# Patient Record
Sex: Female | Born: 1995 | Race: Black or African American | Hispanic: No | Marital: Single | Smoking: Never smoker
Health system: Southern US, Community
[De-identification: ages and names within clinical notes are randomized; demographics above are authoritative.]

## PROBLEM LIST (undated history)

## (undated) ENCOUNTER — Emergency Department (HOSPITAL_COMMUNITY): Admission: EM | Payer: Medicaid Other | Source: Home / Self Care

## (undated) ENCOUNTER — Inpatient Hospital Stay (HOSPITAL_COMMUNITY): Payer: Self-pay

## (undated) DIAGNOSIS — O98119 Syphilis complicating pregnancy, unspecified trimester: Secondary | ICD-10-CM

## (undated) DIAGNOSIS — A749 Chlamydial infection, unspecified: Secondary | ICD-10-CM

## (undated) DIAGNOSIS — I10 Essential (primary) hypertension: Secondary | ICD-10-CM

## (undated) DIAGNOSIS — R87612 Low grade squamous intraepithelial lesion on cytologic smear of cervix (LGSIL): Secondary | ICD-10-CM

## (undated) DIAGNOSIS — O10919 Unspecified pre-existing hypertension complicating pregnancy, unspecified trimester: Secondary | ICD-10-CM

## (undated) DIAGNOSIS — R87629 Unspecified abnormal cytological findings in specimens from vagina: Secondary | ICD-10-CM

## (undated) DIAGNOSIS — O139 Gestational [pregnancy-induced] hypertension without significant proteinuria, unspecified trimester: Secondary | ICD-10-CM

## (undated) HISTORY — DX: Syphilis complicating pregnancy, unspecified trimester: O98.119

## (undated) HISTORY — DX: Gestational (pregnancy-induced) hypertension without significant proteinuria, unspecified trimester: O13.9

## (undated) HISTORY — DX: Unspecified abnormal cytological findings in specimens from vagina: R87.629

## (undated) HISTORY — DX: Unspecified pre-existing hypertension complicating pregnancy, unspecified trimester: O10.919

## (undated) HISTORY — DX: Essential (primary) hypertension: I10

## (undated) HISTORY — DX: Low grade squamous intraepithelial lesion on cytologic smear of cervix (LGSIL): R87.612

## (undated) HISTORY — DX: Chlamydial infection, unspecified: A74.9

---
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
## 2016-08-04 ENCOUNTER — Emergency Department (HOSPITAL_COMMUNITY)
Admission: EM | Admit: 2016-08-04 | Discharge: 2016-08-04 | Disposition: A | Discharge status: Home / Self Care | Payer: Medicaid Other | Attending: Emergency Medicine | Admitting: Emergency Medicine

## 2016-08-04 ENCOUNTER — Emergency Department (HOSPITAL_COMMUNITY): Payer: Medicaid Other

## 2016-08-04 ENCOUNTER — Encounter (HOSPITAL_COMMUNITY): Payer: Self-pay | Admitting: Emergency Medicine

## 2016-08-04 DIAGNOSIS — J45909 Unspecified asthma, uncomplicated: Secondary | ICD-10-CM | POA: Diagnosis not present

## 2016-08-04 DIAGNOSIS — Z91010 Allergy to peanuts: Secondary | ICD-10-CM | POA: Insufficient documentation

## 2016-08-04 DIAGNOSIS — Y92410 Unspecified street and highway as the place of occurrence of the external cause: Secondary | ICD-10-CM | POA: Insufficient documentation

## 2016-08-04 DIAGNOSIS — Y999 Unspecified external cause status: Secondary | ICD-10-CM | POA: Diagnosis not present

## 2016-08-04 DIAGNOSIS — Y9389 Activity, other specified: Secondary | ICD-10-CM | POA: Diagnosis not present

## 2016-08-04 DIAGNOSIS — M545 Low back pain: Secondary | ICD-10-CM | POA: Diagnosis present

## 2016-08-04 DIAGNOSIS — R0789 Other chest pain: Secondary | ICD-10-CM | POA: Insufficient documentation

## 2016-08-04 LAB — I-STAT BETA HCG BLOOD, ED (MC, WL, AP ONLY)

## 2016-08-04 MED ORDER — IBUPROFEN 200 MG PO TABS
600.0000 mg | ORAL_TABLET | Freq: Once | ORAL | Status: AC
  Administered 2016-08-04: 600 mg via ORAL
  Filled 2016-08-04: qty 3

## 2016-08-04 MED ORDER — NAPROXEN 500 MG PO TABS: 500.0000 mg | ORAL_TABLET | Freq: Two times a day (BID) | ORAL | 0 refills | Status: DC

## 2016-08-04 MED ORDER — CYCLOBENZAPRINE HCL 5 MG PO TABS: 10.0000 mg | ORAL_TABLET | Freq: Three times a day (TID) | ORAL | 0 refills | Status: DC | PRN

## 2016-08-04 MED ORDER — CYCLOBENZAPRINE HCL 10 MG PO TABS
5.0000 mg | ORAL_TABLET | Freq: Once | ORAL | Status: AC
  Administered 2016-08-04: 5 mg via ORAL
  Filled 2016-08-04: qty 1

## 2016-08-04 MED ORDER — HYDROCODONE-ACETAMINOPHEN 5-325 MG PO TABS
1.0000 | ORAL_TABLET | Freq: Once | ORAL | Status: AC
  Administered 2016-08-04: 1 via ORAL
  Filled 2016-08-04: qty 1

## 2016-08-04 NOTE — Discharge Instructions (Signed)
Read the information below.  Your x-rays were re-assuring. You may have increasing muscle soreness over the next 2-3 days. I have prescribed naprosyn and flexeril. While taking naprosyn do not take motrin, ibuprofen, or aleve. The flexeril can make you drowsy, do not drive after taking.  Use the prescribed medication as directed.  Please discuss all new medications with your pharmacist.   Follow up with your primary doctor if symptoms persist.  You may return to the Emergency Department at any time for worsening condition or any new symptoms that concern you. Return to ED if you develop changes in vision, vomiting, confusion/lethargy, weakness, or loss of sensation or any other new or concerning sxs.
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
## 2016-10-09 ENCOUNTER — Encounter (HOSPITAL_COMMUNITY)
Admission: RE | Admit: 2016-10-09 | Discharge: 2016-10-09 | Disposition: A | Discharge status: Home / Self Care | Payer: Medicaid Other | Source: Ambulatory Visit | Attending: Oral Surgery | Admitting: Oral Surgery

## 2016-10-09 ENCOUNTER — Encounter (HOSPITAL_COMMUNITY): Payer: Self-pay

## 2016-10-09 DIAGNOSIS — K011 Impacted teeth: Secondary | ICD-10-CM | POA: Insufficient documentation

## 2016-10-09 DIAGNOSIS — Z01812 Encounter for preprocedural laboratory examination: Secondary | ICD-10-CM | POA: Insufficient documentation

## 2016-10-09 LAB — CBC
HEMATOCRIT: 38.4 % (ref 36.0–46.0)
HEMOGLOBIN: 12.7 g/dL (ref 12.0–15.0)
MCH: 28.7 pg (ref 26.0–34.0)
MCHC: 33.1 g/dL (ref 30.0–36.0)
MCV: 86.9 fL (ref 78.0–100.0)
Platelets: 269 10*3/uL (ref 150–400)
RBC: 4.42 MIL/uL (ref 3.87–5.11)
RDW: 13.2 % (ref 11.5–15.5)
WBC: 11.9 10*3/uL — AB (ref 4.0–10.5)

## 2016-10-09 LAB — HCG, SERUM, QUALITATIVE: Preg, Serum: NEGATIVE

## 2016-10-09 NOTE — H&P (Signed)
HISTORY AND PHYSICAL  Brandy Brock is a 20 y.o. female patient with CC: painful wisdom teeth  No diagnosis found.  Past Medical History:  Diagnosis Date  . Asthma     No current facility-administered medications for this encounter.    Current Outpatient Prescriptions  Medication Sig Dispense Refill  . cyclobenzaprine (FLEXERIL) 5 MG tablet Take 2 tablets (10 mg total) by mouth 3 (three) times daily as needed for muscle spasms. (Patient not taking: Reported on 10/01/2016) 15 tablet 0  . naproxen (NAPROSYN) 500 MG tablet Take 1 tablet (500 mg total) by mouth 2 (two) times daily. (Patient not taking: Reported on 10/01/2016) 16 tablet 0   Allergies  Allergen Reactions  . Peanut-Containing Drug Products Itching and Swelling    REACTION: Facial itching and swelling.   Active Problems:   * No active hospital problems. *  Vitals: There were no vitals taken for this visit. Lab results:No results found for this or any previous visit (from the past 80 hour(s)). Radiology Results: No results found. General appearance: alert, cooperative and morbidly obese Head: Normocephalic, without obvious abnormality, atraumatic Eyes: conjunctivae/corneas clear. PERRL, EOM's intact. Fundi benign. Nose: Nares normal. Septum midline. Mucosa normal. No drainage or sinus tenderness. Throat: lips, mucosa, and tongue normal; teeth and gums normal and impacted teeth 1, 16, 17, 32 Neck: no adenopathy, supple, symmetrical, trachea midline and thyroid not enlarged, symmetric, no tenderness/mass/nodules Resp: clear to auscultation bilaterally Cardio: regular rate and rhythm, S1, S2 normal, no murmur, click, rub or gallop  Assessment: Impacted teeth 1, 16, 17, 32, morbid obesity  Plan: extract teeth 1, 16, 17, 32. GA. Day surghery.   Gae Bon 10/09/2016

## 2016-10-09 NOTE — Pre-Procedure Instructions (Signed)
Brandy Brock  10/09/2016      [facility] [place], [place] - [address] [place] [address] Phone: [phone] Fax: [phone]  [id number] - [place], [place] [name] [place] [address] Phone: [phone] Fax: [phone]    Your procedure is scheduled on Friday December 1.  Report to [facility] Admitting at 6:45 A.M.  Call this number if you have problems the morning of surgery:  [phone]   Remember:  Do not eat food or drink liquids after midnight.  Take these medicines the morning of surgery with A SIP OF WATER: NONE  7 days prior to surgery STOP taking any Aspirin, Aleve, Naproxen, Ibuprofen, Motrin, Advil, Goody's, BC's, all herbal medications, fish oil, and all vitamins    Do not wear jewelry, make-up or nail polish.  Do not wear lotions, powders, or perfumes, or deoderant.  Do not shave 48 hours prior to surgery.  Men may shave face and neck.  Do not bring valuables to the hospital.  [facility] is not responsible for any belongings or valuables.  Contacts, dentures or bridgework may not be worn into surgery.  Leave your suitcase in the car.  After surgery it may be brought to your room.  For patients admitted to the hospital, discharge time will be determined by your treatment team.  Patients discharged the day of surgery will not be allowed to drive home.    Special instructions:    [facility]- Preparing For Surgery  Before surgery, you can play an important role. Because skin is not sterile, your skin needs to be as free of germs as possible. You can reduce the number of germs on your skin by washing with CHG (chlorahexidine gluconate) Soap before surgery.  CHG is an antiseptic cleaner which kills germs and bonds with the skin to continue killing germs even after washing.  Please do not use if you have an allergy to CHG or antibacterial soaps.  If your skin becomes reddened/irritated stop using the CHG.  Do not shave (including legs and underarms) for at least 48 hours prior to first CHG shower. It is OK to shave your face.  Please follow these instructions carefully.   1. Shower the NIGHT BEFORE SURGERY and the MORNING OF SURGERY with CHG.   2. If you chose to wash your hair, wash your hair first as usual with your normal shampoo.  3. After you shampoo, rinse your hair and body thoroughly to remove the shampoo.  4. Use CHG as you would any other liquid soap. You can apply CHG directly to the skin and wash gently with a scrungie or a clean washcloth.   5. Apply the CHG Soap to your body ONLY FROM THE NECK DOWN.  Do not use on open wounds or open sores. Avoid contact with your eyes, ears, mouth and genitals (private parts). Wash genitals (private parts) with your normal soap.  6. Wash thoroughly, paying special attention to the area where your surgery will be performed.  7. Thoroughly rinse your body with warm water from the neck down.  8. DO NOT shower/wash with your normal soap after using and rinsing off the CHG Soap.  9. Pat yourself dry with a CLEAN TOWEL.   10. Wear CLEAN PAJAMAS   11. Place CLEAN SHEETS on your bed the night of your first shower and DO NOT SLEEP WITH PETS.    Day of Surgery:  Do not apply any deodorants/lotions. Please wear clean clothes to the hospital/surgery center.

## 2016-10-09 NOTE — Progress Notes (Signed)
No PCP  denies cardiac hx, chest pain, SOB or signs of infection at PAT appointment.

## 2016-10-11 ENCOUNTER — Ambulatory Visit (HOSPITAL_COMMUNITY): Payer: Medicaid Other | Admitting: Certified Registered Nurse Anesthetist

## 2016-10-11 ENCOUNTER — Encounter (HOSPITAL_COMMUNITY)
Admission: RE | Disposition: A | Discharge status: Home / Self Care | Payer: Self-pay | Source: Ambulatory Visit | Attending: Oral Surgery

## 2016-10-11 ENCOUNTER — Encounter (HOSPITAL_COMMUNITY): Payer: Self-pay | Admitting: *Deleted

## 2016-10-11 ENCOUNTER — Ambulatory Visit (HOSPITAL_COMMUNITY)
Admission: RE | Admit: 2016-10-11 | Discharge: 2016-10-11 | Disposition: A | Discharge status: Home / Self Care | Payer: Medicaid Other | Source: Ambulatory Visit | Attending: Oral Surgery | Admitting: Oral Surgery

## 2016-10-11 DIAGNOSIS — J45909 Unspecified asthma, uncomplicated: Secondary | ICD-10-CM | POA: Insufficient documentation

## 2016-10-11 DIAGNOSIS — K011 Impacted teeth: Secondary | ICD-10-CM | POA: Insufficient documentation

## 2016-10-11 DIAGNOSIS — Z91010 Allergy to peanuts: Secondary | ICD-10-CM | POA: Diagnosis not present

## 2016-10-11 DIAGNOSIS — Z6841 Body Mass Index (BMI) 40.0 and over, adult: Secondary | ICD-10-CM | POA: Insufficient documentation

## 2016-10-11 HISTORY — PX: MULTIPLE EXTRACTIONS WITH ALVEOLOPLASTY: SHX5342

## 2016-10-11 SURGERY — MULTIPLE EXTRACTION WITH ALVEOLOPLASTY
Anesthesia: General | Site: Mouth

## 2016-10-11 MED ORDER — LIDOCAINE-EPINEPHRINE 2 %-1:100000 IJ SOLN
INTRAMUSCULAR | Status: AC
  Filled 2016-10-11: qty 1

## 2016-10-11 MED ORDER — PROPOFOL 10 MG/ML IV BOLUS
INTRAVENOUS | Status: AC
  Filled 2016-10-11: qty 20

## 2016-10-11 MED ORDER — HYDROCODONE-ACETAMINOPHEN 5-325 MG PO TABS: 1.0000 | ORAL_TABLET | Freq: Four times a day (QID) | ORAL | 0 refills | Status: DC | PRN

## 2016-10-11 MED ORDER — ONDANSETRON HCL 4 MG/2ML IJ SOLN
INTRAMUSCULAR | Status: DC | PRN
  Administered 2016-10-11: 4 mg via INTRAVENOUS

## 2016-10-11 MED ORDER — DEXMEDETOMIDINE HCL 200 MCG/2ML IV SOLN
INTRAVENOUS | Status: DC | PRN
  Administered 2016-10-11: 12 ug via INTRAVENOUS

## 2016-10-11 MED ORDER — LACTATED RINGERS IV SOLN
INTRAVENOUS | Status: DC
  Administered 2016-10-11: 08:00:00 via INTRAVENOUS

## 2016-10-11 MED ORDER — FENTANYL CITRATE (PF) 100 MCG/2ML IJ SOLN
INTRAMUSCULAR | Status: DC | PRN
  Administered 2016-10-11 (×2): 100 ug via INTRAVENOUS

## 2016-10-11 MED ORDER — OXYMETAZOLINE HCL 0.05 % NA SOLN
NASAL | Status: AC
  Filled 2016-10-11: qty 15

## 2016-10-11 MED ORDER — MIDAZOLAM HCL 2 MG/2ML IJ SOLN
INTRAMUSCULAR | Status: AC
  Filled 2016-10-11: qty 2

## 2016-10-11 MED ORDER — LIDOCAINE HCL (CARDIAC) 20 MG/ML IV SOLN
INTRAVENOUS | Status: DC | PRN
  Administered 2016-10-11: 100 mg via INTRAVENOUS

## 2016-10-11 MED ORDER — FENTANYL CITRATE (PF) 100 MCG/2ML IJ SOLN
INTRAMUSCULAR | Status: AC
  Filled 2016-10-11: qty 2

## 2016-10-11 MED ORDER — SUCCINYLCHOLINE CHLORIDE 20 MG/ML IJ SOLN
INTRAMUSCULAR | Status: DC | PRN
  Administered 2016-10-11: 200 mg via INTRAVENOUS

## 2016-10-11 MED ORDER — LIDOCAINE-EPINEPHRINE 2 %-1:100000 IJ SOLN
INTRAMUSCULAR | Status: DC | PRN
  Administered 2016-10-11: 16 mL

## 2016-10-11 MED ORDER — 0.9 % SODIUM CHLORIDE (POUR BTL) OPTIME
TOPICAL | Status: DC | PRN
  Administered 2016-10-11: 1000 mL

## 2016-10-11 MED ORDER — PROPOFOL 10 MG/ML IV BOLUS
INTRAVENOUS | Status: DC | PRN
  Administered 2016-10-11: 150 mg via INTRAVENOUS
  Administered 2016-10-11: 50 mg via INTRAVENOUS

## 2016-10-11 MED ORDER — MIDAZOLAM HCL 5 MG/5ML IJ SOLN
INTRAMUSCULAR | Status: DC | PRN
  Administered 2016-10-11: 2 mg via INTRAVENOUS

## 2016-10-11 SURGICAL SUPPLY — 32 items
BUR CROSS CUT FISSURE 1.6 (BURR) ×2 IMPLANT
BUR CROSS CUT FISSURE 1.6MM (BURR) ×1
BUR EGG ELITE 4.0 (BURR) ×2 IMPLANT
BUR EGG ELITE 4.0MM (BURR) ×1
CANISTER SUCTION 2500CC (MISCELLANEOUS) ×3 IMPLANT
COVER SURGICAL LIGHT HANDLE (MISCELLANEOUS) ×3 IMPLANT
CRADLE DONUT ADULT HEAD (MISCELLANEOUS) ×3 IMPLANT
DECANTER SPIKE VIAL GLASS SM (MISCELLANEOUS) IMPLANT
DRAPE U-SHAPE 76X120 STRL (DRAPES) IMPLANT
FLUID NSS /IRRIG 1000 ML XXX (MISCELLANEOUS) ×3 IMPLANT
GAUZE PACKING FOLDED 2  STR (GAUZE/BANDAGES/DRESSINGS) ×2
GAUZE PACKING FOLDED 2 STR (GAUZE/BANDAGES/DRESSINGS) ×1 IMPLANT
GLOVE BIO SURGEON STRL SZ 6.5 (GLOVE) ×2 IMPLANT
GLOVE BIO SURGEON STRL SZ7.5 (GLOVE) ×3 IMPLANT
GLOVE BIO SURGEONS STRL SZ 6.5 (GLOVE) ×1
GLOVE BIOGEL PI IND STRL 7.0 (GLOVE) ×1 IMPLANT
GLOVE BIOGEL PI INDICATOR 7.0 (GLOVE) ×2
GOWN STRL REUS W/ TWL LRG LVL3 (GOWN DISPOSABLE) ×1 IMPLANT
GOWN STRL REUS W/ TWL XL LVL3 (GOWN DISPOSABLE) ×1 IMPLANT
GOWN STRL REUS W/TWL LRG LVL3 (GOWN DISPOSABLE) ×3
GOWN STRL REUS W/TWL XL LVL3 (GOWN DISPOSABLE) ×3
KIT BASIN OR (CUSTOM PROCEDURE TRAY) ×3 IMPLANT
KIT ROOM TURNOVER OR (KITS) ×3 IMPLANT
NEEDLE 22X1 1/2 (OR ONLY) (NEEDLE) ×6 IMPLANT
NS IRRIG 1000ML POUR BTL (IV SOLUTION) ×3 IMPLANT
PAD ARMBOARD 7.5X6 YLW CONV (MISCELLANEOUS) ×3 IMPLANT
SUT CHROMIC 3 0 PS 2 (SUTURE) ×6 IMPLANT
SYR CONTROL 10ML LL (SYRINGE) ×3 IMPLANT
TOWEL OR 17X26 10 PK STRL BLUE (TOWEL DISPOSABLE) ×3 IMPLANT
TRAY ENT MC OR (CUSTOM PROCEDURE TRAY) ×3 IMPLANT
TUBING IRRIGATION (MISCELLANEOUS) ×3 IMPLANT
YANKAUER SUCT BULB TIP NO VENT (SUCTIONS) ×3 IMPLANT

## 2016-10-11 NOTE — Transfer of Care (Signed)
Immediate Anesthesia Transfer of Care Note  Patient: Brandy Brock  Procedure(s) Performed: Procedure(s): MULTIPLE EXTRACTION WITH ALVEOLOPLASTY (N/A)  Patient Location: PACU  Anesthesia Type:General  Level of Consciousness: awake, alert , oriented and patient cooperative  Airway & Oxygen Therapy: Patient Spontanous Breathing and Patient connected to face mask oxygen  Post-op Assessment: Report given to RN and Post -op Vital signs reviewed and stable  Post vital signs: Reviewed and stable  Last Vitals:  Vitals:   10/11/16 0711 10/11/16 0941  BP: (!) 157/89   Pulse: 77 (!) 108  Resp: 20 (!) 24  Temp: 36.7 C 36.2 C    Last Pain:  Vitals:   10/11/16 0941  TempSrc:   PainSc: 0-No pain         Complications: No apparent anesthesia complications

## 2016-10-11 NOTE — Anesthesia Procedure Notes (Addendum)
Procedure Name: MAC Date/Time: 10/11/2016 8:54 AM Performed by: Shirlyn Goltz Pre-anesthesia Checklist: Patient identified, Emergency Drugs available, Suction available and Patient being monitored Patient Re-evaluated:Patient Re-evaluated prior to inductionOxygen Delivery Method: Circle system utilized Preoxygenation: Pre-oxygenation with 100% oxygen Intubation Type: IV induction Ventilation: Mask ventilation without difficulty Laryngoscope Size: Mac and 3 Grade View: Grade I Tube type: Oral Tube size: 7.0 mm Number of attempts: 1 Placement Confirmation: ETT inserted through vocal cords under direct vision and breath sounds checked- equal and bilateral Secured at: 22 cm Tube secured with: Tape Dental Injury: Teeth and Oropharynx as per pre-operative assessment

## 2016-10-11 NOTE — Anesthesia Postprocedure Evaluation (Signed)
Anesthesia Post Note  Patient: Brandy Brock  Procedure(s) Performed: Procedure(s) (LRB): MULTIPLE EXTRACTION WITH ALVEOLOPLASTY (N/A)  Patient location during evaluation: PACU Anesthesia Type: General Level of consciousness: awake and alert Pain management: pain level controlled Vital Signs Assessment: post-procedure vital signs reviewed and stable Respiratory status: spontaneous breathing, nonlabored ventilation, respiratory function stable and patient connected to nasal cannula oxygen Cardiovascular status: blood pressure returned to baseline and stable Postop Assessment: no signs of nausea or vomiting Anesthetic complications: no    Last Vitals:  Vitals:   10/11/16 1030 10/11/16 1042  BP: (!) 136/91 (!) 135/92  Pulse: 94 89  Resp: 14   Temp: 36.4 C     Last Pain:  Vitals:   10/11/16 1030  TempSrc:   PainSc: 0-No pain                 Felipe Paluch DAVID

## 2016-10-11 NOTE — Anesthesia Preprocedure Evaluation (Signed)
Anesthesia Evaluation  Patient identified by MRN, date of birth, ID band Patient awake    Reviewed: Allergy & Precautions, NPO status , Patient's Chart, lab work & pertinent test results  Airway Mallampati: I  TM Distance: >3 FB Neck ROM: Full    Dental   Pulmonary asthma ,    Pulmonary exam normal        Cardiovascular Normal cardiovascular exam     Neuro/Psych    GI/Hepatic   Endo/Other    Renal/GU      Musculoskeletal   Abdominal   Peds  Hematology   Anesthesia Other Findings   Reproductive/Obstetrics                             Anesthesia Physical Anesthesia Plan  ASA: II  Anesthesia Plan: General   Post-op Pain Management:    Induction: Intravenous  Airway Management Planned: Oral ETT  Additional Equipment:   Intra-op Plan:   Post-operative Plan: Extubation in OR  Informed Consent: I have reviewed the patients History and Physical, chart, labs and discussed the procedure including the risks, benefits and alternatives for the proposed anesthesia with the patient or authorized representative who has indicated his/her understanding and acceptance.     Plan Discussed with: CRNA and Surgeon  Anesthesia Plan Comments:         Anesthesia Quick Evaluation

## 2016-10-11 NOTE — H&P (Signed)
H&P documentation  -History and Physical Reviewed  -Patient has been re-examined  -No change in the plan of care  Brandy Brock M  

## 2016-10-11 NOTE — Op Note (Signed)
10/11/2016  9:20 AM  PATIENT:  Brandy Brock  20 y.o. female  PRE-OPERATIVE DIAGNOSIS:  Impacted teeth # 1, 16, 17, 32  POST-OPERATIVE DIAGNOSIS:  SAME  PROCEDURE:  Procedure(s): Extraction teeth 1, 16, 17, 32  SURGEON:  Surgeon(s): Diona Browner, DDS  ANESTHESIA:   local and general  EBL:  minimal  DRAINS: none   SPECIMEN:  No Specimen  COUNTS:  YES  PLAN OF CARE: Discharge to home after PACU  PATIENT DISPOSITION:  PACU - hemodynamically stable.   PROCEDURE DETAILS: Dictation # [id number]  Gae Bon, DMD 10/11/2016 9:20 AM

## 2016-10-12 ENCOUNTER — Encounter (HOSPITAL_COMMUNITY): Payer: Self-pay | Admitting: Oral Surgery

## 2016-10-14 NOTE — Op Note (Signed)
NAMECALIXTA, Brandy Brock NO.:  [id number]  MEDICAL RECORD NO.:  [id number]  LOCATION:                                 FACILITY:  PHYSICIAN:  Gae Bon, M.D.  DATE OF BIRTH:  [birth year]  DATE OF PROCEDURE:  10/11/2016 DATE OF DISCHARGE:                              OPERATIVE REPORT   PREOPERATIVE DIAGNOSIS:  Impacted wisdom teeth numbers 1, 16, 17, 32.  POSTOPERATIVE DIAGNOSIS:  Impacted wisdom teeth numbers 1, 16, 17, 32.  PROCEDURE:  Extraction of teeth #1, #16, #17, #32.  SURGEON:  Gae Bon, M.D.  ANESTHESIA:  General oral intubation.  Dr. Conrad [place], attending.  DESCRIPTION OF PROCEDURE:  The patient was taken to the operating room, placed on the table in supine position.  General anesthesia was administered intravenously and oral endotracheal tube was placed and secured.  The eyes were protected.  The patient was draped for the procedure.  Time-out was performed.  The posterior pharynx was suctioned.  A throat pack was placed.  A 2% lidocaine with 1:100,000 epinephrine was infiltrated in an inferior alveolar block on the right and left side and in buccal and palatal infiltration in the maxilla posteriorly around teeth numbers 1 and 16.  A total of 16 mL of anesthetic solution was utilized.  A bite block was placed in the right side of the mouth and a sweetheart was used to retract the tongue.  A 15 blade was used to make incisions over teeth numbers 16 and 17.  The periosteum was reflected to expose the bone and the teeth.  Then, bone was removed from around the teeth using a Stryker handpiece under irrigation.  Tooth #17 was luxated, but was not removed.  The tooth required additional sectioning with a Stryker handpiece to remove the tooth in pieces with a 301 elevator.  The maxillary tooth was elevated with a 301 elevator.  Both teeth having been removed, then the sockets were curetted, irrigated, and closed with 3-0 chromic.  The  endotracheal tube was repositioned to the left side of the mouth and retractor and bite block were then also repositioned left side as well.  Access was then available for the teeth numbers 1 and 32.  The 15 blade was used to make an incision overlying the teeth, then the periosteum was reflected to expose the teeth and bone.  The Stryker handpiece was used to remove bone overlying teeth numbers 1 and 32.  Tooth #32 required additional suctioning and was removed with the 301 elevator and then the [facility] elevator was used to remove the mesial roots.  In the maxilla, the 301 elevator was used to elevate and extract the tooth.  The sockets were curetted, irrigated, and closed with 3-0 chromic.  The oral cavity was then irrigated and suctioned.  Throat pack was removed.  The patient was awakened, taken to the recovery room, breathing spontaneously in good condition.  ESTIMATED BLOOD LOSS:  Minimal.  COMPLICATIONS:  None.  SPECIMENS:  None.     Gae Bon, M.D.   ______________________________ Gae Bon, M.D.    SMJ/MEDQ  D:  10/11/2016  T:  10/12/2016  Job:  [id number] 

## 2017-03-04 ENCOUNTER — Encounter (HOSPITAL_COMMUNITY): Payer: Self-pay | Admitting: Family Medicine

## 2017-03-04 ENCOUNTER — Ambulatory Visit (HOSPITAL_COMMUNITY)
Admission: EM | Admit: 2017-03-04 | Discharge: 2017-03-04 | Disposition: A | Discharge status: Home / Self Care | Payer: Medicaid Other | Attending: Internal Medicine | Admitting: Internal Medicine

## 2017-03-04 DIAGNOSIS — J302 Other seasonal allergic rhinitis: Secondary | ICD-10-CM

## 2017-03-04 MED ORDER — MONTELUKAST SODIUM 10 MG PO TABS: 10.0000 mg | ORAL_TABLET | Freq: Every day | ORAL | 2 refills | Status: DC

## 2017-03-04 NOTE — Discharge Instructions (Signed)
You have seasonal allergies, recommend over-the-counter Claritin or Zyrtec every day for the remainder of the season, Flonase 2 sprays each nostril every day as well, started on a medicine called Singulair, take one tablet every night at bedtime.

## 2017-03-04 NOTE — ED Triage Notes (Signed)
Pt here for congestion and sore throat x 1 week.Marland Kitchen

## 2017-03-04 NOTE — ED Provider Notes (Signed)
CSN: [id number]     Arrival date & time 03/04/17  1132 History   None    Chief Complaint  Patient presents with  . Nasal Congestion   (Consider location/radiation/quality/duration/timing/severity/associated sxs/prior Treatment) The history is provided by the patient.  URI  Presenting symptoms: congestion, cough, rhinorrhea and sore throat   Presenting symptoms: no fatigue and no fever   Congestion:    Location:  Nasal   Interferes with sleep: no     Interferes with eating/drinking: no   Cough:    Cough characteristics:  Non-productive, dry and hacking   Sputum characteristics:  Clear   Severity:  Moderate   Onset quality:  Gradual   Duration:  2 weeks   Timing:  Constant Rhinorrhea:    Quality:  Clear   Severity:  Moderate   Duration:  2 weeks   Timing:  Constant   Progression:  Unchanged Severity:  Mild Onset quality:  Gradual Duration:  2 weeks Timing:  Constant Chronicity:  New Relieved by:  None tried Worsened by:  Nothing Ineffective treatments:  None tried Associated symptoms: sneezing   Associated symptoms: no sinus pain and no wheezing   Risk factors: sick contacts     Past Medical History:  Diagnosis Date  . Asthma    childhood   Past Surgical History:  Procedure Laterality Date  . MULTIPLE EXTRACTIONS WITH ALVEOLOPLASTY N/A 10/11/2016   Procedure: MULTIPLE EXTRACTION WITH ALVEOLOPLASTY;  Surgeon: Diona Browner, DDS;  Location: [place];  Service: Oral Surgery;  Laterality: N/A;   History reviewed. No pertinent family history. Social History  Substance Use Topics  . Smoking status: Never Smoker  . Smokeless tobacco: Former Systems developer  . Alcohol use No   OB History    No data available     Review of Systems  Constitutional: Negative for chills, fatigue and fever.  HENT: Positive for congestion, rhinorrhea, sneezing and sore throat. Negative for sinus pain and sinus pressure.   Eyes: Positive for itching. Negative for pain and redness.  Respiratory:  Positive for cough. Negative for shortness of breath and wheezing.   Cardiovascular: Negative.   Gastrointestinal: Negative for constipation, diarrhea, nausea and vomiting.  Genitourinary: Negative.   Musculoskeletal: Negative.   Skin: Negative.   Neurological: Negative.     Allergies  Peanut-containing drug products  Home Medications   Prior to Admission medications   Medication Sig Start Date End Date Taking? Authorizing Provider  montelukast (SINGULAIR) 10 MG tablet Take 1 tablet (10 mg total) by mouth at bedtime. 03/04/17   Barnet Glasgow, NP   Meds Ordered and Administered this Visit  Medications - No data to display  BP (!) 143/82   Pulse 94   Temp 98.4 F (36.9 C)   Resp 18   LMP 03/04/2017   SpO2 96%  No data found.   Physical Exam  Constitutional: She is oriented to person, place, and time. She appears well-developed and well-nourished. No distress.  HENT:  Head: Normocephalic and atraumatic.  Right Ear: Tympanic membrane and external ear normal.  Left Ear: Tympanic membrane and external ear normal.  Nose: Mucosal edema and rhinorrhea present. Right sinus exhibits no maxillary sinus tenderness and no frontal sinus tenderness. Left sinus exhibits no maxillary sinus tenderness and no frontal sinus tenderness.  Eyes: Conjunctivae and lids are normal. Right eye exhibits no discharge. Left eye exhibits no discharge.  Neck: Normal range of motion.  Cardiovascular: Normal rate and regular rhythm.   Pulmonary/Chest: Effort normal and breath sounds  normal.  Abdominal: Soft. Bowel sounds are normal.  Neurological: She is alert and oriented to person, place, and time.  Skin: Skin is warm and dry. Capillary refill takes less than 2 seconds. She is not diaphoretic.  Psychiatric: She has a normal mood and affect. Her behavior is normal.  Nursing note and vitals reviewed.   Urgent Care Course     Procedures (including critical care time)  Labs Review Labs Reviewed -  No data to display  Imaging Review No results found.   MDM   1. Seasonal allergic rhinitis, unspecified trigger    Treating for seasonal allergies, recommend over-the-counter Flonase, Zyrtec, daily given prescription for Singulair, follow-up with primary care return to clinic as needed.      Barnet Glasgow, NP 03/04/17 1250

## 2017-07-01 ENCOUNTER — Ambulatory Visit (HOSPITAL_COMMUNITY)
Admission: EM | Admit: 2017-07-01 | Discharge: 2017-07-01 | Disposition: A | Discharge status: Home / Self Care | Payer: Medicaid Other | Attending: Family Medicine | Admitting: Family Medicine

## 2017-07-01 ENCOUNTER — Encounter (HOSPITAL_COMMUNITY): Payer: Self-pay | Admitting: Emergency Medicine

## 2017-07-01 DIAGNOSIS — J069 Acute upper respiratory infection, unspecified: Secondary | ICD-10-CM

## 2017-07-01 DIAGNOSIS — B9789 Other viral agents as the cause of diseases classified elsewhere: Secondary | ICD-10-CM | POA: Diagnosis not present

## 2017-07-01 MED ORDER — BENZONATATE 100 MG PO CAPS: 100.0000 mg | ORAL_CAPSULE | Freq: Three times a day (TID) | ORAL | 0 refills | Status: DC

## 2017-07-01 MED ORDER — FLUTICASONE PROPIONATE 50 MCG/ACT NA SUSP: 2.0000 | Freq: Every day | NASAL | 0 refills | Status: DC

## 2017-07-01 MED ORDER — CETIRIZINE-PSEUDOEPHEDRINE ER 5-120 MG PO TB12: 1.0000 | ORAL_TABLET | Freq: Every day | ORAL | 0 refills | Status: DC

## 2017-07-01 NOTE — ED Provider Notes (Signed)
[facility]    CSN: [id number] Arrival date & time: 07/01/17  5366     History   Chief Complaint Chief Complaint  Patient presents with  . URI    HPI Brandy Brock is a 21 y.o. female.   21 year old female with history of childhood asthma comes in for one-week history of sore throat, cough, nasal congestion, nosebleeds. Patient states she now has had chest pain while coughing, with shortness of breath. She has taken some ibuprofen for the pain without relief. She has had 2-3 episodes of nosebleeds, which were controlled with pressure. Denies headache, ear pain, eye pain. Denies abdominal pain, nausea, vomiting, diarrhea, constipation. Had a fever with Tmax of 101. Denies chills or night sweats. Denies wheezing, and has not needed use of inhaler. Positive sick contact.      Past Medical History:  Diagnosis Date  . Asthma    childhood    Patient Active Problem List   Diagnosis Date Noted  . Obesity, unspecified 10/13/2008  . ASTHMA, PERSISTENT 10/13/2008    Past Surgical History:  Procedure Laterality Date  . MULTIPLE EXTRACTIONS WITH ALVEOLOPLASTY N/A 10/11/2016   Procedure: MULTIPLE EXTRACTION WITH ALVEOLOPLASTY;  Surgeon: Diona Browner, DDS;  Location: [facility];  Service: Oral Surgery;  Laterality: N/A;    OB History    No data available       Home Medications    Prior to Admission medications   Medication Sig Start Date End Date Taking? Authorizing Provider  benzonatate (TESSALON) 100 MG capsule Take 1 capsule (100 mg total) by mouth every 8 (eight) hours. 07/01/17   Tasia Catchings, Lattie Cervi V, PA-C  cetirizine-pseudoephedrine (ZYRTEC-D) 5-120 MG tablet Take 1 tablet by mouth daily. 07/01/17   Tasia Catchings, Anabell Swint V, PA-C  fluticasone (FLONASE) 50 MCG/ACT nasal spray Place 2 sprays into both nostrils daily. 07/01/17   Ok Edwards, PA-C    Family History No family history on file.  Social History Social History  Substance Use Topics  . Smoking status: Never Smoker  . Smokeless  tobacco: Former Systems developer  . Alcohol use No     Allergies   Peanut-containing drug products   Review of Systems Review of Systems  Reason unable to perform ROS: see HPI as above.     Physical Exam Triage Vital Signs ED Triage Vitals  Enc Vitals Group     BP 07/01/17 1011 (!) 153/95     Pulse Rate 07/01/17 1011 98     Resp 07/01/17 1011 16     Temp 07/01/17 1011 97.9 F (36.6 C)     Temp Source 07/01/17 1011 Oral     SpO2 07/01/17 1011 98 %     Weight 07/01/17 1011 (!) 375 lb (170.1 kg)     Height 07/01/17 1011 5\' 6"  (1.676 m)     Head Circumference --      Peak Flow --      Pain Score 07/01/17 1012 7     Pain Loc --      Pain Edu? --      Excl. in [place]? --    No data found.   Updated Vital Signs BP (!) 153/95 (BP Location: Left Wrist)   Pulse 98   Temp 97.9 F (36.6 C) (Oral)   Resp 16   Ht 5\' 6"  (1.676 m)   Wt (!) 350 lb (158.8 kg)   SpO2 98%   BMI 56.49 kg/m    Physical Exam  Constitutional: She is oriented to person,  place, and time. She appears well-developed and well-nourished. No distress.  HENT:  Head: Normocephalic and atraumatic.  Right Ear: External ear and ear canal normal. Tympanic membrane is not erythematous and not bulging. A middle ear effusion is present.  Left Ear: External ear and ear canal normal. Tympanic membrane is not erythematous and not bulging. A middle ear effusion is present.  Nose: Mucosal edema and rhinorrhea present. Right sinus exhibits no maxillary sinus tenderness and no frontal sinus tenderness. Left sinus exhibits no maxillary sinus tenderness and no frontal sinus tenderness.  Mouth/Throat: Uvula is midline and mucous membranes are normal. Posterior oropharyngeal erythema present.  Eyes: Pupils are equal, round, and reactive to light. Conjunctivae are normal.  Neck: Normal range of motion. Neck supple.  Cardiovascular: Normal rate, regular rhythm and normal heart sounds.  Exam reveals no gallop and no friction rub.   No  murmur heard. Pulmonary/Chest: Effort normal and breath sounds normal. She has no decreased breath sounds. She has no wheezes. She has no rhonchi. She has no rales.  Lymphadenopathy:    She has no cervical adenopathy.  Neurological: She is alert and oriented to person, place, and time.  Skin: Skin is warm and dry.  Psychiatric: She has a normal mood and affect. Her behavior is normal. Judgment normal.     UC Treatments / Results  Labs (all labs ordered are listed, but only abnormal results are displayed) Labs Reviewed - No data to display  EKG  EKG Interpretation None       Radiology No results found.  Procedures Procedures (including critical care time)  Medications Ordered in UC Medications - No data to display   Initial Impression / Assessment and Plan / UC Course  I have reviewed the triage vital signs and the nursing notes.  Pertinent labs & imaging results that were available during my care of the patient were reviewed by me and considered in my medical decision making (see chart for details).    Discussed with patient history and exam most consistent with viral URI. Lung exam normal without adventitious lung sounds today. Will treat symptomatically. Return precautions given.  Final Clinical Impressions(s) / UC Diagnoses   Final diagnoses:  Viral URI with cough    New Prescriptions New Prescriptions   BENZONATATE (TESSALON) 100 MG CAPSULE    Take 1 capsule (100 mg total) by mouth every 8 (eight) hours.   CETIRIZINE-PSEUDOEPHEDRINE (ZYRTEC-D) 5-120 MG TABLET    Take 1 tablet by mouth daily.   FLUTICASONE (FLONASE) 50 MCG/ACT NASAL SPRAY    Place 2 sprays into both nostrils daily.      Ok Edwards, PA-C 07/01/17 1032

## 2017-07-01 NOTE — ED Triage Notes (Signed)
PT reports nasal congestion, cough, sore throat for 1 week. PT reports she had multiple nose bleeds yesterday. PT has not used any nasal sprays.

## 2017-07-01 NOTE — Discharge Instructions (Signed)
Start Tessalon for cough. Flonase and Zyrtec-D for nasal congestion. Can use over-the-counter nasal saline rinse such as neti pot for nasal congestion. Humidifier or steam showers for nosebleeds. Avoid heavy blowing of the nose. If he experience another nosebleed, lean forward and apply pressure to her nose. Monitor for any worsening of symptoms, shortness of breath, wheezing, worsening trouble breathing, chest pain, follow-up for reevaluation.

## 2017-08-07 ENCOUNTER — Encounter (HOSPITAL_COMMUNITY): Payer: Self-pay | Admitting: Emergency Medicine

## 2017-08-07 ENCOUNTER — Ambulatory Visit (INDEPENDENT_AMBULATORY_CARE_PROVIDER_SITE_OTHER): Payer: Medicaid Other

## 2017-08-07 ENCOUNTER — Other Ambulatory Visit: Payer: Self-pay

## 2017-08-07 ENCOUNTER — Ambulatory Visit (HOSPITAL_COMMUNITY)
Admission: EM | Admit: 2017-08-07 | Discharge: 2017-08-07 | Disposition: A | Discharge status: Home / Self Care | Payer: Medicaid Other | Attending: Family Medicine | Admitting: Family Medicine

## 2017-08-07 DIAGNOSIS — R0789 Other chest pain: Secondary | ICD-10-CM | POA: Diagnosis not present

## 2017-08-07 DIAGNOSIS — Z3202 Encounter for pregnancy test, result negative: Secondary | ICD-10-CM | POA: Diagnosis not present

## 2017-08-07 DIAGNOSIS — R0602 Shortness of breath: Secondary | ICD-10-CM

## 2017-08-07 DIAGNOSIS — R05 Cough: Secondary | ICD-10-CM | POA: Diagnosis not present

## 2017-08-07 LAB — POCT PREGNANCY, URINE: PREG TEST UR: NEGATIVE

## 2017-08-07 MED ORDER — AZITHROMYCIN 250 MG PO TABS: 250.0000 mg | ORAL_TABLET | Freq: Every day | ORAL | 0 refills | Status: DC

## 2017-08-07 NOTE — ED Triage Notes (Signed)
Pt reports right sided CP that started 5 days ago.  She describes it as sharp pain when she breathes in.  Yesterday she began having some SOB as well.

## 2017-08-07 NOTE — Discharge Instructions (Signed)
Please return in 48 hours for recheck, sooner if needed.

## 2017-08-12 NOTE — ED Provider Notes (Signed)
  [facility]   [id number] 08/07/17 Arrival Time: 71  ASSESSMENT & PLAN:  1. Shortness of breath    Given CXR will treat with: Meds ordered this encounter  Medications  . azithromycin (ZITHROMAX) 250 MG tablet    Sig: Take 1 tablet (250 mg total) by mouth daily. Take first 2 tablets together, then 1 every day until finished.    Dispense:  6 tablet    Refill:  0   Recommend f/u in 4\8 hours if not improving, sooner if needed. Reviewed expectations re: course of current medical issues. Questions answered. Outlined signs and symptoms indicating need for more acute intervention. Patient verbalized understanding. After Visit Summary given.   SUBJECTIVE:  Brandy Brock is a 21 y.o. female who presents with complaint of cough and SOB for the past several days. Cold symptoms greater than one week. Occasional sharp pain in anterior chest with inspiration. No wheezing.  Former smoker. Cough bothering her the most. Just lingering. No current SOB today. OTC medications without much relief. Unsure of fever. No n/v.  ROS: As per HPI.   OBJECTIVE:  Vitals:   08/07/17 1400  BP: (!) 151/81  Pulse: 90  Resp: 18  Temp: 98.3 F (36.8 C)  TempSrc: Oral  SpO2: 100%    General appearance: alert; no distress Eyes: PERRLA; EOMI; conjunctiva normal HENT: normocephalic; atraumatic; mild nasal congestion Neck: supple Lungs: clear to auscultation bilaterally; persistent dry cough Heart: regular rate and rhythm Extremities: no cyanosis or edema; symmetrical with no gross deformities Skin: warm and dry Psychological: alert and cooperative; normal mood and affect  Labs: Results for orders placed or performed during the hospital encounter of 08/07/17  Pregnancy, urine POC  Result Value Ref Range   Preg Test, Ur NEGATIVE NEGATIVE   Labs Reviewed  POCT PREGNANCY, URINE    Imaging: Dg Chest 2 View  Result Date: 08/07/2017 CLINICAL DATA:  Chest pain for 5 days radiating to  RIGHT shoulder, shortness of breath, nausea, history chronic asthma EXAM: CHEST  2 VIEW COMPARISON:  08/04/2016 FINDINGS: Upper normal heart size. Mediastinal contours and pulmonary vascularity normal. Atelectasis versus infiltrate in RIGHT middle lobe. Remaining lungs clear. No pleural effusion or pneumothorax. Bones unremarkable. IMPRESSION: Atelectasis versus infiltrate RIGHT middle lobe. Electronically Signed   By: Lavonia Dana M.D.   On: 08/07/2017 14:56    Allergies  Allergen Reactions  . Peanut-Containing Drug Products Itching and Swelling    REACTION: Facial itching and swelling.    Past Medical History:  Diagnosis Date  . Asthma    childhood   Social History   Social History  . Marital status: Single    Spouse name: N/A  . Number of children: N/A  . Years of education: N/A   Occupational History  . Not on file.   Social History Main Topics  . Smoking status: Never Smoker  . Smokeless tobacco: Former Systems developer  . Alcohol use No  . Drug use: No  . Sexual activity: Not on file   Other Topics Concern  . Not on file   Social History Narrative  . No narrative on file   History reviewed. No pertinent family history. Past Surgical History:  Procedure Laterality Date  . MULTIPLE EXTRACTIONS WITH ALVEOLOPLASTY N/A 10/11/2016   Procedure: MULTIPLE EXTRACTION WITH ALVEOLOPLASTY;  Surgeon: Diona Browner, DDS;  Location: [facility];  Service: Oral Surgery;  Laterality: N/AVanessa Kick, MD 08/12/17 [phone]

## 2018-02-12 ENCOUNTER — Ambulatory Visit (INDEPENDENT_AMBULATORY_CARE_PROVIDER_SITE_OTHER): Payer: Self-pay

## 2018-02-12 ENCOUNTER — Encounter (HOSPITAL_COMMUNITY): Payer: Self-pay | Admitting: Family Medicine

## 2018-02-12 ENCOUNTER — Emergency Department (HOSPITAL_COMMUNITY)
Admission: EM | Admit: 2018-02-12 | Discharge: 2018-02-12 | Disposition: A | Discharge status: Home / Self Care | Payer: Medicaid Other

## 2018-02-12 ENCOUNTER — Other Ambulatory Visit: Payer: Self-pay

## 2018-02-12 ENCOUNTER — Ambulatory Visit (HOSPITAL_COMMUNITY)
Admission: EM | Admit: 2018-02-12 | Discharge: 2018-02-12 | Disposition: A | Discharge status: Home / Self Care | Payer: Self-pay | Attending: Family Medicine | Admitting: Family Medicine

## 2018-02-12 DIAGNOSIS — J4521 Mild intermittent asthma with (acute) exacerbation: Secondary | ICD-10-CM

## 2018-02-12 DIAGNOSIS — R0602 Shortness of breath: Secondary | ICD-10-CM

## 2018-02-12 DIAGNOSIS — Z3202 Encounter for pregnancy test, result negative: Secondary | ICD-10-CM

## 2018-02-12 DIAGNOSIS — R51 Headache: Secondary | ICD-10-CM

## 2018-02-12 MED ORDER — IPRATROPIUM-ALBUTEROL 0.5-2.5 (3) MG/3ML IN SOLN
3.0000 mL | Freq: Once | RESPIRATORY_TRACT | Status: AC
  Administered 2018-02-12: 3 mL via RESPIRATORY_TRACT

## 2018-02-12 MED ORDER — PREDNISONE 20 MG PO TABS: 40.0000 mg | ORAL_TABLET | Freq: Every day | ORAL | 0 refills | Status: AC

## 2018-02-12 MED ORDER — IPRATROPIUM-ALBUTEROL 0.5-2.5 (3) MG/3ML IN SOLN
RESPIRATORY_TRACT | Status: AC
  Filled 2018-02-12: qty 3

## 2018-02-12 NOTE — Discharge Instructions (Signed)
5 days of prednisone. Use of inhaler as needed for wheezing, shortness of breath. I would recommend taking a daily antihistamine for the next week or so at least, such as daily zyrtec. Tylenol and/or ibuprofen as needed for pain. If symptoms worsen or do not improve in the next week to return to be seen or to follow up with your PCP.

## 2018-02-12 NOTE — ED Provider Notes (Signed)
[facility]    CSN: [id number] Arrival date & time: 02/12/18  1507     History   Chief Complaint Chief Complaint  Patient presents with  . Shortness of Breath  . Headache    HPI Brandy Brock is a 22 y.o. female.   Brandy Brock presents with complaints of left sided chest pain and shortness of breath which started at approximately 1315 while at work today. Pain is worse with deep breaths. She states at onset she felt light headed which led to a dull headache. No longer with lightheaded and now only with mild headache. Breathing has improved. Pain remains constant. Rates the pain 5/10 and is stabbing in sensation. Denies abdominal pain, nausea vomiting or diarrhea. Denies any previous similar. Denies cough or congestion. States she had asthma as a youth which has improved. Did use her inhaler today which helped. Without leg pain or swelling. Is not on oral birth control, no recent travel, does not smoke. Obese. Otherwise without other contributing medical history.   ROS per HPI.      Past Medical History:  Diagnosis Date  . Asthma    childhood    Patient Active Problem List   Diagnosis Date Noted  . Obesity, unspecified 10/13/2008  . ASTHMA, PERSISTENT 10/13/2008    Past Surgical History:  Procedure Laterality Date  . MULTIPLE EXTRACTIONS WITH ALVEOLOPLASTY N/A 10/11/2016   Procedure: MULTIPLE EXTRACTION WITH ALVEOLOPLASTY;  Surgeon: Diona Browner, DDS;  Location: [facility];  Service: Oral Surgery;  Laterality: N/A;    OB History   None      Home Medications    Prior to Admission medications   Medication Sig Start Date End Date Taking? Authorizing Provider  azithromycin (ZITHROMAX) 250 MG tablet Take 1 tablet (250 mg total) by mouth daily. Take first 2 tablets together, then 1 every day until finished. 08/07/17   Vanessa Kick, MD  benzonatate (TESSALON) 100 MG capsule Take 1 capsule (100 mg total) by mouth every 8 (eight) hours. 07/01/17   Tasia Catchings, Amy V, PA-C    cetirizine-pseudoephedrine (ZYRTEC-D) 5-120 MG tablet Take 1 tablet by mouth daily. 07/01/17   Tasia Catchings, Amy V, PA-C  fluticasone (FLONASE) 50 MCG/ACT nasal spray Place 2 sprays into both nostrils daily. 07/01/17   Tasia Catchings, Amy V, PA-C  predniSONE (DELTASONE) 20 MG tablet Take 2 tablets (40 mg total) by mouth daily with breakfast for 5 days. 02/12/18 02/17/18  Zigmund Gottron, NP    Family History History reviewed. No pertinent family history.  Social History Social History   Tobacco Use  . Smoking status: Never Smoker  . Smokeless tobacco: Former Network engineer Use Topics  . Alcohol use: No  . Drug use: No     Allergies   Peanut-containing drug products   Review of Systems Review of Systems   Physical Exam Triage Vital Signs ED Triage Vitals  Enc Vitals Group     BP 02/12/18 1535 (!) 148/99     Pulse Rate 02/12/18 1535 (!) 103     Resp 02/12/18 1535 20     Temp 02/12/18 1535 98.2 F (36.8 C)     Temp src --      SpO2 02/12/18 1535 100 %     Weight --      Height --      Head Circumference --      Peak Flow --      Pain Score 02/12/18 1534 7     Pain Loc --  Pain Edu? --      Excl. in [place]? --    No data found.  Updated Vital Signs BP (!) 148/99   Pulse 88   Temp 98.2 F (36.8 C)   Resp 20   LMP 02/12/2018   SpO2 100%   Visual Acuity Right Eye Distance:   Left Eye Distance:   Bilateral Distance:    Right Eye Near:   Left Eye Near:    Bilateral Near:     Physical Exam  Constitutional: She is oriented to person, place, and time. She appears well-developed and well-nourished. No distress.  HENT:  Head: Normocephalic and atraumatic.  Right Ear: Tympanic membrane, external ear and ear canal normal.  Left Ear: Tympanic membrane, external ear and ear canal normal.  Nose: Nose normal.  Mouth/Throat: Uvula is midline, oropharynx is clear and moist and mucous membranes are normal. No tonsillar exudate.  Eyes: Pupils are equal, round, and reactive to light.  Conjunctivae and EOM are normal.  Cardiovascular: Normal rate, regular rhythm and normal heart sounds.  Pulmonary/Chest: Effort normal and breath sounds normal. She exhibits tenderness.  Left lateral/distal chest wall with tenderness on palpation     Abdominal: Soft.  Neurological: She is alert and oriented to person, place, and time.  Skin: Skin is warm and dry.    ECG NSR rate of 90, without acute changes noted.   UC Treatments / Results  Labs (all labs ordered are listed, but only abnormal results are displayed) Labs Reviewed - No data to display  EKG None Radiology Dg Chest 2 View  Result Date: 02/12/2018 CLINICAL DATA:  Shortness of breath and lightheadedness. EXAM: CHEST - 2 VIEW COMPARISON:  08/07/2017 FINDINGS: The lungs are clear without focal pneumonia, edema, pneumothorax or pleural effusion. The cardiopericardial silhouette is within normal limits for size. The visualized bony structures of the thorax are intact. Telemetry leads overlie the chest. IMPRESSION: No active cardiopulmonary disease. Electronically Signed   By: Misty Stanley M.D.   On: 02/12/2018 16:40    Procedures Procedures (including critical care time)  Medications Ordered in UC Medications  ipratropium-albuterol (DUONEB) 0.5-2.5 (3) MG/3ML nebulizer solution 3 mL (3 mLs Nebulization Given 02/12/18 1615)     Initial Impression / Assessment and Plan / UC Course  I have reviewed the triage vital signs and the nursing notes.  Pertinent labs & imaging results that were available during my care of the patient were reviewed by me and considered in my medical decision making (see chart for details).     Without acute distress noted. Without tachycardia, hypoxia, tachypnea noted during exam. Lungs clear. Without PE risk factors. Symptoms are improving. Chest pain is reproducible on palpation. Patient reports no pain and complete resolution of symptoms after breathing treatment. Chest xray, exam and ekg  reassuring at this time. Prednisone and inhaler for symptoms. Return precautions provided. Patient verbalized understanding and agreeable to plan.    Final Clinical Impressions(s) / UC Diagnoses   Final diagnoses:  Mild intermittent asthma with exacerbation    ED Discharge Orders        Ordered    predniSONE (DELTASONE) 20 MG tablet  Daily with breakfast     02/12/18 1645       Controlled Substance Prescriptions [place] Controlled Substance Registry consulted? Not Applicable   Zigmund Gottron, NP 02/12/18 [phone]

## 2018-02-12 NOTE — ED Triage Notes (Addendum)
Pt here for SOB and light headedness that started about 1:15. She used an inhaler with some relief. Denies cough, congestion. No hx of blood clots, no recent traveling. Chest pain that is constant and worse with breathing.

## 2018-02-13 LAB — POCT PREGNANCY, URINE: PREG TEST UR: NEGATIVE

## 2018-11-16 ENCOUNTER — Ambulatory Visit (HOSPITAL_COMMUNITY)
Admission: EM | Admit: 2018-11-16 | Discharge: 2018-11-16 | Disposition: A | Discharge status: Home / Self Care | Payer: Medicaid Other

## 2018-11-16 NOTE — ED Triage Notes (Signed)
Pt did not answer x 3 

## 2019-04-13 ENCOUNTER — Other Ambulatory Visit: Payer: Self-pay

## 2019-04-13 ENCOUNTER — Inpatient Hospital Stay (HOSPITAL_COMMUNITY)
Admission: AD | Admit: 2019-04-13 | Discharge: 2019-04-13 | Disposition: A | Discharge status: Home / Self Care | Payer: Medicaid Other | Attending: Obstetrics and Gynecology | Admitting: Obstetrics and Gynecology

## 2019-04-13 DIAGNOSIS — N939 Abnormal uterine and vaginal bleeding, unspecified: Secondary | ICD-10-CM | POA: Insufficient documentation

## 2019-04-13 LAB — POCT PREGNANCY, URINE: Preg Test, Ur: NEGATIVE

## 2019-04-13 NOTE — MAU Note (Signed)
MAU provider spoke with patient about test results. Provider gave patient options for further evaluation. Pt left in stable condition.

## 2019-04-13 NOTE — MAU Provider Note (Addendum)
Ms. Brandy Brock is a 23 y.o. female who presents to MAU today for vaginal bleeding that is earlier than she had estimated. Her LMP was 03/15/2019. According to her menses app, her menses "should start this week, but unsure of the date." She denies abdominal pain or vaginal bleeding.   BP (!) 157/86 (BP Location: Right Arm)   Pulse 88   Temp 98.7 F (37.1 C) (Oral)   Resp 16   Ht 5\' 6"  (1.676 m)   Wt (!) 178 kg   LMP 04/06/2019   SpO2 100%   BMI 63.35 kg/m  CONSTITUTIONAL: Well-developed, well-nourished female in no acute distress.  CARDIOVASCULAR: Normal heart rate noted RESPIRATORY: Effort and breath sounds normal GASTROINTESTINAL:Soft, no distention noted.  No tenderness, rebound or guarding.  SKIN: Skin is warm and dry. No rash noted. Not diaphoretic. No erythema. No pallor. PSYCHIATRIC: Normal mood and affect. Normal behavior. Normal judgment and thought content.  MDM Medical screening exam complete Patient does not endorse any symptoms concerning for ectopic pregnancy or pregnancy related complication today.   A:  Vaginal Bleeding  P: Discharge home Patient advised that she can present as a walk-in to [facility] for a pregnancy test M-Th between 8am-4pm or Friday between [place] Patient may return to MAU for pregnancy related emergncies  Laury Deep, CNM 04/13/2019 10:23 AM

## 2019-04-13 NOTE — Discharge Instructions (Signed)
On January 03, 2019, the [facility] will be moving to the [employer] At that time, the MAU (Maternity Admissions Unit), where you are being seen today, will no longer take care of non-pregnant patients. We strongly encourage you to find a doctor's office before that time, so that you can be seen with any GYN concerns, like vaginal discharge, urinary tract infection, etc.. in a timely manner.  In order to make an office visit more convenient, the [facility] [place] at [facility] will be offering evening hours with same-day appointments, walk-in appointments and scheduled appointments available during this time.  Center for [facility] @ [facility] Hours: Monday - 8am - 7:30 pm with walk-in between 4pm- 7:30 pm Tuesday - 8 am - 5 pm (starting 02/10/18 we will be open late and accepting walk-ins from 4pm - 7:30pm) Wednesday - 8 am - 5 pm (starting 05/13/18 we will be open late and accepting walk-ins from 4pm - 7:30pm) Thursday 8 am - 5 pm (starting 08/13/18 we will be open late and accepting walk-ins from 4pm - 7:30pm) Friday 8 am - 5 pm  For an appointment please call the Center for [place] @ [facility] at [phone]  For urgent needs, [facility] is also available for management of urgent GYN complaints such as vaginal discharge or urinary tract infections.

## 2019-04-13 NOTE — MAU Note (Signed)
Pt is having vaginal bleeding earlier than expected for her period. Had period on 03/15/19 and then started bleeding again 5/26 until now. Has not taken a pregnancy test. Having cramping 7/10 at it's worst.

## 2019-04-20 ENCOUNTER — Emergency Department (HOSPITAL_COMMUNITY)
Admission: EM | Admit: 2019-04-20 | Discharge: 2019-04-20 | Disposition: A | Discharge status: Home / Self Care | Payer: Self-pay | Attending: Emergency Medicine | Admitting: Emergency Medicine

## 2019-04-20 ENCOUNTER — Other Ambulatory Visit: Payer: Self-pay

## 2019-04-20 ENCOUNTER — Encounter (HOSPITAL_COMMUNITY): Payer: Self-pay

## 2019-04-20 ENCOUNTER — Emergency Department (HOSPITAL_COMMUNITY): Payer: Self-pay

## 2019-04-20 ENCOUNTER — Ambulatory Visit (HOSPITAL_COMMUNITY)
Admission: EM | Admit: 2019-04-20 | Discharge: 2019-04-20 | Disposition: A | Discharge status: Home / Self Care | Payer: Self-pay | Attending: Family Medicine | Admitting: Family Medicine

## 2019-04-20 DIAGNOSIS — N390 Urinary tract infection, site not specified: Secondary | ICD-10-CM | POA: Insufficient documentation

## 2019-04-20 DIAGNOSIS — R1031 Right lower quadrant pain: Secondary | ICD-10-CM

## 2019-04-20 DIAGNOSIS — R300 Dysuria: Secondary | ICD-10-CM | POA: Insufficient documentation

## 2019-04-20 DIAGNOSIS — Z3202 Encounter for pregnancy test, result negative: Secondary | ICD-10-CM

## 2019-04-20 DIAGNOSIS — R110 Nausea: Secondary | ICD-10-CM | POA: Insufficient documentation

## 2019-04-20 LAB — COMPREHENSIVE METABOLIC PANEL
ALT: 16 U/L (ref 0–44)
AST: 27 U/L (ref 15–41)
Albumin: 3.5 g/dL (ref 3.5–5.0)
Alkaline Phosphatase: 59 U/L (ref 38–126)
Anion gap: 9 (ref 5–15)
BUN: 6 mg/dL (ref 6–20)
CO2: 24 mmol/L (ref 22–32)
Calcium: 9.2 mg/dL (ref 8.9–10.3)
Chloride: 104 mmol/L (ref 98–111)
Creatinine, Ser: 0.66 mg/dL (ref 0.44–1.00)
GFR calc Af Amer: >60 mL/min (ref >60)
GFR calc non Af Amer: >60 mL/min (ref >60)
Glucose, Bld: 85 mg/dL (ref 70–99)
Potassium: 4.7 mmol/L (ref 3.5–5.1)
Sodium: 137 mmol/L (ref 135–145)
Total Bilirubin: 1.1 mg/dL (ref 0.3–1.2)
Total Protein: 8.1 g/dL (ref 6.5–8.1)

## 2019-04-20 LAB — CBC WITH DIFFERENTIAL/PLATELET
Abs Immature Granulocytes: 0.03 10*3/uL (ref 0.00–0.07)
Basophils Absolute: 0 10*3/uL (ref 0.0–0.1)
Basophils Relative: 0 %
Eosinophils Absolute: 0.1 10*3/uL (ref 0.0–0.5)
Eosinophils Relative: 1 %
HCT: 37.4 % (ref 36.0–46.0)
Hemoglobin: 12.2 g/dL (ref 12.0–15.0)
Immature Granulocytes: 0 %
Lymphocytes Relative: 24 %
Lymphs Abs: 2.7 10*3/uL (ref 0.7–4.0)
MCH: 28.6 pg (ref 26.0–34.0)
MCHC: 32.6 g/dL (ref 30.0–36.0)
MCV: 87.8 fL (ref 80.0–100.0)
Monocytes Absolute: 0.7 10*3/uL (ref 0.1–1.0)
Monocytes Relative: 7 %
Neutro Abs: 7.7 10*3/uL (ref 1.7–7.7)
Neutrophils Relative %: 68 %
Platelets: 324 10*3/uL (ref 150–400)
RBC: 4.26 MIL/uL (ref 3.87–5.11)
RDW: 12.6 % (ref 11.5–15.5)
WBC: 11.3 10*3/uL — ABNORMAL HIGH (ref 4.0–10.5)
nRBC: 0 % (ref 0.0–0.2)

## 2019-04-20 LAB — POCT URINALYSIS DIP (DEVICE)
Bilirubin Urine: NEGATIVE
Glucose, UA: NEGATIVE mg/dL
Hgb urine dipstick: NEGATIVE
Ketones, ur: NEGATIVE mg/dL
Nitrite: NEGATIVE
Protein, ur: 30 mg/dL — AB
Specific Gravity, Urine: 1.025 (ref 1.005–1.030)
Urobilinogen, UA: 1 mg/dL (ref 0.0–1.0)
pH: 7.5 (ref 5.0–8.0)

## 2019-04-20 LAB — URINALYSIS, ROUTINE W REFLEX MICROSCOPIC
Bilirubin Urine: NEGATIVE
Glucose, UA: NEGATIVE mg/dL
Ketones, ur: NEGATIVE mg/dL
Nitrite: NEGATIVE
Protein, ur: 100 mg/dL — AB
Specific Gravity, Urine: 1.027 (ref 1.005–1.030)
WBC, UA: >50 WBC/hpf — ABNORMAL HIGH (ref 0–5)
pH: 7 (ref 5.0–8.0)

## 2019-04-20 LAB — POCT PREGNANCY, URINE: Preg Test, Ur: NEGATIVE

## 2019-04-20 MED ORDER — CEPHALEXIN 500 MG PO CAPS: 500.0000 mg | ORAL_CAPSULE | Freq: Four times a day (QID) | ORAL | 0 refills | Status: DC

## 2019-04-20 MED ORDER — IOPAMIDOL (ISOVUE-300) INJECTION 61%
100.0000 mL | Freq: Once | INTRAVENOUS | Status: AC | PRN
  Administered 2019-04-20: 100 mL via INTRAVENOUS

## 2019-04-20 NOTE — ED Provider Notes (Signed)
[facility]    CSN: [id number] Arrival date & time: 04/20/19  9326     History   Chief Complaint Chief Complaint  Patient presents with  . Abdominal Pain  . Vomiting    HPI Brandy Brock is a 23 y.o. female.   Patient is a 23 year old female the presents today with approximately 1 week of worsening abdominal discomfort.  The abdominal pain started in the umbilicus area and has moved to the right lower quadrant.  The pain is worse with laying flat and walking.  Yesterday she started vomiting.  Denies any associated dysuria, hematuria or urinary frequency.  Denies any vaginal discharge or bleeding.  Reported that she is currently sexually active with one partner but is not worried about STDs. Patient's last menstrual period was 04/06/2019.  Denies any history of ovarian cysts or gynecological problems.  She has been having regular bowel movements.  Denies any blood in stool.  No fevers.  ROS per HPI       Past Medical History:  Diagnosis Date  . Asthma    childhood    Patient Active Problem List   Diagnosis Date Noted  . Vagina bleeding 04/13/2019  . Obesity, unspecified 10/13/2008  . ASTHMA, PERSISTENT 10/13/2008    Past Surgical History:  Procedure Laterality Date  . MULTIPLE EXTRACTIONS WITH ALVEOLOPLASTY N/A 10/11/2016   Procedure: MULTIPLE EXTRACTION WITH ALVEOLOPLASTY;  Surgeon: Diona Browner, DDS;  Location: [place];  Service: Oral Surgery;  Laterality: N/A;    OB History   No obstetric history on file.      Home Medications    Prior to Admission medications   Not on File    Family History Family History  Family history unknown: Yes    Social History Social History   Tobacco Use  . Smoking status: Never Smoker  . Smokeless tobacco: Former Network engineer Use Topics  . Alcohol use: No  . Drug use: No     Allergies   Peanut-containing drug products   Review of Systems Review of Systems   Physical Exam Triage Vital Signs  ED Triage Vitals  Enc Vitals Group     BP 04/20/19 0847 (!) 141/95     Pulse Rate 04/20/19 0844 76     Resp 04/20/19 0844 17     Temp 04/20/19 0844 97.8 F (36.6 C)     Temp Source 04/20/19 0844 Oral     SpO2 04/20/19 0844 95 %     Weight --      Height --      Head Circumference --      Peak Flow --      Pain Score 04/20/19 0842 7     Pain Loc --      Pain Edu? --      Excl. in [place]? --    No data found.  Updated Vital Signs BP (!) 141/95 (BP Location: Left Arm)   Pulse 76   Temp 97.8 F (36.6 C) (Oral)   Resp 17   LMP 04/06/2019   SpO2 95%   Visual Acuity Right Eye Distance:   Left Eye Distance:   Bilateral Distance:    Right Eye Near:   Left Eye Near:    Bilateral Near:     Physical Exam Vitals signs and nursing note reviewed.  Constitutional:      General: She is not in acute distress.    Appearance: She is obese. She is not ill-appearing, toxic-appearing or diaphoretic.  HENT:     Head: Normocephalic.  Pulmonary:     Effort: Pulmonary effort is normal.  Abdominal:     General: Abdomen is protuberant.     Palpations: Abdomen is soft.     Tenderness: There is abdominal tenderness in the right lower quadrant.     Hernia: No hernia is present.     Comments: Abdominal assessment limited by body habitus.  Skin:    General: Skin is warm and dry.  Psychiatric:        Mood and Affect: Mood normal.      UC Treatments / Results  Labs (all labs ordered are listed, but only abnormal results are displayed) Labs Reviewed  POCT URINALYSIS DIP (DEVICE) - Abnormal; Notable for the following components:      Result Value   Protein, ur 30 (*)    Leukocytes,Ua SMALL (*)    All other components within normal limits  POC URINE PREG, ED  POCT PREGNANCY, URINE    EKG None  Radiology No results found.  Procedures Procedures (including critical care time)  Medications Ordered in UC Medications - No data to display  Initial Impression / Assessment and  Plan / UC Course  I have reviewed the triage vital signs and the nursing notes.  Pertinent labs & imaging results that were available during my care of the patient were reviewed by me and considered in my medical decision making (see chart for details).     Urine negative for urinary tract infection or pregnancy Based on exam, symptoms we will go ahead and send to the ER for further evaluation of the right lower quadrant pain with CT scan to rule out appendicitis. Pt mom taking her  Final Clinical Impressions(s) / UC Diagnoses   Final diagnoses:  Right lower quadrant abdominal pain     Discharge Instructions     Go to the ER for further evaluation of your right lower quadrant pain.    ED Prescriptions    None     Controlled Substance Prescriptions [place] Controlled Substance Registry consulted? Not Applicable   Orvan July, NP 04/20/19 [phone]

## 2019-04-20 NOTE — ED Triage Notes (Signed)
Pt presents with right lower quadrant abdominal pain for over a week and vomiting since yesterday morning from unknown source.

## 2019-04-20 NOTE — Discharge Instructions (Signed)
Return if any problems.  See your Physician for recheck  °

## 2019-04-20 NOTE — Discharge Instructions (Signed)
Go to the ER for further evaluation of your right lower quadrant pain.

## 2019-04-20 NOTE — ED Notes (Signed)
Patient verbalizes understanding of discharge instructions. Opportunity for questioning and answering were provided.  patient discharged from ED.  

## 2019-04-20 NOTE — ED Triage Notes (Signed)
Pt endores right sided abd pain x 1 week with n/v last night. Denies cough or exposure to covid. VSS. Denies urinary sx.

## 2019-04-21 NOTE — ED Provider Notes (Signed)
[facility] EMERGENCY DEPARTMENT Provider Note   CSN: [id number] Arrival date & time: 04/20/19  0908    History   Chief Complaint Chief Complaint  Patient presents with  . Abdominal Pain    HPI Brandy Brock is a 23 y.o. female.     The history is provided by the patient. No language interpreter was used.  Abdominal Pain  Pain location:  Generalized Pain quality: aching   Pain radiates to:  Does not radiate Pain severity:  Moderate Onset quality:  Gradual Duration:  2 days Timing:  Constant Progression:  Worsening Chronicity:  New Relieved by:  Nothing Worsened by:  Nothing Ineffective treatments:  None tried Associated symptoms: dysuria and nausea   Associated symptoms: no fever   Risk factors: no alcohol abuse and not pregnant     Past Medical History:  Diagnosis Date  . Asthma    childhood    Patient Active Problem List   Diagnosis Date Noted  . Vagina bleeding 04/13/2019  . Obesity, unspecified 10/13/2008  . ASTHMA, PERSISTENT 10/13/2008    Past Surgical History:  Procedure Laterality Date  . MULTIPLE EXTRACTIONS WITH ALVEOLOPLASTY N/A 10/11/2016   Procedure: MULTIPLE EXTRACTION WITH ALVEOLOPLASTY;  Surgeon: Diona Browner, DDS;  Location: [facility];  Service: Oral Surgery;  Laterality: N/A;     OB History   No obstetric history on file.      Home Medications    Prior to Admission medications   Medication Sig Start Date End Date Taking? Authorizing Provider  cephALEXin (KEFLEX) 500 MG capsule Take 1 capsule (500 mg total) by mouth 4 (four) times daily. 04/20/19   Fransico Meadow, PA-C    Family History Family History  Family history unknown: Yes    Social History Social History   Tobacco Use  . Smoking status: Never Smoker  . Smokeless tobacco: Former Network engineer Use Topics  . Alcohol use: No  . Drug use: No     Allergies   Peanut-containing drug products   Review of Systems Review of Systems   Constitutional: Negative for fever.  Gastrointestinal: Positive for abdominal pain and nausea.  Genitourinary: Positive for dysuria.  All other systems reviewed and are negative.    Physical Exam Updated Vital Signs BP 107/73 (BP Location: Right Arm)   Pulse 76   Temp 98.5 F (36.9 C) (Oral)   Resp 18   LMP 04/06/2019 (Exact Date)   SpO2 96%   Physical Exam Vitals signs and nursing note reviewed.  Constitutional:      Appearance: She is well-developed.  HENT:     Head: Normocephalic.  Neck:     Musculoskeletal: Normal range of motion.  Cardiovascular:     Rate and Rhythm: Regular rhythm. Tachycardia present.  Pulmonary:     Effort: Pulmonary effort is normal.  Abdominal:     General: Bowel sounds are normal. There is no distension.     Palpations: Abdomen is soft.     Tenderness: There is no abdominal tenderness.  Genitourinary:    Adnexa: Right adnexa normal.  Musculoskeletal: Normal range of motion.  Skin:    General: Skin is warm.  Neurological:     Mental Status: She is alert and oriented to person, place, and time.  Psychiatric:        Mood and Affect: Mood normal.      ED Treatments / Results  Labs (all labs ordered are listed, but only abnormal results are displayed) Labs Reviewed  CBC WITH DIFFERENTIAL/PLATELET - Abnormal; Notable for the following components:      Result Value   WBC 11.3 (*)    All other components within normal limits  URINALYSIS, ROUTINE W REFLEX MICROSCOPIC - Abnormal; Notable for the following components:   Color, Urine AMBER (*)    APPearance CLOUDY (*)    Hgb urine dipstick SMALL (*)    Protein, ur 100 (*)    Leukocytes,Ua LARGE (*)    WBC, UA >50 (*)    Bacteria, UA MANY (*)    All other components within normal limits  COMPREHENSIVE METABOLIC PANEL    EKG None  Radiology Ct Abdomen Pelvis W Contrast  Result Date: 04/20/2019 CLINICAL DATA:  Right lower quadrant pain for the past 3 days. EXAM: CT ABDOMEN AND  PELVIS WITH CONTRAST TECHNIQUE: Multidetector CT imaging of the abdomen and pelvis was performed using the standard protocol following bolus administration of intravenous contrast. CONTRAST:  116mL ISOVUE-300 IOPAMIDOL (ISOVUE-300) INJECTION 61% COMPARISON:  None. FINDINGS: Lower chest: No acute abnormality. Hepatobiliary: No focal liver abnormality is seen. No gallstones, gallbladder wall thickening, or biliary dilatation. Pancreas: Unremarkable. No pancreatic ductal dilatation or surrounding inflammatory changes. Spleen: Normal in size without focal abnormality. Adrenals/Urinary Tract: Adrenal glands are unremarkable. Kidneys are normal, without renal calculi, focal lesion, or hydronephrosis. Bladder is unremarkable for the degree of distention. Stomach/Bowel: Stomach is within normal limits. Appendix appears normal (series 6, images 73 and 74). No evidence of bowel wall thickening, distention, or inflammatory changes. Vascular/Lymphatic: No significant vascular findings are present. No enlarged abdominal or pelvic lymph nodes. Reproductive: Uterus and bilateral adnexa are unremarkable. Other: No abdominal wall hernia or abnormality. No abdominopelvic ascites. No pneumoperitoneum. Musculoskeletal: No acute or significant osseous findings. IMPRESSION: 1.  No acute intra-abdominal process.  Normal appendix. Electronically Signed   By: Titus Dubin M.D.   On: 04/20/2019 12:44    Procedures Procedures (including critical care time)  Medications Ordered in ED Medications  iopamidol (ISOVUE-300) 61 % injection 100 mL (100 mLs Intravenous Contrast Given 04/20/19 1236)  An After Visit Summary was printed and given to the patient.    Initial Impression / Assessment and Plan / ED Course  I have reviewed the triage vital signs and the nursing notes.  Pertinent labs & imaging results that were available during my care of the patient were reviewed by me and considered in my medical decision making (see chart  for details).        MDM   Ct scan is normal  Ua shows greater than 50 wbc's.   Pt counseled on uti's.   Pt given rx for keflex   Final Clinical Impressions(s) / ED Diagnoses   Final diagnoses:  Urinary tract infection without hematuria, site unspecified    ED Discharge Orders         Ordered    cephALEXin (KEFLEX) 500 MG capsule  4 times daily     04/20/19 1325           Sidney Ace 04/21/19 0915    Jola Schmidt, MD 04/23/19 1338

## 2019-08-23 ENCOUNTER — Emergency Department (HOSPITAL_COMMUNITY)
Admission: EM | Admit: 2019-08-23 | Discharge: 2019-08-23 | Disposition: A | Discharge status: Home / Self Care | Payer: 59 | Attending: Emergency Medicine | Admitting: Emergency Medicine

## 2019-08-23 ENCOUNTER — Other Ambulatory Visit: Payer: Self-pay

## 2019-08-23 ENCOUNTER — Encounter (HOSPITAL_COMMUNITY): Payer: Self-pay | Admitting: Emergency Medicine

## 2019-08-23 DIAGNOSIS — J069 Acute upper respiratory infection, unspecified: Secondary | ICD-10-CM | POA: Diagnosis not present

## 2019-08-23 DIAGNOSIS — J029 Acute pharyngitis, unspecified: Secondary | ICD-10-CM | POA: Diagnosis present

## 2019-08-23 DIAGNOSIS — Z20828 Contact with and (suspected) exposure to other viral communicable diseases: Secondary | ICD-10-CM | POA: Diagnosis not present

## 2019-08-23 MED ORDER — BENZONATATE 100 MG PO CAPS
100.0000 mg | ORAL_CAPSULE | Freq: Three times a day (TID) | ORAL | 0 refills | Status: DC
Start: 2019-08-23 — End: 2020-11-13

## 2019-08-23 NOTE — ED Triage Notes (Signed)
Pt reports sore throat and cough since yesterday.  °

## 2019-08-23 NOTE — ED Provider Notes (Signed)
[facility] EMERGENCY DEPARTMENT Provider Note   CSN: [id number] Arrival date & time: 08/23/19  0946     History   Chief Complaint Chief Complaint  Patient presents with  . Sore Throat  . URI    HPI Brandy Brock is a 23 y.o. female.     HPI    23 year old female presents today with complaints of sore throat and cough.  Patient had symptoms started yesterday with sore throat and productive cough.  She notes intermittent shortness of breath, none presently.  She denies any fever or neck pain, no difficulty swallowing.  She reports a remote history of asthma but notes this is not severe.  She denies any close COVID-19 exposure or sick contacts.  She notes she works from home.  No medications prior to arrival.     Past Medical History:  Diagnosis Date  . Asthma    childhood    Patient Active Problem List   Diagnosis Date Noted  . Vagina bleeding 04/13/2019  . Obesity, unspecified 10/13/2008  . ASTHMA, PERSISTENT 10/13/2008    Past Surgical History:  Procedure Laterality Date  . MULTIPLE EXTRACTIONS WITH ALVEOLOPLASTY N/A 10/11/2016   Procedure: MULTIPLE EXTRACTION WITH ALVEOLOPLASTY;  Surgeon: Diona Browner, DDS;  Location: [place];  Service: Oral Surgery;  Laterality: N/A;     OB History   No obstetric history on file.      Home Medications    Prior to Admission medications   Medication Sig Start Date End Date Taking? Authorizing Provider  benzonatate (TESSALON) 100 MG capsule Take 1 capsule (100 mg total) by mouth every 8 (eight) hours. 08/23/19   Nilan Iddings, Dellis Filbert, PA-C  cephALEXin (KEFLEX) 500 MG capsule Take 1 capsule (500 mg total) by mouth 4 (four) times daily. 04/20/19   Fransico Meadow, PA-C    Family History Family History  Family history unknown: Yes    Social History Social History   Tobacco Use  . Smoking status: Never Smoker  . Smokeless tobacco: Former Network engineer Use Topics  . Alcohol use: No  . Drug use: No      Allergies   Peanut-containing drug products   Review of Systems Review of Systems  All other systems reviewed and are negative.    Physical Exam Updated Vital Signs BP (!) 143/92 (BP Location: Right Arm)   Pulse 83   Temp 98.4 F (36.9 C) (Oral)   Resp 15   Ht 5\' 7"  (1.702 m)   Wt (!) 172.4 kg   LMP 08/16/2019 (Exact Date)   SpO2 99%   BMI 59.52 kg/m   Physical Exam Vitals signs and nursing note reviewed.  Constitutional:      Appearance: She is well-developed.  HENT:     Head: Normocephalic and atraumatic.     Comments: Oropharynx is clear no swelling edema erythema or exudate voice is normal no pooling of secretions uvula is midline rises with phonation-no cervical lymphadenopathy Eyes:     General: No scleral icterus.       Right eye: No discharge.        Left eye: No discharge.     Conjunctiva/sclera: Conjunctivae normal.     Pupils: Pupils are equal, round, and reactive to light.  Neck:     Musculoskeletal: Normal range of motion.     Vascular: No JVD.     Trachea: No tracheal deviation.  Pulmonary:     Effort: Pulmonary effort is normal. No respiratory distress.  Breath sounds: Normal breath sounds. No stridor. No wheezing, rhonchi or rales.  Neurological:     Mental Status: She is alert and oriented to person, place, and time.     Coordination: Coordination normal.  Psychiatric:        Behavior: Behavior normal.        Thought Content: Thought content normal.        Judgment: Judgment normal.      ED Treatments / Results  Labs (all labs ordered are listed, but only abnormal results are displayed) Labs Reviewed  NOVEL CORONAVIRUS, NAA (HOSP ORDER, SEND-OUT TO REF LAB; TAT 18-24 HRS)    EKG None  Radiology No results found.  Procedures Procedures (including critical care time)  Medications Ordered in ED Medications - No data to display   Initial Impression / Assessment and Plan / ED Course  I have reviewed the triage vital signs  and the nursing notes.  Pertinent labs & imaging results that were available during my care of the patient were reviewed by me and considered in my medical decision making (see chart for details).        23 year old female presents today with likely viral respiratory infection.  She has no signs of strep pharyngitis on my exam very low suspicion for this.  Patient has no known covert exposure, but given current COVID pandemic will screen for covert at this time.  Patient discharged symptomatic care and strict return precautions.  Verbalized understanding and agreement to today's plan had no further questions or concerns at time of discharge.  Patient works from home.  Brandy Brock was evaluated in Emergency Department on 08/23/2019 for the symptoms described in the history of present illness. She was evaluated in the context of the global COVID-19 pandemic, which necessitated consideration that the patient might be at risk for infection with the SARS-CoV-2 virus that causes COVID-19. Institutional protocols and algorithms that pertain to the evaluation of patients at risk for COVID-19 are in a state of rapid change based on information released by regulatory bodies including the CDC and federal and state organizations. These policies and algorithms were followed during the patient's care in the ED.   Final Clinical Impressions(s) / ED Diagnoses   Final diagnoses:  Viral URI with cough    ED Discharge Orders         Ordered    benzonatate (TESSALON) 100 MG capsule  Every 8 hours     08/23/19 1103           Okey Regal, PA-C 08/23/19 1104    Pattricia Boss, MD 08/23/19 [phone]

## 2019-08-23 NOTE — ED Notes (Signed)
Patient verbalizes understanding of discharge instructions. Opportunity for questioning and answers were provided. Armband removed by staff, pt discharged from ED ambulatory to home.  

## 2019-08-23 NOTE — Discharge Instructions (Signed)
Please read attached information. If you experience any new or worsening signs or symptoms please return to the emergency room for evaluation. Please follow-up with your primary care provider or specialist as discussed. Please use medication prescribed only as directed and discontinue taking if you have any concerning signs or symptoms.   °

## 2019-08-24 LAB — NOVEL CORONAVIRUS, NAA (HOSP ORDER, SEND-OUT TO REF LAB; TAT 18-24 HRS): SARS-CoV-2, NAA: NOT DETECTED

## 2020-01-10 LAB — CYTOLOGY - PAP

## 2020-09-25 LAB — OB RESULTS CONSOLE ABO/RH: RH Type: POSITIVE

## 2020-09-25 LAB — OB RESULTS CONSOLE RUBELLA ANTIBODY, IGM: Rubella: IMMUNE

## 2020-09-25 LAB — HEMOGLOBIN EVAL RFX ELECTROPHORESIS
Glucose 1 Hour: 87
Hemoglobin Evaluation: NORMAL
Urine Culture, OB: NEGATIVE

## 2020-09-25 LAB — OB RESULTS CONSOLE PLATELET COUNT: Platelets: 235

## 2020-09-25 LAB — OB RESULTS CONSOLE RPR
RPR: REACTIVE
RPR: REACTIVE

## 2020-09-25 LAB — OB RESULTS CONSOLE VARICELLA ZOSTER ANTIBODY, IGG: Varicella: NON-IMMUNE/NOT IMMUNE

## 2020-09-25 LAB — OB RESULTS CONSOLE HEPATITIS B SURFACE ANTIGEN: Hepatitis B Surface Ag: NEGATIVE

## 2020-09-25 LAB — OB RESULTS CONSOLE HGB/HCT, BLOOD
HCT: 37 (ref 29–41)
Hemoglobin: 12

## 2020-09-25 LAB — HEPATITIS C ANTIBODY: HCV Ab: NEGATIVE

## 2020-09-25 LAB — OB RESULTS CONSOLE GC/CHLAMYDIA
Chlamydia: NEGATIVE
Gonorrhea: NEGATIVE

## 2020-09-25 LAB — OB RESULTS CONSOLE HIV ANTIBODY (ROUTINE TESTING): HIV: NONREACTIVE

## 2020-09-25 LAB — OB RESULTS CONSOLE ANTIBODY SCREEN: Antibody Screen: NEGATIVE

## 2020-10-26 ENCOUNTER — Telehealth (INDEPENDENT_AMBULATORY_CARE_PROVIDER_SITE_OTHER): Payer: Medicaid Other | Admitting: *Deleted

## 2020-10-26 ENCOUNTER — Encounter: Payer: Self-pay | Admitting: *Deleted

## 2020-10-26 ENCOUNTER — Other Ambulatory Visit: Payer: Self-pay

## 2020-10-26 DIAGNOSIS — O98119 Syphilis complicating pregnancy, unspecified trimester: Secondary | ICD-10-CM

## 2020-10-26 DIAGNOSIS — R87612 Low grade squamous intraepithelial lesion on cytologic smear of cervix (LGSIL): Secondary | ICD-10-CM

## 2020-10-26 DIAGNOSIS — Z3A00 Weeks of gestation of pregnancy not specified: Secondary | ICD-10-CM

## 2020-10-26 DIAGNOSIS — I10 Essential (primary) hypertension: Secondary | ICD-10-CM

## 2020-10-26 DIAGNOSIS — O0990 Supervision of high risk pregnancy, unspecified, unspecified trimester: Secondary | ICD-10-CM

## 2020-10-26 DIAGNOSIS — O10919 Unspecified pre-existing hypertension complicating pregnancy, unspecified trimester: Secondary | ICD-10-CM | POA: Insufficient documentation

## 2020-10-26 DIAGNOSIS — O99210 Obesity complicating pregnancy, unspecified trimester: Secondary | ICD-10-CM

## 2020-10-26 MED ORDER — BLOOD PRESSURE KIT DEVI: 1.0000 | 0 refills | Status: DC | PRN

## 2020-10-26 NOTE — Progress Notes (Addendum)
10:20 Brandy Brock not connected virtually for her virtual appointment. I called her and confirmed her identity with 2 identifiers. I asked her if she is ready for her virtual visit and gave her instructions on how to join; link sent. Fredda Clarida,RN  New OB Intake   [place] unable to connect virtually. Visit completed by phone.   I connected with  Doretha Goding Rossin on 10/26/20 at 10:15 AM EST by telephone and verified that I am speaking with the correct person using two identifiers. Nurse is located at [facility] and pt is located at school in her car alone.  I discussed the limitations, risks, security and privacy concerns of performing an evaluation and management service by telephone and the availability of in person appointments. I also discussed with the patient that there may be a patient responsible charge related to this service. The patient expressed understanding and agreed to proceed.  I explained I am completing New OB Intake today. We discussed her EDD of 03/10/21 that is based on LMP of 06/03/20. She started her care at [facility] and states she elected to transfer to [facility] because she would rather come to [facility]. She states she had misunderstood and told them her first day LMP was 06/07/20 , but states that was the last day and first day was 06/03/20 . She states they did an ultrasound and her EDD was different. I explained we do not yet have that [place] and I will call the health deparment since she verified she signed a release for them to send records. I explained after provider reviews the ultrasound and her chart they will decide on best EDD. She can discuss with provider at her new ob appointment.  Pt is G1/P0. I reviewed her allergies, medications, Medical/Surgical/OB history, and appropriate screenings. I informed her of [facility] services. Based on history, this is a/an complicated by Morbid Obesity, Syphilis in pregnancy pregnancy.  Concerns addressed today  Delivery plans Plans to  deliver at [facility].    MyChart/Babyscripts Sent MyChart text with instructions how to sign up and download app.  I explained pt will have some visits in office and some virtually. Babyscripts instructions given.  Blood Pressure Cuff Blood pressure cuff ordered for patient to pick-up from [employer]. Explained after first prenatal appt pt will check weekly and document in 78.  Anatomy US Explained provider will review ultrasound from Health department and order futher as medically necessary.  Labs Explained we would do additional labs as indicated since the health department has already drawn initial labs.   [place] Patient states she already has [place].   Covid Vaccine Patient has not had covid vaccine.  New Patient meeting Informed patient of new patient Zoom meeting and information placed in AVS.   First visit review I reviewed new OB appt with pt. I explained she will have a routine prenatal  Exam.  Explained pt will be seen by Tammy Sours  at first visit; encounter routed to appropriate provider.  Reilynn Lauro,RN 10/26/2020  10:30 AM    Chart reviewed for nurse visit. Agree with plan of care.   Virginia Rochester, NP 10/27/2020 7:46 AM

## 2020-10-26 NOTE — Patient Instructions (Signed)
- At our [facility], we work as an integrated team, providing care to address both physical and emotional health. Your medical provider may refer you to see our [facility]) on the same day you see your medical provider, as availability permits; often scheduled virtually at your convenience.  Our [facility] is available to all patients, visits generally last between 20-30 minutes, but can be longer or shorter, depending on patient need. The [facility] offers help with stress management, coping with symptoms of depression and anxiety, major life changes , sleep issues, changing risky behavior, grief and loss, life stress, working on personal life goals, and  behavioral health issues, as these all affect your overall health and wellness.  The [facility] is NOT available for the following: FMLA paperwork, court-ordered evaluations, specialty assessments (custody or disability), letters to employers, or obtaining certification for an emotional support animal. The [facility] does not provide long-term therapy. You have the right to refuse integrated behavioral health services, or to reschedule to see the [facility] at a later date.  Confidentiality exception: If it is suspected that a child or disabled adult is being abused or neglected, we are required by law to report that to either Child Protective Services or Adult Scientist, forensic.  If you have a diagnosis of Bipolar affective disorder, Schizophrenia, or recurrent Major depressive disorder, we will recommend that you establish care with a psychiatrist, as these are lifelong, chronic conditions, and we want your overall emotional health and medications to be more closely monitored. If you anticipate needing extended maternity leave due to mental health issues postpartum, it it recommended you inform your medical provider, so we can put in a referral to a  psychiatrist as soon as possible. The [facility] is unable to recommend an extended maternity leave for mental  health issues. Your medical provider or [facility] may refer you to a therapist for ongoing, traditional therapy, or to a psychiatrist, for medication management, if it would benefit your overall health. Depending on your insurance, you may have a copay to see the [facility]. If you are uninsured, it is recommended that you apply for financial assistance. (Forms may be requested at the front desk for in-person visits, via MyChart, or request a form during a virtual visit).  If you see the [facility] more than 6 times, you will have to complete a comprehensive clinical assessment interview with the [facility] to resume integrated services.  For virtual visits with the [facility], you must be physically in the state of [place] at the time of the visit. For example, if you live in [place], you will have to do an in-person visit with the [facility] and your out-of-state insurance may not cover behavioral health services in [place]. f you are going out of the state or country for any reason, the [facility] may see you virtually when you return to [place], but not while you are physically outside of [place].     Meet the Provider [place] for [place] is now offering FREE monthly 1-hour virtual Zoom sessions for new, current, and prospective patients.        During these sessions, you can:   Learn about our practice, model of care, services   Get answers to questions about pregnancy and birth during [place] your provider's brain about anything else!    Sessions will be hosted by [employer] for [employer], Engineer, materials, Physicians and Midwives  No registration required      2021 Dates:      All at 6pm     October 21st     November 18th   December 16th     January 20th  February 17th    To join one of these meetings, a few minutes before it is set to start:     Copy/paste the link into your web  browser:  [email or web address]    OR  Scan the QR code below (open up your camera and point towards QR code; click on tab that pops up on your phone ("zoom")

## 2020-11-01 ENCOUNTER — Telehealth: Payer: Self-pay | Admitting: *Deleted

## 2020-11-01 MED ORDER — PRENATAL VITAMIN 27-0.8 MG PO TABS: 1.0000 | ORAL_TABLET | Freq: Every day | ORAL | 12 refills | Status: DC

## 2020-11-01 NOTE — Telephone Encounter (Signed)
Pt left VM message requesting refill of prenatal vitamins. I called pt and confirmed her preferred pharmacy. Rx e-prescribed. Pt voiced understanding.

## 2020-11-09 ENCOUNTER — Encounter: Payer: Self-pay | Admitting: *Deleted

## 2020-11-13 ENCOUNTER — Ambulatory Visit (INDEPENDENT_AMBULATORY_CARE_PROVIDER_SITE_OTHER): Payer: Medicaid Other | Admitting: Nurse Practitioner

## 2020-11-13 ENCOUNTER — Other Ambulatory Visit: Payer: Self-pay

## 2020-11-13 VITALS — BP 135/78 | HR 85 | Wt 394.0 lb

## 2020-11-13 DIAGNOSIS — Z6841 Body Mass Index (BMI) 40.0 and over, adult: Secondary | ICD-10-CM

## 2020-11-13 DIAGNOSIS — J4520 Mild intermittent asthma, uncomplicated: Secondary | ICD-10-CM

## 2020-11-13 DIAGNOSIS — O0990 Supervision of high risk pregnancy, unspecified, unspecified trimester: Secondary | ICD-10-CM

## 2020-11-13 DIAGNOSIS — O99210 Obesity complicating pregnancy, unspecified trimester: Secondary | ICD-10-CM

## 2020-11-13 DIAGNOSIS — O98112 Syphilis complicating pregnancy, second trimester: Secondary | ICD-10-CM

## 2020-11-13 DIAGNOSIS — R87612 Low grade squamous intraepithelial lesion on cytologic smear of cervix (LGSIL): Secondary | ICD-10-CM

## 2020-11-13 DIAGNOSIS — Z3A19 19 weeks gestation of pregnancy: Secondary | ICD-10-CM

## 2020-11-13 DIAGNOSIS — O10919 Unspecified pre-existing hypertension complicating pregnancy, unspecified trimester: Secondary | ICD-10-CM

## 2020-11-13 MED ORDER — ALBUTEROL SULFATE HFA 108 (90 BASE) MCG/ACT IN AERS: 2.0000 | INHALATION_SPRAY | Freq: Four times a day (QID) | RESPIRATORY_TRACT | 2 refills | Status: DC | PRN

## 2020-11-13 NOTE — Progress Notes (Signed)
   Subjective:   Brandy Brock is a 25 y.o. G1P0000 at [redacted]w[redacted]d by early ultrasound (hx of irregular menses) being seen today for her first obstetrical visit.  Her obstetrical history is significant for obesity and history of hypertension, diagnosed and treated for syphillis in this pregnancy. Patient does intend to breast feed. Pregnancy history fully reviewed.  This is a planned pregnancy.  Patient reports no complaints.  HISTORY: OB History  Gravida Para Term Preterm AB Living  1 0 0 0 0 0  SAB IAB Ectopic Multiple Live Births  0 0 0 0 0    # Outcome Date GA Lbr Len/2nd Weight Sex Delivery Anes PTL Lv  1 Current            Past Medical History:  Diagnosis Date  . Asthma    childhood  . Hypertension   . Syphilis affecting pregnancy   . Vaginal Pap smear, abnormal    Past Surgical History:  Procedure Laterality Date  . MULTIPLE EXTRACTIONS WITH ALVEOLOPLASTY N/A 10/11/2016   Procedure: MULTIPLE EXTRACTION WITH ALVEOLOPLASTY;  Surgeon: Scott Jensen, DDS;  Location: [facility] OR;  Service: Oral Surgery;  Laterality: N/A;   Family History  Problem Relation Age of Onset  . Diabetes Father   . Diabetes Brother    Social History   Tobacco Use  . Smoking status: Never Smoker  . Smokeless tobacco: Former User  Vaping Use  . Vaping Use: Never used  Substance Use Topics  . Alcohol use: No  . Drug use: No   Allergies  Allergen Reactions  . Peanut-Containing Drug Products Itching and Swelling    REACTION: Facial itching and swelling.   Current Outpatient Medications on File Prior to Visit  Medication Sig Dispense Refill  . aspirin EC 81 MG tablet Take 81 mg by mouth daily. Swallow whole.    . Blood Pressure Monitoring (BLOOD PRESSURE KIT) DEVI 1 Device by Does not apply route as needed. 1 each 0  . Prenatal Vit-Fe Fumarate-FA (PRENATAL VITAMIN) 27-0.8 MG TABS Take 1 tablet by mouth daily. 30 tablet 12   No current facility-administered medications on file prior to visit.      Exam   Vitals:   11/13/20 0917  BP: 135/78  Pulse: 85  Weight: (!) 394 lb (178.7 kg)   Fetal Heart Rate (bpm): 145  Uterus:     Pelvic Exam: Perineum: Deferred pelvic   Vulva:    Vagina:     Cervix:    Adnexa:    Bony Pelvis:   System: General: well-developed, well-nourished female in no acute distress   Breast:  normal appearance, no masses or tenderness   Skin: normal coloration and turgor, no rashes   Neurologic: oriented, normal, negative, normal mood   Extremities: normal strength, tone, and muscle mass, ROM of all joints is normal   HEENT extraocular movement intact and sclera clear, anicteric   Mouth/Teeth deferred   Neck supple and no masses, normal thyroid   Cardiovascular: regular rate and rhythm   Respiratory:  no respiratory distress, normal breath sounds   Abdomen: soft, non-tender; no masses,  no organomegaly     Assessment:   Pregnancy: G1P0000 Patient Active Problem List   Diagnosis Date Noted  . Supervision of high risk pregnancy, antepartum 10/26/2020  . LGSIL on Pap smear of cervix 10/26/2020  . Syphilis affecting pregnancy   . Obesity in pregnancy, antepartum   . Chronic hypertension affecting pregnancy   . Vagina bleeding 04/13/2019  .   BMI 60.0-69.9, adult ([place]) 10/13/2008  . Asthma 10/13/2008     Plan:  1. Supervision of high risk pregnancy, antepartum Will need to sign up for MyChart - advised info is on check out info today Encouraged to get Covid vaccine  - [place] MFM OB DETAIL +14 WK; Future  2. Obesity in pregnancy, antepartum Referral to nutritionist for healthy diet info  3. Mild intermittent asthma, unspecified whether complicated Rarely uses inhaler unless she is ill.  Does not know where her last inhaler is.  Prescribed new inhaler.  4. Syphilis affecting pregnancy in second trimester Has had treatment [facility] the [facility]  5. Chronic hypertension affecting pregnancy Taking low dose aspirin Did  not draw baseline labs today - note made on chart to draw at next visit Has BP cuff at home and encouraged to take weekly and put readings into Babyscripts  6. BMI 60.0-69.9, adult ([place])  - Amb Referral to Nutrition and Diabetic E   Initial labs drawn. Continue prenatal vitamins. Genetic Screening discussed, NIPS: not done today. Ultrasound discussed; fetal anatomic survey: ordered. Problem list reviewed and updated. The nature of [facility] with multiple MDs and other Advanced Practice Providers was explained to patient; also emphasized that residents, students are part of our team. Routine obstetric precautions reviewed. Return in about 4 weeks (around 12/11/2020).  Total face-to-face time with patient: 40 minutes.  Over 50% of encounter was spent on counseling and coordination of care.     Earlie Server, FNP Family Nurse Practitioner, [facility] for [employer], [place] 11/13/2020 11:21 AM

## 2020-11-13 NOTE — Patient Instructions (Signed)
[email or web address] for childbirth and breastfeeding    Safe Medications in Pregnancy   Acne: Benzoyl Peroxide Salicylic Acid  Backache/Headache: Tylenol: 2 regular strength every 4 hours OR              2 Extra strength every 6 hours  Colds/Coughs/Allergies: Benadryl (alcohol free) 25 mg every 6 hours as needed Breath right strips Claritin Cepacol throat lozenges Chloraseptic throat spray Cold-Eeze- up to three times per day Cough drops, alcohol free Flonase (by prescription only) Guaifenesin Mucinex Robitussin DM (plain only, alcohol free) Saline nasal spray/drops Sudafed (pseudoephedrine) & Actifed ** use only after [redacted] weeks gestation and if you do not have high blood pressure Tylenol Vicks Vaporub Zinc lozenges Zyrtec   Constipation: Colace Ducolax suppositories Fleet enema Glycerin suppositories Metamucil Milk of magnesia Miralax Senokot Smooth move tea  Diarrhea: Kaopectate Imodium A-D  *NO pepto Bismol  Hemorrhoids: Anusol Anusol HC Preparation H Tucks  Indigestion: Tums Maalox Mylanta Zantac  Pepcid  Insomnia: Benadryl (alcohol free) 25mg  every 6 hours as needed Tylenol PM Unisom, no Gelcaps  Leg Cramps: Tums MagGel  Nausea/Vomiting:  Bonine Dramamine Emetrol Ginger extract Sea bands Meclizine  Nausea medication to take during pregnancy:  Unisom (doxylamine succinate 25 mg tablets) Take one tablet daily at bedtime. If symptoms are not adequately controlled, the dose can be increased to a maximum recommended dose of two tablets daily (1/2 tablet in the morning, 1/2 tablet mid-afternoon and one at bedtime). Vitamin B6 100mg  tablets. Take one tablet twice a day (up to 200 mg per day).  Skin Rashes: Aveeno products Benadryl cream or 25mg  every 6 hours as needed Calamine Lotion 1% cortisone cream  Yeast infection: Gyne-lotrimin 7 Monistat 7   **If taking multiple medications, please check labels to avoid duplicating the  same active ingredients **take medication as directed on the label ** Do not exceed 4000 mg of tylenol in 24 hours **Do not take medications that contain aspirin or ibuprofen

## 2020-11-16 ENCOUNTER — Ambulatory Visit: Payer: Medicaid Other | Admitting: Registered"

## 2020-11-16 ENCOUNTER — Encounter: Payer: Medicaid Other | Attending: Nurse Practitioner | Admitting: Registered"

## 2020-11-16 ENCOUNTER — Other Ambulatory Visit: Payer: Self-pay

## 2020-11-16 DIAGNOSIS — Z713 Dietary counseling and surveillance: Secondary | ICD-10-CM | POA: Insufficient documentation

## 2020-11-16 DIAGNOSIS — Z6841 Body Mass Index (BMI) 40.0 and over, adult: Secondary | ICD-10-CM | POA: Diagnosis not present

## 2020-11-16 NOTE — Progress Notes (Signed)
Medical Nutrition Therapy  Appointment Start time:  1420  Appointment End time:  1510  Primary concerns today: healthy eating  Referral diagnosis: healthy eating during pregnancy Preferred learning style: no preference indicated Learning readiness: ready   NUTRITION ASSESSMENT   Anthropometrics  Wt Readings from Last 3 Encounters:  11/13/20 (!) 394 lb (178.7 kg)  08/23/19 (!) 380 lb (172.4 kg)  04/13/19 (!) 392 lb 8 oz (178 kg)    Clinical Medical Hx: reviewed Medications: prenatal vitamins, aspirin Labs: n/a Notable Signs/Symptoms: less energy, irregular periods since menarche  Lifestyle & Dietary Hx Patient may have PCOS. Pt states she had not used birth control for over 1 year and states history of irregular periods. Pt states after she started a keto diet and lost some weight she conceived. Pt states she has a gym membership and prior to pregnancy she was going to the gym for ~45 min at a time.  Pt states since pregnancy she has had reduced appetite and reduced energy. 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 Pt states she sleeps 9pm-7:30 am but feels it is restless sleep and doesn't feel rested.   Pt states she eats her meals on her bed or on the couch watching TV. Pt states she doesn't like left overs. Pt reports she is interested in meal prepping.  Estimated daily fluid intake: 100 oz Supplements: not assessed Sleep: 10+ hrs but not restful sleep Stress / self-care: not assessed Current average weekly physical activity: ADLs, has stairs to and in her apartment.  24-Hr Dietary Recall First Meal: 2 pieces of bacon, grits or oatmeal, fruit Snack: none Second Meal: sandwich OR salad, crackers Or fastfood when at work and needs Oceanographer: none Third Meal: soul food Snack: none or apple sauce, stopped eating granola bars Beverages: water  Estimated Energy Needs Calories: 2500   NUTRITION DIAGNOSIS  NI-5.8.1 Inadequate carbohydrate intake and NI-5.8.5 Inadeqate fiber  intake As related to limted vegetable and bean intake.  As evidenced by dietary recall, stated not eating vegetables daily.   NUTRITION INTERVENTION  Nutrition education (E-1) on the following topics:  . MyPlate for Moms . Importance of sleep and sleep routine . Exercise . Potential for PCOS   Handouts Provided Include   Anatomy of a Grain Bowl  MyPlate for Moms  Learning Style & Readiness for Change Teaching method utilized: Visual & Auditory  Demonstrated degree of understanding via: Teach Back  Barriers to learning/adherence to lifestyle change: none  Goals Established by Pt . Include vegetables/beans/whole grains daily into diet . Include more yogurt or other dairy products . Continue eating lean meats . Increase physical activity . Work on a bedtime routine to get better sleep   MONITORING & EVALUATION Dietary intake, weekly physical activity, and energy level PRN  Next Steps  Patient is to work on goals and return for follow-up as desired. Patient given card to follow-up if she is diagnosed with PCOS.

## 2020-11-17 ENCOUNTER — Encounter (HOSPITAL_COMMUNITY): Payer: Self-pay | Admitting: Obstetrics and Gynecology

## 2020-11-17 ENCOUNTER — Inpatient Hospital Stay (HOSPITAL_COMMUNITY): Payer: Medicaid Other

## 2020-11-17 ENCOUNTER — Inpatient Hospital Stay (HOSPITAL_COMMUNITY)
Admission: AD | Admit: 2020-11-17 | Discharge: 2020-11-17 | Disposition: A | Discharge status: Home / Self Care | Payer: Medicaid Other | Attending: Obstetrics and Gynecology | Admitting: Obstetrics and Gynecology

## 2020-11-17 ENCOUNTER — Other Ambulatory Visit: Payer: Self-pay

## 2020-11-17 DIAGNOSIS — Z7982 Long term (current) use of aspirin: Secondary | ICD-10-CM | POA: Insufficient documentation

## 2020-11-17 DIAGNOSIS — R1030 Lower abdominal pain, unspecified: Secondary | ICD-10-CM

## 2020-11-17 DIAGNOSIS — O99891 Other specified diseases and conditions complicating pregnancy: Secondary | ICD-10-CM

## 2020-11-17 DIAGNOSIS — Z3A20 20 weeks gestation of pregnancy: Secondary | ICD-10-CM | POA: Insufficient documentation

## 2020-11-17 DIAGNOSIS — R1011 Right upper quadrant pain: Secondary | ICD-10-CM | POA: Insufficient documentation

## 2020-11-17 DIAGNOSIS — Z79899 Other long term (current) drug therapy: Secondary | ICD-10-CM | POA: Diagnosis not present

## 2020-11-17 DIAGNOSIS — O26892 Other specified pregnancy related conditions, second trimester: Secondary | ICD-10-CM | POA: Diagnosis not present

## 2020-11-17 DIAGNOSIS — R1013 Epigastric pain: Secondary | ICD-10-CM | POA: Diagnosis not present

## 2020-11-17 LAB — CBC WITH DIFFERENTIAL/PLATELET
Abs Immature Granulocytes: 0.08 10*3/uL — ABNORMAL HIGH (ref 0.00–0.07)
Basophils Absolute: 0 10*3/uL (ref 0.0–0.1)
Basophils Relative: 0 %
Eosinophils Absolute: 0.1 10*3/uL (ref 0.0–0.5)
Eosinophils Relative: 0 %
HCT: 35.2 % — ABNORMAL LOW (ref 36.0–46.0)
Hemoglobin: 12.2 g/dL (ref 12.0–15.0)
Immature Granulocytes: 1 %
Lymphocytes Relative: 18 %
Lymphs Abs: 2.4 10*3/uL (ref 0.7–4.0)
MCH: 30.8 pg (ref 26.0–34.0)
MCHC: 34.7 g/dL (ref 30.0–36.0)
MCV: 88.9 fL (ref 80.0–100.0)
Monocytes Absolute: 0.8 10*3/uL (ref 0.1–1.0)
Monocytes Relative: 6 %
Neutro Abs: 10.4 10*3/uL — ABNORMAL HIGH (ref 1.7–7.7)
Neutrophils Relative %: 75 %
Platelets: 192 10*3/uL (ref 150–400)
RBC: 3.96 MIL/uL (ref 3.87–5.11)
RDW: 12.9 % (ref 11.5–15.5)
WBC: 13.8 10*3/uL — ABNORMAL HIGH (ref 4.0–10.5)
nRBC: 0 % (ref 0.0–0.2)

## 2020-11-17 LAB — URINALYSIS, ROUTINE W REFLEX MICROSCOPIC
Bilirubin Urine: NEGATIVE
Glucose, UA: NEGATIVE mg/dL
Hgb urine dipstick: NEGATIVE
Ketones, ur: 80 mg/dL — AB
Leukocytes,Ua: NEGATIVE
Nitrite: NEGATIVE
Protein, ur: NEGATIVE mg/dL
Specific Gravity, Urine: 1.019 (ref 1.005–1.030)
pH: 7 (ref 5.0–8.0)

## 2020-11-17 LAB — COMPREHENSIVE METABOLIC PANEL
ALT: 58 U/L — ABNORMAL HIGH (ref 0–44)
AST: 43 U/L — ABNORMAL HIGH (ref 15–41)
Albumin: 3.1 g/dL — ABNORMAL LOW (ref 3.5–5.0)
Alkaline Phosphatase: 39 U/L (ref 38–126)
Anion gap: 9 (ref 5–15)
BUN: <5 mg/dL — ABNORMAL LOW (ref 6–20)
CO2: 23 mmol/L (ref 22–32)
Calcium: 9.4 mg/dL (ref 8.9–10.3)
Chloride: 103 mmol/L (ref 98–111)
Creatinine, Ser: 0.58 mg/dL (ref 0.44–1.00)
GFR, Estimated: >60 mL/min (ref >60)
Glucose, Bld: 80 mg/dL (ref 70–99)
Potassium: 3.7 mmol/L (ref 3.5–5.1)
Sodium: 135 mmol/L (ref 135–145)
Total Bilirubin: 0.7 mg/dL (ref 0.3–1.2)
Total Protein: 6.9 g/dL (ref 6.5–8.1)

## 2020-11-17 LAB — WET PREP, GENITAL
Clue Cells Wet Prep HPF POC: NONE SEEN
Sperm: NONE SEEN
Trich, Wet Prep: NONE SEEN
Yeast Wet Prep HPF POC: NONE SEEN

## 2020-11-17 LAB — LIPASE, BLOOD: Lipase: 22 U/L (ref 11–51)

## 2020-11-17 LAB — AMYLASE: Amylase: 64 U/L (ref 28–100)

## 2020-11-17 MED ORDER — SODIUM CHLORIDE 0.9 % IV BOLUS
250.0000 mL | Freq: Once | INTRAVENOUS | Status: AC
  Administered 2020-11-17: 250 mL via INTRAVENOUS

## 2020-11-17 MED ORDER — SODIUM CHLORIDE 0.9 % IV SOLN
8.0000 mg | Freq: Once | INTRAVENOUS | Status: AC
  Administered 2020-11-17: 8 mg via INTRAVENOUS
  Filled 2020-11-17: qty 4

## 2020-11-17 MED ORDER — PANTOPRAZOLE SODIUM 40 MG IV SOLR
40.0000 mg | Freq: Once | INTRAVENOUS | Status: AC
  Administered 2020-11-17: 40 mg via INTRAVENOUS
  Filled 2020-11-17: qty 40

## 2020-11-17 NOTE — MAU Provider Note (Incomplete Revision)
History     CSN: [id number]  Arrival date and time: 11/17/20 1622   Event Date/Time   First Provider Initiated Contact with Patient 11/17/20 1820      Chief Complaint  Patient presents with  . Abdominal Pain   Ms. Brandy Brock is a 25 y.o. G1P0000 at 48w1dwho presents to MAU for lower and upper abdominal pain.  Patient reports lower abdominal pain started about one week ago and she "believes its normal." Patient states she believes it is normal because it is "growing pains." Patient reports pain is worse with movement and is on the sides of abdomen/pelvis and is dull. Patient reports pain is minimally present at this time.  Patient reports her upper abdominal pain is in the epigastric region and started this morning and was accompanied by nausea and vomiting. Patient reports the pain is made worse by movement and worse by laying on her left hand side. Patient reports pain is also worse with eating, but then clarifies that the pain does not intensify, but it makes it worse because she vomits afterwards. Patient reports she vomited twice today. Patient reports pain is made better by laying on stomach. Patient has not taken anything for pain.  Pt denies VB, vaginal discharge/odor/itching. Pt denies constipation, diarrhea, or urinary problems. Pt denies fever, chills, fatigue, sweating or changes in appetite. Pt denies SOB or chest pain. Pt denies dizziness, HA, light-headedness, weakness.  Problems this pregnancy include: cHTN, syphilis this pregnancy. Allergies? peanuts Current medications/supplements? Albuterol PRN, low dose ASA, PNV [facility] care provider[facility], next appt 11/27/2020   OB History     Gravida  1   Para  0   Term  0   Preterm  0   AB  0   Living  0      SAB  0   IAB  0   Ectopic  0   Multiple  0   Live Births  0           Past Medical History:  Diagnosis Date  . Asthma    childhood  . Hypertension   . Syphilis affecting  pregnancy   . Vaginal Pap smear, abnormal     Past Surgical History:  Procedure Laterality Date  . MULTIPLE EXTRACTIONS WITH ALVEOLOPLASTY N/A 10/11/2016   Procedure: MULTIPLE EXTRACTION WITH ALVEOLOPLASTY;  Surgeon: SDiona Browner DDS;  [facility]  Service: Oral Surgery;  Laterality: N/A;    Family History  Problem Relation Age of Onset  . Diabetes Father   . Diabetes Brother     Social History   Tobacco Use  . Smoking status: Never Smoker  . Smokeless tobacco: Former UNetwork engineer . Vaping Use: Never used  Substance Use Topics  . Alcohol use: No  . Drug use: No    Allergies:  Allergies  Allergen Reactions  . Peanut-Containing Drug Products Itching and Swelling    REACTION: Facial itching and swelling.    Medications Prior to Admission  Medication Sig Dispense Refill Last Dose  . aspirin EC 81 MG tablet Take 81 mg by mouth daily. Swallow whole.   11/16/2020 at Unknown time  . Prenatal Vit-Fe Fumarate-FA (PRENATAL VITAMIN) 27-0.8 MG TABS Take 1 tablet by mouth daily. 30 tablet 12 11/16/2020 at Unknown time  . albuterol (VENTOLIN HFA) 108 (90 Base) MCG/ACT inhaler Inhale 2 puffs into the lungs every 6 (six) hours as needed for wheezing or shortness of breath. 8 g 2   .  Blood Pressure Monitoring (BLOOD PRESSURE KIT) DEVI 1 Device by Does not apply route as needed. 1 each 0     Review of Systems  Constitutional: Negative for chills, diaphoresis, fatigue and fever.  Eyes: Negative for visual disturbance.  Respiratory: Negative for shortness of breath.   Cardiovascular: Negative for chest pain.  Gastrointestinal: Positive for abdominal pain, nausea and vomiting. Negative for constipation and diarrhea.  Genitourinary: Positive for pelvic pain. Negative for dysuria, flank pain, frequency, urgency, vaginal bleeding and vaginal discharge.  Neurological: Negative for dizziness, weakness, light-headedness and headaches.   Physical Exam   Blood pressure 139/78, pulse 90,  temperature 97.9 F (36.6 C), temperature source Oral, resp. rate 18, last menstrual period 06/03/2020.  Patient Vitals for the past 24 hrs:  BP Temp Temp src Pulse Resp  11/17/20 1744 139/78 97.9 F (36.6 C) Oral 90 18   Physical Exam Vitals and nursing note reviewed. Exam conducted with a chaperone present.  Constitutional:      Appearance: Normal appearance.  HENT:     Head: Normocephalic and atraumatic.  Pulmonary:     Effort: Pulmonary effort is normal.  Genitourinary:    General: Normal vulva.     Labia:        Right: No rash, tenderness or lesion.        Left: No rash, tenderness or lesion.   Skin:    General: Skin is warm and dry.  Neurological:     Mental Status: She is alert and oriented to person, place, and time.  Psychiatric:        Mood and Affect: Mood normal.        Behavior: Behavior normal.        Thought Content: Thought content normal.        Judgment: Judgment normal.    Results for orders placed or performed during the hospital encounter of 11/17/20 (from the past 24 hour(s))  Urinalysis, Routine w reflex microscopic Urine, Clean Catch     Status: Abnormal   Collection Time: 11/17/20  5:50 PM  Result Value Ref Range   Color, Urine YELLOW YELLOW   APPearance HAZY (A) CLEAR   Specific Gravity, Urine 1.019 1.005 - 1.030   pH 7.0 5.0 - 8.0   Glucose, UA NEGATIVE NEGATIVE mg/dL   Hgb urine dipstick NEGATIVE NEGATIVE   Bilirubin Urine NEGATIVE NEGATIVE   Ketones, ur 80 (A) NEGATIVE mg/dL   Protein, ur NEGATIVE NEGATIVE mg/dL   Nitrite NEGATIVE NEGATIVE   Leukocytes,Ua NEGATIVE NEGATIVE  CBC with Differential/Platelet     Status: Abnormal   Collection Time: 11/17/20  7:05 PM  Result Value Ref Range   WBC 13.8 (H) 4.0 - 10.5 K/uL   RBC 3.96 3.87 - 5.11 MIL/uL   Hemoglobin 12.2 12.0 - 15.0 g/dL   HCT 35.2 (L) 36.0 - 46.0 %   MCV 88.9 80.0 - 100.0 fL   MCH 30.8 26.0 - 34.0 pg   MCHC 34.7 30.0 - 36.0 g/dL   RDW 12.9 11.5 - 15.5 %   Platelets 192  150 - 400 K/uL   nRBC 0.0 0.0 - 0.2 %   Neutrophils Relative % 75 %   Neutro Abs 10.4 (H) 1.7 - 7.7 K/uL   Lymphocytes Relative 18 %   Lymphs Abs 2.4 0.7 - 4.0 K/uL   Monocytes Relative 6 %   Monocytes Absolute 0.8 0.1 - 1.0 K/uL   Eosinophils Relative 0 %   Eosinophils Absolute 0.1 0.0 - 0.5 K/uL  Basophils Relative 0 %   Basophils Absolute 0.0 0.0 - 0.1 K/uL   Immature Granulocytes 1 %   Abs Immature Granulocytes 0.08 (H) 0.00 - 0.07 K/uL  Comprehensive metabolic panel     Status: Abnormal   Collection Time: 11/17/20  7:05 PM  Result Value Ref Range   Sodium 135 135 - 145 mmol/L   Potassium 3.7 3.5 - 5.1 mmol/L   Chloride 103 98 - 111 mmol/L   CO2 23 22 - 32 mmol/L   Glucose, Bld 80 70 - 99 mg/dL   BUN <5 (L) 6 - 20 mg/dL   Creatinine, Ser 0.58 0.44 - 1.00 mg/dL   Calcium 9.4 8.9 - 10.3 mg/dL   Total Protein 6.9 6.5 - 8.1 g/dL   Albumin 3.1 (L) 3.5 - 5.0 g/dL   AST 43 (H) 15 - 41 U/L   ALT 58 (H) 0 - 44 U/L   Alkaline Phosphatase 39 38 - 126 U/L   Total Bilirubin 0.7 0.3 - 1.2 mg/dL   GFR, Estimated >60 >60 mL/min   Anion gap 9 5 - 15  Amylase     Status: None   Collection Time: 11/17/20  7:05 PM  Result Value Ref Range   Amylase 64 28 - 100 U/L  Lipase, blood     Status: None   Collection Time: 11/17/20  7:05 PM  Result Value Ref Range   Lipase 22 11 - 51 U/L  Wet prep, genital     Status: Abnormal   Collection Time: 11/17/20  7:22 PM  Result Value Ref Range   Yeast Wet Prep HPF POC NONE SEEN NONE SEEN   Trich, Wet Prep NONE SEEN NONE SEEN   Clue Cells Wet Prep HPF POC NONE SEEN NONE SEEN   WBC, Wet Prep HPF POC FEW (A) NONE SEEN   Sperm NONE SEEN    No results found.  MAU Course  Procedures  MDM -suspect RLP, r/o PTL -UA: hazy/80 ketones, sending urine for culture based on symptoms -CE: difficult to assess d/t body habitus -WetPrep: WNL -GC/CT collected -FHT 155   -N/V x2 today with epigastric pain -CBC w/ Diff: no abnormalities requiring  treatment -CMP: AST/ALT 43/58 -Amylase: 64 (WNL) -Lipase: 22 (WNL) -care transferred to K. Ardean Larsen, CNM; RUQ [place], Protonix/Zofran pending Nugent, Gerrie Nordmann, NP  8:54 PM 11/17/2020   Orders Placed This Encounter  Procedures  . Wet prep, genital    Standing Status:   Standing    Number of Occurrences:   1  . Culture, OB Urine    Standing Status:   Standing    Number of Occurrences:   1  . US Abdomen Limited RUQ (LIVER/GB)    Standing Status:   Standing    Number of Occurrences:   1    Order Specific Question:   Symptom/Reason for Exam    Answer:   Epigastric pain [[id number]]  . Urinalysis, Routine w reflex microscopic Urine, Clean Catch    Standing Status:   Standing    Number of Occurrences:   1  . CBC with Differential/Platelet    Standing Status:   Standing    Number of Occurrences:   1  . Comprehensive metabolic panel    Standing Status:   Standing    Number of Occurrences:   1  . Amylase    Standing Status:   Standing    Number of Occurrences:   1  . Lipase, blood    Standing Status:  Standing    Number of Occurrences:   1  . Protein / creatinine ratio, urine    Standing Status:   Standing    Number of Occurrences:   1  . Insert peripheral IV    Standing Status:   Standing    Number of Occurrences:   1   Meds ordered this encounter  Medications  . pantoprazole (PROTONIX) injection 40 mg  . ondansetron (ZOFRAN) 8 mg in sodium chloride 0.9 % 50 mL IVPB    Assessment and Plan   1. Lower abdominal pain   2. Epigastric pain   -Patient's RUQ [place] is normal, patient back in room and reports that her pain is resolved and she has no nausea.  -

## 2020-11-17 NOTE — MAU Note (Addendum)
..  Brandy Brock is a 25 y.o. at [redacted]w[redacted]d here in MAU reporting: Abdominal pain and nausea/vomiting. The abdominal pain is lower and upper, that fluctuates in intensity from 5/10 to 2/10. The pain feels sharp and is aggravated by movement and when she eats. It subsides when she lays on her stomach. Has not taken anything for the pain.  The vomiting began today and she has vomited 2 times and has been nauseous all day.  Denies vaginal bleeding or abnormal vaginal discharge. Pain score: 2/10 Vitals:   11/17/20 1744  BP: 139/78  Pulse: 90  Resp: 18  Temp: 97.9 F (36.6 C)     FHT: 155

## 2020-11-17 NOTE — MAU Provider Note (Addendum)
History     CSN: [id number]  Arrival date and time: 11/17/20 1622   Event Date/Time   First Provider Initiated Contact with Patient 11/17/20 1820      Chief Complaint  Patient presents with  . Abdominal Pain   Brandy Brock is a 25 y.o. G1P0000 at 13w1dwho presents to MAU for lower and upper abdominal pain.  Patient reports lower abdominal pain started about one week ago and she "believes its normal." Patient states she believes it is normal because it is "growing pains." Patient reports pain is worse with movement and is on the sides of abdomen/pelvis and is dull. Patient reports pain is minimally present at this time.  Patient reports her upper abdominal pain is in the epigastric region and started this morning and was accompanied by nausea and vomiting. Patient reports the pain is made worse by movement and worse by laying on her left hand side. Patient reports pain is also worse with eating, but then clarifies that the pain does not intensify, but it makes it worse because she vomits afterwards. Patient reports she vomited twice today. Patient reports pain is made better by laying on stomach. Patient has not taken anything for pain.  Pt denies VB, vaginal discharge/odor/itching. Pt denies constipation, diarrhea, or urinary problems. Pt denies fever, chills, fatigue, sweating or changes in appetite. Pt denies SOB or chest pain. Pt denies dizziness, HA, light-headedness, weakness.  Problems this pregnancy include: cHTN, syphilis this pregnancy. Allergies? peanuts Current medications/supplements? Albuterol PRN, low dose ASA, PNV [facility] care provider[facility], next appt 11/27/2020   OB History     Gravida  1   Para  0   Term  0   Preterm  0   AB  0   Living  0      SAB  0   IAB  0   Ectopic  0   Multiple  0   Live Births  0           Past Medical History:  Diagnosis Date  . Asthma    childhood  . Hypertension   . Syphilis affecting  pregnancy   . Vaginal Pap smear, abnormal     Past Surgical History:  Procedure Laterality Date  . MULTIPLE EXTRACTIONS WITH ALVEOLOPLASTY N/A 10/11/2016   Procedure: MULTIPLE EXTRACTION WITH ALVEOLOPLASTY;  Surgeon: SDiona Browner DDS;  Location: [facility]  Service: Oral Surgery;  Laterality: N/A;    Family History  Problem Relation Age of Onset  . Diabetes Father   . Diabetes Brother     Social History   Tobacco Use  . Smoking status: Never Smoker  . Smokeless tobacco: Former UNetwork engineer . Vaping Use: Never used  Substance Use Topics  . Alcohol use: No  . Drug use: No    Allergies:  Allergies  Allergen Reactions  . Peanut-Containing Drug Products Itching and Swelling    REACTION: Facial itching and swelling.    Medications Prior to Admission  Medication Sig Dispense Refill Last Dose  . aspirin EC 81 MG tablet Take 81 mg by mouth daily. Swallow whole.   11/16/2020 at Unknown time  . Prenatal Vit-Fe Fumarate-FA (PRENATAL VITAMIN) 27-0.8 MG TABS Take 1 tablet by mouth daily. 30 tablet 12 11/16/2020 at Unknown time  . albuterol (VENTOLIN HFA) 108 (90 Base) MCG/ACT inhaler Inhale 2 puffs into the lungs every 6 (six) hours as needed for wheezing or shortness of breath. 8 g 2   .  Blood Pressure Monitoring (BLOOD PRESSURE KIT) DEVI 1 Device by Does not apply route as needed. 1 each 0     Review of Systems  Constitutional: Negative for chills, diaphoresis, fatigue and fever.  Eyes: Negative for visual disturbance.  Respiratory: Negative for shortness of breath.   Cardiovascular: Negative for chest pain.  Gastrointestinal: Positive for abdominal pain, nausea and vomiting. Negative for constipation and diarrhea.  Genitourinary: Positive for pelvic pain. Negative for dysuria, flank pain, frequency, urgency, vaginal bleeding and vaginal discharge.  Neurological: Negative for dizziness, weakness, light-headedness and headaches.   Physical Exam   Blood pressure 139/78, pulse 90,  temperature 97.9 F (36.6 C), temperature source Oral, resp. rate 18, last menstrual period 06/03/2020.  Patient Vitals for the past 24 hrs:  BP Temp Temp src Pulse Resp  11/17/20 1744 139/78 97.9 F (36.6 C) Oral 90 18   Physical Exam Vitals and nursing note reviewed. Exam conducted with a chaperone present.  Constitutional:      Appearance: Normal appearance.  HENT:     Head: Normocephalic and atraumatic.  Pulmonary:     Effort: Pulmonary effort is normal.  Genitourinary:    General: Normal vulva.     Labia:        Right: No rash, tenderness or lesion.        Left: No rash, tenderness or lesion.   Skin:    General: Skin is warm and dry.  Neurological:     Mental Status: She is alert and oriented to person, place, and time.  Psychiatric:        Mood and Affect: Mood normal.        Behavior: Behavior normal.        Thought Content: Thought content normal.        Judgment: Judgment normal.    Results for orders placed or performed during the hospital encounter of 11/17/20 (from the past 24 hour(s))  Urinalysis, Routine w reflex microscopic Urine, Clean Catch     Status: Abnormal   Collection Time: 11/17/20  5:50 PM  Result Value Ref Range   Color, Urine YELLOW YELLOW   APPearance HAZY (A) CLEAR   Specific Gravity, Urine 1.019 1.005 - 1.030   pH 7.0 5.0 - 8.0   Glucose, UA NEGATIVE NEGATIVE mg/dL   Hgb urine dipstick NEGATIVE NEGATIVE   Bilirubin Urine NEGATIVE NEGATIVE   Ketones, ur 80 (A) NEGATIVE mg/dL   Protein, ur NEGATIVE NEGATIVE mg/dL   Nitrite NEGATIVE NEGATIVE   Leukocytes,Ua NEGATIVE NEGATIVE  CBC with Differential/Platelet     Status: Abnormal   Collection Time: 11/17/20  7:05 PM  Result Value Ref Range   WBC 13.8 (H) 4.0 - 10.5 K/uL   RBC 3.96 3.87 - 5.11 MIL/uL   Hemoglobin 12.2 12.0 - 15.0 g/dL   HCT 35.2 (L) 36.0 - 46.0 %   MCV 88.9 80.0 - 100.0 fL   MCH 30.8 26.0 - 34.0 pg   MCHC 34.7 30.0 - 36.0 g/dL   RDW 12.9 11.5 - 15.5 %   Platelets 192  150 - 400 K/uL   nRBC 0.0 0.0 - 0.2 %   Neutrophils Relative % 75 %   Neutro Abs 10.4 (H) 1.7 - 7.7 K/uL   Lymphocytes Relative 18 %   Lymphs Abs 2.4 0.7 - 4.0 K/uL   Monocytes Relative 6 %   Monocytes Absolute 0.8 0.1 - 1.0 K/uL   Eosinophils Relative 0 %   Eosinophils Absolute 0.1 0.0 - 0.5 K/uL  Basophils Relative 0 %   Basophils Absolute 0.0 0.0 - 0.1 K/uL   Immature Granulocytes 1 %   Abs Immature Granulocytes 0.08 (H) 0.00 - 0.07 K/uL  Comprehensive metabolic panel     Status: Abnormal   Collection Time: 11/17/20  7:05 PM  Result Value Ref Range   Sodium 135 135 - 145 mmol/L   Potassium 3.7 3.5 - 5.1 mmol/L   Chloride 103 98 - 111 mmol/L   CO2 23 22 - 32 mmol/L   Glucose, Bld 80 70 - 99 mg/dL   BUN <5 (L) 6 - 20 mg/dL   Creatinine, Ser 0.58 0.44 - 1.00 mg/dL   Calcium 9.4 8.9 - 10.3 mg/dL   Total Protein 6.9 6.5 - 8.1 g/dL   Albumin 3.1 (L) 3.5 - 5.0 g/dL   AST 43 (H) 15 - 41 U/L   ALT 58 (H) 0 - 44 U/L   Alkaline Phosphatase 39 38 - 126 U/L   Total Bilirubin 0.7 0.3 - 1.2 mg/dL   GFR, Estimated >60 >60 mL/min   Anion gap 9 5 - 15  Amylase     Status: None   Collection Time: 11/17/20  7:05 PM  Result Value Ref Range   Amylase 64 28 - 100 U/L  Lipase, blood     Status: None   Collection Time: 11/17/20  7:05 PM  Result Value Ref Range   Lipase 22 11 - 51 U/L  Wet prep, genital     Status: Abnormal   Collection Time: 11/17/20  7:22 PM  Result Value Ref Range   Yeast Wet Prep HPF POC NONE SEEN NONE SEEN   Trich, Wet Prep NONE SEEN NONE SEEN   Clue Cells Wet Prep HPF POC NONE SEEN NONE SEEN   WBC, Wet Prep HPF POC FEW (A) NONE SEEN   Sperm NONE SEEN    No results found.  MAU Course  Procedures  MDM -suspect RLP, r/o PTL -UA: hazy/80 ketones, sending urine for culture based on symptoms -CE: difficult to assess d/t body habitus -WetPrep: WNL -GC/CT collected -FHT 155   -N/V x2 today with epigastric pain -CBC w/ Diff: no abnormalities requiring  treatment -CMP: AST/ALT 43/58 -Amylase: 64 (WNL) -Lipase: 22 (WNL) -care transferred to K. Ardean Larsen, CNM; RUQ [place], Protonix/Zofran pending Nugent, Gerrie Nordmann, NP  8:54 PM 11/17/2020   Orders Placed This Encounter  Procedures  . Wet prep, genital    Standing Status:   Standing    Number of Occurrences:   1  . Culture, OB Urine    Standing Status:   Standing    Number of Occurrences:   1  . US Abdomen Limited RUQ (LIVER/GB)    Standing Status:   Standing    Number of Occurrences:   1    Order Specific Question:   Symptom/Reason for Exam    Answer:   Epigastric pain [[id number]]  . Urinalysis, Routine w reflex microscopic Urine, Clean Catch    Standing Status:   Standing    Number of Occurrences:   1  . CBC with Differential/Platelet    Standing Status:   Standing    Number of Occurrences:   1  . Comprehensive metabolic panel    Standing Status:   Standing    Number of Occurrences:   1  . Amylase    Standing Status:   Standing    Number of Occurrences:   1  . Lipase, blood    Standing Status:  Standing    Number of Occurrences:   1  . Protein / creatinine ratio, urine    Standing Status:   Standing    Number of Occurrences:   1  . Insert peripheral IV    Standing Status:   Standing    Number of Occurrences:   1   Meds ordered this encounter  Medications  . pantoprazole (PROTONIX) injection 40 mg  . ondansetron (ZOFRAN) 8 mg in sodium chloride 0.9 % 50 mL IVPB    Assessment and Plan   1. Lower abdominal pain   2. Epigastric pain   -Patient's RUQ [place] is normal, patient back in room and reports that her pain is resolved and she has no nausea.  -Discussed patients case/labs/exam/BPs with Dr. Roselie Awkward, who agrees safe for discharge.  -BP slightly elevated at discharge; Patient has appointment this week in office and can be rechecked again.  -Rx home with Zofran; GC CT Pending  -All questions answered, patient stable for discharge.   Maye Hides

## 2020-11-18 ENCOUNTER — Other Ambulatory Visit: Payer: Self-pay | Admitting: Student

## 2020-11-18 LAB — PROTEIN / CREATININE RATIO, URINE
Creatinine, Urine: 163.86 mg/dL
Protein Creatinine Ratio: 0.09 mg/mg{Cre} (ref 0.00–0.15)
Total Protein, Urine: 14 mg/dL

## 2020-11-18 LAB — RAPID URINE DRUG SCREEN, HOSP PERFORMED
Amphetamines: NOT DETECTED
Barbiturates: NOT DETECTED
Benzodiazepines: NOT DETECTED
Cocaine: NOT DETECTED
Opiates: NOT DETECTED
Tetrahydrocannabinol: POSITIVE — AB

## 2020-11-18 MED ORDER — ONDANSETRON 8 MG PO TBDP: 8.0000 mg | ORAL_TABLET | Freq: Three times a day (TID) | ORAL | 1 refills | Status: DC | PRN

## 2020-11-20 ENCOUNTER — Encounter: Payer: Self-pay | Admitting: Student

## 2020-11-20 ENCOUNTER — Other Ambulatory Visit: Payer: Medicaid Other

## 2020-11-20 ENCOUNTER — Other Ambulatory Visit: Payer: Self-pay

## 2020-11-20 DIAGNOSIS — O99322 Drug use complicating pregnancy, second trimester: Secondary | ICD-10-CM | POA: Insufficient documentation

## 2020-11-20 LAB — CULTURE, OB URINE: Culture: 50000 — AB

## 2020-11-20 LAB — GC/CHLAMYDIA PROBE AMP (~~LOC~~) NOT AT ARMC
Chlamydia: NEGATIVE
Comment: NEGATIVE
Comment: NORMAL
Neisseria Gonorrhea: NEGATIVE

## 2020-11-21 ENCOUNTER — Telehealth: Payer: Self-pay

## 2020-11-21 DIAGNOSIS — O2342 Unspecified infection of urinary tract in pregnancy, second trimester: Secondary | ICD-10-CM

## 2020-11-21 MED ORDER — CEFADROXIL 500 MG PO CAPS
500.0000 mg | ORAL_CAPSULE | Freq: Two times a day (BID) | ORAL | 0 refills | Status: DC
Start: 2020-11-21 — End: 2021-01-03

## 2020-11-21 NOTE — Telephone Encounter (Signed)
Brandy Brock  10-19-1996 [id number]  Attempted to contact patient regarding UTI results.  No answer and nondescript message left for return to call to MAU.  Rx for Duricef sent to pharmacy on file.   Maryann Conners MSN, CNM Advanced Practice Provider, Center for [employer] [facility]

## 2020-11-21 NOTE — Telephone Encounter (Signed)
Brandy Brock  14-Jun-1996 [id number]   Patient  Returns call and verified her identity via birth date and last 60 of her SSN.  Patient agreeable to results via phone and was informed of UTI and treatment.  Patient questions ways to prevent in future and informed of increased water intake, frequent bladder emptying, and proper wiping.  Discussed retesting for cure in 3-4 weeks at prenatal visit.  Patient verbalized understanding and without further questions or concerns.  Informed that rx sent to pharmacy on file and should be taken twice daily x 7 days.    Maryann Conners MSN, CNM Advanced Practice Provider, Center for [facility]  **This visit was completed, in its entirety, via telehealth communications.  I personally spent >/=2 minutes on the phone providing recommendations, education, and guidance.**

## 2020-11-27 ENCOUNTER — Ambulatory Visit: Payer: Medicaid Other

## 2020-12-06 ENCOUNTER — Encounter: Payer: Self-pay | Admitting: *Deleted

## 2020-12-06 ENCOUNTER — Ambulatory Visit: Payer: Medicaid Other | Attending: Nurse Practitioner | Admitting: *Deleted

## 2020-12-06 ENCOUNTER — Ambulatory Visit: Payer: Medicaid Other

## 2020-12-06 ENCOUNTER — Ambulatory Visit (HOSPITAL_BASED_OUTPATIENT_CLINIC_OR_DEPARTMENT_OTHER): Payer: Medicaid Other

## 2020-12-06 ENCOUNTER — Other Ambulatory Visit: Payer: Self-pay

## 2020-12-06 ENCOUNTER — Encounter: Payer: Self-pay | Admitting: Nurse Practitioner

## 2020-12-06 ENCOUNTER — Other Ambulatory Visit: Payer: Self-pay | Admitting: *Deleted

## 2020-12-06 DIAGNOSIS — O0990 Supervision of high risk pregnancy, unspecified, unspecified trimester: Secondary | ICD-10-CM

## 2020-12-06 DIAGNOSIS — O4442 Low lying placenta NOS or without hemorrhage, second trimester: Secondary | ICD-10-CM | POA: Insufficient documentation

## 2020-12-06 DIAGNOSIS — O0992 Supervision of high risk pregnancy, unspecified, second trimester: Secondary | ICD-10-CM | POA: Diagnosis not present

## 2020-12-06 DIAGNOSIS — O10012 Pre-existing essential hypertension complicating pregnancy, second trimester: Secondary | ICD-10-CM | POA: Insufficient documentation

## 2020-12-06 DIAGNOSIS — O99210 Obesity complicating pregnancy, unspecified trimester: Secondary | ICD-10-CM

## 2020-12-06 DIAGNOSIS — O99212 Obesity complicating pregnancy, second trimester: Secondary | ICD-10-CM | POA: Insufficient documentation

## 2020-12-06 DIAGNOSIS — Z36 Encounter for antenatal screening for chromosomal anomalies: Secondary | ICD-10-CM

## 2020-12-06 DIAGNOSIS — R638 Other symptoms and signs concerning food and fluid intake: Secondary | ICD-10-CM

## 2020-12-06 DIAGNOSIS — O358XX0 Maternal care for other (suspected) fetal abnormality and damage, not applicable or unspecified: Secondary | ICD-10-CM | POA: Insufficient documentation

## 2020-12-06 DIAGNOSIS — O283 Abnormal ultrasonic finding on antenatal screening of mother: Secondary | ICD-10-CM

## 2020-12-06 DIAGNOSIS — Z3A22 22 weeks gestation of pregnancy: Secondary | ICD-10-CM | POA: Insufficient documentation

## 2020-12-06 DIAGNOSIS — O99512 Diseases of the respiratory system complicating pregnancy, second trimester: Secondary | ICD-10-CM | POA: Diagnosis not present

## 2020-12-06 DIAGNOSIS — J45909 Unspecified asthma, uncomplicated: Secondary | ICD-10-CM | POA: Insufficient documentation

## 2020-12-06 DIAGNOSIS — O10919 Unspecified pre-existing hypertension complicating pregnancy, unspecified trimester: Secondary | ICD-10-CM

## 2020-12-06 DIAGNOSIS — R87612 Low grade squamous intraepithelial lesion on cytologic smear of cervix (LGSIL): Secondary | ICD-10-CM

## 2020-12-11 ENCOUNTER — Encounter: Payer: Self-pay | Admitting: Certified Nurse Midwife

## 2020-12-11 ENCOUNTER — Other Ambulatory Visit: Payer: Self-pay

## 2020-12-11 ENCOUNTER — Telehealth (INDEPENDENT_AMBULATORY_CARE_PROVIDER_SITE_OTHER): Payer: Medicaid Other | Admitting: Certified Nurse Midwife

## 2020-12-11 DIAGNOSIS — O0990 Supervision of high risk pregnancy, unspecified, unspecified trimester: Secondary | ICD-10-CM

## 2020-12-11 DIAGNOSIS — R7989 Other specified abnormal findings of blood chemistry: Secondary | ICD-10-CM

## 2020-12-11 DIAGNOSIS — O98119 Syphilis complicating pregnancy, unspecified trimester: Secondary | ICD-10-CM

## 2020-12-11 DIAGNOSIS — O99210 Obesity complicating pregnancy, unspecified trimester: Secondary | ICD-10-CM

## 2020-12-11 DIAGNOSIS — O10919 Unspecified pre-existing hypertension complicating pregnancy, unspecified trimester: Secondary | ICD-10-CM

## 2020-12-11 DIAGNOSIS — R87612 Low grade squamous intraepithelial lesion on cytologic smear of cervix (LGSIL): Secondary | ICD-10-CM

## 2020-12-11 DIAGNOSIS — O4442 Low lying placenta NOS or without hemorrhage, second trimester: Secondary | ICD-10-CM

## 2020-12-11 HISTORY — DX: Other specified abnormal findings of blood chemistry: R79.89

## 2020-12-11 NOTE — Progress Notes (Signed)
36 Tiana not connected virtually. I called her and left a message I am calling for your virtual visit and will call again in a few minutes- please answer. Tarra Pence,RN  [phone] Tiana not connected virtually. I called and she answered and we discussed she has virtual visit. She states she would like to reschedule because she is getting a vaccine. Will put message for  Registrar in checkout. Zalman Hull,RN

## 2020-12-11 NOTE — Patient Instructions (Addendum)

## 2020-12-11 NOTE — Progress Notes (Signed)
Patient unable to do mychart appointment today and would like to reschedule.   Lajean Manes, CNM 12/11/20, 8:36 AM

## 2020-12-12 ENCOUNTER — Encounter: Payer: Medicaid Other | Admitting: Family Medicine

## 2020-12-18 ENCOUNTER — Telehealth: Payer: Self-pay | Admitting: Genetic Counselor

## 2020-12-18 NOTE — Telephone Encounter (Signed)
I called Brandy Brock to determine if she had her blood drawn for MaterniT21 noninvasive prenatal screening (NIPS) following her ultrasound on 12/06/20, as we had not yet received her results. Brandy Brock informed me that the phlebotomist attempted to draw her blood but was unsuccessful. She is still interested in having a sample drawn for NIPS at her next ultrasound visit on 01/03/21. However, she requested that another person draw her blood. We will attempt to have one of our nurses draw her blood at her next visit.  Buelah Manis, MS, [facility] Genetic Counselor

## 2020-12-20 ENCOUNTER — Telehealth: Payer: Self-pay | Admitting: *Deleted

## 2020-12-20 NOTE — Telephone Encounter (Signed)
Pt left VM message stating that she is having pain in her pubic bone area which is abnormal for her. The pain is making it difficult for her to walk. She wants to know what she can do for the pain.

## 2020-12-20 NOTE — Telephone Encounter (Signed)
Spoke with pt. Pt states having pelvic/pubic bone pain x 1 week. Pt denies any vaginal bleeding, cramping or severe pain. +FM.  Pt advised normal with growing baby and uterus. Pt advised can take OTC tylenol. Pt advised to be seen at MAU if has vaginal bleeding like a period or any severe abd pain/cramps not going away with Tylenol OTC. Pt agreeable and verbalized understanding.   Pt also states unaware of appt tomorrow that was scheduled for 2/10 at 215 pm. Pt states will not be able to make appt tomorrow and needs AM appt only. Pt advised will send message to front office to cancel for 2/10 and to reschedule with any provider for AM. Pt verbalized understanding.   Staff message sent to front office.  Encounter closed

## 2020-12-21 ENCOUNTER — Encounter: Payer: Medicaid Other | Admitting: Obstetrics & Gynecology

## 2020-12-22 ENCOUNTER — Encounter: Payer: Medicaid Other | Admitting: Obstetrics and Gynecology

## 2021-01-01 ENCOUNTER — Encounter: Payer: Medicaid Other | Admitting: Obstetrics & Gynecology

## 2021-01-03 ENCOUNTER — Ambulatory Visit: Payer: Medicaid Other | Admitting: *Deleted

## 2021-01-03 ENCOUNTER — Other Ambulatory Visit: Payer: Self-pay | Admitting: Obstetrics and Gynecology

## 2021-01-03 ENCOUNTER — Other Ambulatory Visit: Payer: Self-pay | Admitting: *Deleted

## 2021-01-03 ENCOUNTER — Ambulatory Visit (INDEPENDENT_AMBULATORY_CARE_PROVIDER_SITE_OTHER): Payer: Medicaid Other | Admitting: Obstetrics and Gynecology

## 2021-01-03 ENCOUNTER — Ambulatory Visit: Payer: Medicaid Other | Attending: Obstetrics and Gynecology

## 2021-01-03 ENCOUNTER — Other Ambulatory Visit: Payer: Self-pay

## 2021-01-03 ENCOUNTER — Encounter: Payer: Self-pay | Admitting: *Deleted

## 2021-01-03 VITALS — BP 151/83 | HR 79 | Wt 398.2 lb

## 2021-01-03 DIAGNOSIS — R7989 Other specified abnormal findings of blood chemistry: Secondary | ICD-10-CM

## 2021-01-03 DIAGNOSIS — R87612 Low grade squamous intraepithelial lesion on cytologic smear of cervix (LGSIL): Secondary | ICD-10-CM

## 2021-01-03 DIAGNOSIS — O10919 Unspecified pre-existing hypertension complicating pregnancy, unspecified trimester: Secondary | ICD-10-CM | POA: Insufficient documentation

## 2021-01-03 DIAGNOSIS — O99210 Obesity complicating pregnancy, unspecified trimester: Secondary | ICD-10-CM | POA: Diagnosis present

## 2021-01-03 DIAGNOSIS — O0990 Supervision of high risk pregnancy, unspecified, unspecified trimester: Secondary | ICD-10-CM | POA: Diagnosis present

## 2021-01-03 DIAGNOSIS — O358XX0 Maternal care for other (suspected) fetal abnormality and damage, not applicable or unspecified: Secondary | ICD-10-CM | POA: Diagnosis not present

## 2021-01-03 DIAGNOSIS — E669 Obesity, unspecified: Secondary | ICD-10-CM

## 2021-01-03 DIAGNOSIS — Z3A26 26 weeks gestation of pregnancy: Secondary | ICD-10-CM

## 2021-01-03 DIAGNOSIS — R638 Other symptoms and signs concerning food and fluid intake: Secondary | ICD-10-CM

## 2021-01-03 DIAGNOSIS — O98119 Syphilis complicating pregnancy, unspecified trimester: Secondary | ICD-10-CM

## 2021-01-03 DIAGNOSIS — Z6841 Body Mass Index (BMI) 40.0 and over, adult: Secondary | ICD-10-CM

## 2021-01-03 DIAGNOSIS — O10013 Pre-existing essential hypertension complicating pregnancy, third trimester: Secondary | ICD-10-CM

## 2021-01-03 DIAGNOSIS — Z363 Encounter for antenatal screening for malformations: Secondary | ICD-10-CM | POA: Diagnosis not present

## 2021-01-03 DIAGNOSIS — O99511 Diseases of the respiratory system complicating pregnancy, first trimester: Secondary | ICD-10-CM

## 2021-01-03 DIAGNOSIS — J45909 Unspecified asthma, uncomplicated: Secondary | ICD-10-CM

## 2021-01-03 DIAGNOSIS — O99212 Obesity complicating pregnancy, second trimester: Secondary | ICD-10-CM

## 2021-01-03 DIAGNOSIS — O4442 Low lying placenta NOS or without hemorrhage, second trimester: Secondary | ICD-10-CM

## 2021-01-03 MED ORDER — NIFEDIPINE ER OSMOTIC RELEASE 30 MG PO TB24: 30.0000 mg | ORAL_TABLET | Freq: Every day | ORAL | 1 refills | Status: DC

## 2021-01-03 NOTE — Progress Notes (Signed)
Prenatal Visit Note Date: 01/03/2021 Clinic: [facility]  Subjective:  Brandy Brock is a 25 y.o. G1P0000 at [redacted]w[redacted]d being seen today for ongoing prenatal care.  She is currently monitored for the following issues for this high-risk pregnancy and has BMI 60.0-69.9, adult ([place]); Asthma; Supervision of high risk pregnancy, antepartum; Syphilis affecting pregnancy; Obesity in pregnancy, antepartum; Chronic hypertension affecting pregnancy; LGSIL on Pap smear of cervix; Substance abuse affecting pregnancy in second trimester, antepartum; Low lying placenta nos or without hemorrhage, second trimester; and Elevated liver function tests on their problem list.  Patient reports no complaints.   Contractions: Not present. Vag. Bleeding: None.  Movement: Present. Denies leaking of fluid.   The following portions of the patient's history were reviewed and updated as appropriate: allergies, current medications, past family history, past medical history, past social history, past surgical history and problem list. Problem list updated.  Objective:   Vitals:   01/03/21 1032 01/03/21 1036  BP: (!) 159/87 (!) 151/83  Pulse: 81 79  Weight: (!) 398 lb 3.2 oz (180.6 kg)     Fetal Status: Fetal Heart Rate (bpm): 141   Movement: Present     General:  Alert, oriented and cooperative. Patient is in no acute distress.  Skin: Skin is warm and dry. No rash noted.   Cardiovascular: Normal heart rate noted  Respiratory: Normal respiratory effort, no problems with respiration noted  Abdomen: Soft, gravid, appropriate for gestational age. Pain/Pressure: Present     Pelvic:  Cervical exam deferred        Extremities: Normal range of motion.  Edema: None  Mental Status: Normal mood and affect. Normal behavior. Normal judgment and thought content.   Urinalysis:      Assessment and Plan:  Pregnancy: G1P0000 at [redacted]w[redacted]d  1. Elevated liver function tests See below CMP Latest Ref Rng & Units  11/17/2020 04/20/2019 10/13/2008  Glucose 70 - 99 mg/dL 80 85 108(H)  BUN 6 - 20 mg/dL <5(L) 6 20  Creatinine 0.44 - 1.00 mg/dL 0.58 0.66 0.83  Sodium 135 - 145 mmol/L 135 137 138  Potassium 3.5 - 5.1 mmol/L 3.7 4.7 4.4  Chloride 98 - 111 mmol/L 103 104 104  CO2 22 - 32 mmol/L 23 24 20   Calcium 8.9 - 10.3 mg/dL 9.4 9.2 9.3  Total Protein 6.5 - 8.1 g/dL 6.9 8.1 -  Total Bilirubin 0.3 - 1.2 mg/dL 0.7 1.1 -  Alkaline Phos 38 - 126 U/L 39 59 -  AST 15 - 41 U/L 43(H) 27 -  ALT 0 - 44 U/L 58(H) 16 -     2. Low lying placenta nos or without hemorrhage, second trimester U/s done today. Results not in yet. F/u final read  3. Obesity in pregnancy, antepartum Weight stable  4. BMI 60.0-69.9, adult ([place])  5. Syphilis affecting pregnancy, antepartum F/u rpr nv S/p tx at [facility] this pregnancy; see PL  6. Supervision of high risk pregnancy, antepartum 28wk labs nv  7. Chronic hypertension affecting pregnancy No s/s of pre-eclampsia. Precautions given. Recommend starting meds which she is amenable to. Procardia xl 30 qday sent in. Continue low dose asa - Comprehensive metabolic panel - CBC - Protein / creatinine ratio, urine  8. LGSIL on Pap smear of cervix Repeat pap PP  Preterm labor symptoms and general obstetric precautions including but not limited to vaginal bleeding, contractions, leaking of fluid and fetal movement were reviewed in detail with the patient. Please refer to After Visit Summary for  other counseling recommendations.  Return in about 1 week (around 01/10/2021) for  in person, 2h GTT, MD visit.   Aletha Halim, MD

## 2021-01-04 LAB — COMPREHENSIVE METABOLIC PANEL
ALT: 18 IU/L (ref 0–32)
AST: 16 IU/L (ref 0–40)
Albumin/Globulin Ratio: 1.2 (ref 1.2–2.2)
Albumin: 3.8 g/dL — ABNORMAL LOW (ref 3.9–5.0)
Alkaline Phosphatase: 58 IU/L (ref 44–121)
BUN/Creatinine Ratio: 11 (ref 9–23)
BUN: 6 mg/dL (ref 6–20)
Bilirubin Total: 0.3 mg/dL (ref 0.0–1.2)
CO2: 18 mmol/L — ABNORMAL LOW (ref 20–29)
Calcium: 9.8 mg/dL (ref 8.7–10.2)
Chloride: 102 mmol/L (ref 96–106)
Creatinine, Ser: 0.53 mg/dL — ABNORMAL LOW (ref 0.57–1.00)
GFR calc Af Amer: 154 mL/min/{1.73_m2} (ref >59)
GFR calc non Af Amer: 133 mL/min/{1.73_m2} (ref >59)
Globulin, Total: 3.2 g/dL (ref 1.5–4.5)
Glucose: 72 mg/dL (ref 65–99)
Potassium: 4.3 mmol/L (ref 3.5–5.2)
Sodium: 136 mmol/L (ref 134–144)
Total Protein: 7 g/dL (ref 6.0–8.5)

## 2021-01-04 LAB — CBC
Hematocrit: 33.7 % — ABNORMAL LOW (ref 34.0–46.6)
Hemoglobin: 11.5 g/dL (ref 11.1–15.9)
MCH: 29.3 pg (ref 26.6–33.0)
MCHC: 34.1 g/dL (ref 31.5–35.7)
MCV: 86 fL (ref 79–97)
Platelets: 199 10*3/uL (ref 150–450)
RBC: 3.92 x10E6/uL (ref 3.77–5.28)
RDW: 12.3 % (ref 11.7–15.4)
WBC: 13.3 10*3/uL — ABNORMAL HIGH (ref 3.4–10.8)

## 2021-01-04 LAB — PROTEIN / CREATININE RATIO, URINE
Creatinine, Urine: 100.1 mg/dL
Protein, Ur: 9.3 mg/dL
Protein/Creat Ratio: 93 mg/g creat (ref 0–200)

## 2021-01-09 ENCOUNTER — Other Ambulatory Visit: Payer: Self-pay | Admitting: *Deleted

## 2021-01-09 DIAGNOSIS — O0990 Supervision of high risk pregnancy, unspecified, unspecified trimester: Secondary | ICD-10-CM

## 2021-01-11 ENCOUNTER — Ambulatory Visit (INDEPENDENT_AMBULATORY_CARE_PROVIDER_SITE_OTHER): Payer: Medicaid Other | Admitting: Advanced Practice Midwife

## 2021-01-11 ENCOUNTER — Other Ambulatory Visit: Payer: Self-pay

## 2021-01-11 ENCOUNTER — Other Ambulatory Visit: Payer: Medicaid Other

## 2021-01-11 VITALS — BP 118/78 | HR 98 | Wt 390.0 lb

## 2021-01-11 DIAGNOSIS — O4442 Low lying placenta NOS or without hemorrhage, second trimester: Secondary | ICD-10-CM

## 2021-01-11 DIAGNOSIS — O10919 Unspecified pre-existing hypertension complicating pregnancy, unspecified trimester: Secondary | ICD-10-CM

## 2021-01-11 DIAGNOSIS — Z6841 Body Mass Index (BMI) 40.0 and over, adult: Secondary | ICD-10-CM

## 2021-01-11 DIAGNOSIS — O0990 Supervision of high risk pregnancy, unspecified, unspecified trimester: Secondary | ICD-10-CM

## 2021-01-11 DIAGNOSIS — Z3A28 28 weeks gestation of pregnancy: Secondary | ICD-10-CM

## 2021-01-11 NOTE — Progress Notes (Signed)
   PRENATAL VISIT NOTE  Subjective:  Brandy Brock is a 25 y.o. G1P0000 at [redacted]w[redacted]d being seen today for ongoing prenatal care.  She is currently monitored for the following issues for this high-risk pregnancy and has BMI 60.0-69.9, adult ([place]); Asthma; Supervision of high risk pregnancy, antepartum; Syphilis affecting pregnancy; Obesity in pregnancy, antepartum; Chronic hypertension affecting pregnancy; LGSIL on Pap smear of cervix; Substance abuse affecting pregnancy in second trimester, antepartum; Low lying placenta nos or without hemorrhage, second trimester; and Elevated liver function tests on their problem list.  Patient reports no complaints.  Contractions: Not present. Vag. Bleeding: None.  Movement: Present. Denies leaking of fluid.   The following portions of the patient's history were reviewed and updated as appropriate: allergies, current medications, past family history, past medical history, past social history, past surgical history and problem list. Problem list updated.  Objective:   Vitals:   01/11/21 0934  BP: 118/78  Pulse: 98  Weight: (!) 390 lb (176.9 kg)    Fetal Status: Fetal Heart Rate (bpm): 138   Movement: Present     General:  Alert, oriented and cooperative. Patient is in no acute distress.  Skin: Skin is warm and dry. No rash noted.   Cardiovascular: Normal heart rate noted  Respiratory: Normal respiratory effort, no problems with respiration noted  Abdomen: Soft, gravid, appropriate for gestational age.  Pain/Pressure: Present     Pelvic: Cervical exam deferred        Extremities: Normal range of motion.  Edema: Trace  Mental Status: Normal mood and affect. Normal behavior. Normal judgment and thought content.   Assessment and Plan:  Pregnancy: G1P0000 at [redacted]w[redacted]d  1. Supervision of high risk pregnancy, antepartum - No acute concerns - Has doula  2. Chronic hypertension affecting pregnancy - Stable on Procardia 30 XL - Not a candidate for  waterbirth but reviewed options in labor to preserve intimacy, patient feeling in control and relaxed including possible hydrotherapy, CNM care  3. BMI 60.0-69.9, adult ([place])   4. Low lying placenta nos or without hemorrhage, second trimester - RESOLVED!!!!!!!! as of MFM notes 01/03/2021  5. [redacted] weeks gestation of pregnancy   Preterm labor symptoms and general obstetric precautions including but not limited to vaginal bleeding, contractions, leaking of fluid and fetal movement were reviewed in detail with the patient. Please refer to After Visit Summary for other counseling recommendations.  Return in about 2 weeks (around 01/25/2021).  Future Appointments  Date Time Provider [place]  01/29/2021  8:55 AM Virginia Rochester, NP [facility]  01/31/2021  8:00 AM [facility] NURSE [facility]  01/31/2021  8:15 AM [facility] US2 [facility] [place]    Darlina Rumpf, CNM

## 2021-01-11 NOTE — Patient Instructions (Addendum)
[email or web address] for contraception information   AREA PEDIATRIC/FAMILY [place] [phone]) . [facility] Davy Pique, MD; Gwendlyn Deutscher, MD; Walker Kehr, MD; Andria Frames, MD; McDiarmid, MD; Dutch Quint, MD; Nori Riis, MD; Mingo Amber, [address] [place], [place] [address] o [phone] o Mon-Fri 8:30-12:30, 1:30-5:00 o Providers come to see babies at [facility] o Accepting Medicaid . [place] at [facility] [place] [facility] providers who accept newborns: Dorthy Cooler, MD; Orland Mustard, MD; Stephanie Acre, MD o [place], [place], [place] [address] o [phone] o Mon-Fri 8:00-5:30 o Babies seen by providers at [facility] o Does NOT accept Medicaid o Please call early in hospitalization for appointment (limited availability)  . Mustard [place], MD o [address] [place], [place] [address] o [phone] o Mon, Tue, Thur, Fri 8:30-5:00, Wed 10:00-7:00 (closed 1-2pm) o Babies seen by [facility] providers o Accepting Medicaid . [place], MD o [place], [place], [place] [address] o [phone] o Mon-Fri 8:30-5:00, Sat 8:30-12:00 o Provider comes to see babies at [place] Medicaid o Must have been referred from current patients or contacted office prior to delivery . [facility] ([place] [facility] [place]) Franne Forts, MD; Tamera Punt, MD; Doneen Poisson, MD; Fatima Sanger, MD; Wynetta Emery, MD; Jess Barters, MD; Tami Ribas, MD; Herbert Moors, MD; Derrell Lolling, MD; Dorothyann Peng, MD; Lucious Groves, NP; Baldo Ash, NP o [address], [place], [place] [address] o [phone] o Mon, Tue, Thur, Fri 8:30-5:30, Wed 9:30-5:30, Sat 8:30-12:30 o Babies seen by [facility] providers o Accepting Medicaid o Only accepting infants of first-time parents or siblings of current patients [facility] discharge coordinator will make follow-up  appointment . Baltazar Najjar o [place] [address], [place], [place]  [address] o [phone]   Fax - [phone] . [facility] o [address], [place], [place]  [address] o Phone - [phone]   Fax [phone] . [place], [place], [place], [place]  [address] o [phone]  [place] [phone]) . [facility] Reginal Lutes, MD; Jacklynn Ganong, MD; Torrie Mayers, MD; MD; Rosana Hoes, MD; Servando [place]ina, MD; Rose Fillers, MD; Rex Kras, MD; Corinna Capra, MD; Volney American, MD; Trilby Drummer, MD; Janann Colonel, MD; Jimmye Norman, [place], [place], [place] [address] o [phone] o Mon-Fri 8:30-5:00 (extended evenings Mon-Thur as needed), Sat-Sun 10:00-1:00 o Providers come to see babies at [place] Medicaid for families of first-time babies and families with all children in the household age 65 and under. Must register with office prior to making appointment (M-F only). . [place], NP; Tomi Bamberger, MD; Redmond School, MD; [place], [place]., [place], [place] [address] o [phone] o Mon-Fri 8:00-5:00 o Babies seen by providers at [facility] o Does NOT accept Medicaid/Commercial Insurance Only . [facility] at [place] ([facility])  Marnee Guarneri, MD; Drema Dallas, MD; Montine Circle, MD; Vilma Prader, MD; Vanita Panda, MD; Alfonso Ramus, MD; Ruthann Cancer, MD; Roxanne Mins, MD; Rosalva Ferron, MD; Polly Cobia, MD o [address] [place], [place] [address] o [phone] o Mon-Fri 8:30-5:30, Sat (Oct.-Mar.) 9:00-1:00 o Babies seen by providers at [place] [phone]) . [facility] Peers, MD; Suzan Slick, MD o [address], [place],  [address] o [phone] o Mon-Fri 8:30-5:00, Sat 8:30-12:00 o Providers come to see babies at [facility] o Does NOT accept  Medicaid . [place] at [place], [place]; [place], MD; [place], [place]; Nancy Fetter, MD;  Moreen Fowler, [place], [place], [place] [address] o [phone] o Mon-Fri 8:00-5:00 o Babies seen by providers at [facility] o Does NOT accept Medicaid o Only accepting babies of parents who are patients o Please call early in hospitalization for appointment (limited availability) . [facility] Blanca Friend, MD; Sharlene Motts, MD; Rod Can, MD; Warner Mccreedy, NP; Sabra Heck, MD; Ermalinda Memos, MD; Sharlett Iles, NP; Aurther Loft, MD; Jerrye Beavers, MD; Marcello Moores, MD; Berline Lopes, MD; Charolette Forward, MD o [place]. [place], [place], [place] [address] o [phone] o Mon-Fri 8:00-5:00, Sat 9:00-12:00 o Providers come to see babies at [facility] o Does NOT accept [facility] [phone]) . [place] [facility] [place] providers accepting new patients: Dayna Ramus, NP; [place], [place], [place], [place] [address] o [phone] o Mon-Fri 8:00-5:00 o Babies seen by providers at [facility] o Does NOT accept Medicaid o Only accepting babies of parents who are patients o Please call early in hospitalization for appointment (limited availability) . [facility] Oswaldo Conroy, MD; Sheran Lawless, MD o [address] [place], [place] [address] o [phone] (press 1 to schedule appointment) o Mon-Fri 8:00-5:00 o Providers come to see babies at [facility] o Does NOT accept Medicaid . [facility] Jodi Mourning, MD o [address], [place], [place] [address] o [phone] o Mon-Fri 8:30-5:00 (lunch 12:30-1:00), extended hours by appointment only Wed 5:00-6:30 o Babies seen by [facility] providers o Accepting Medicaid . [place] at Evalyn Casco, MD; [place], MD; Ethlyn Gallery, MD o [place], [place], [place] [address] o [phone] o Mon-Fri 8:00-5:00 o Babies seen by [facility] providers o Does NOT accept Medicaid . Therapist, music at [place], MD; Yong Channel, MD; [place], [place]., [place], [place] [address] o [phone] o Mon-Fri 8:00-5:00 o Babies seen by [facility] providers o Does NOT accept Medicaid . [place], [place]; [place], [place]; [place], NP; Albertina Parr, MD; Frederic Jericho, MD; Ronney Lion, MD; Carlos Levering, NP; Jerelene Redden, NP; Tomasita Crumble, NP; Ronelle Nigh, NP; Corinna Lines, MD; [place], MD o [address] [place], [place] [address] o [phone] o Mon-Fri 8:30-5:00, Sat 10:00-1:00 o Providers come to see babies at [facility] o Does NOT accept Medicaid o Free prenatal information session Tuesdays at 4:45pm . [facility] Porfirio Oar, MD; [place], [place]; [place], [place]; [place], [place] [address]., [place] [address] o [phone] o Mon-Fri 7:30-5:30 o Babies seen by [facility] providers . [facility] o [address], [place], [place], [place]  [address] o [phone]   Fax - [phone]  [place] [phone] & [phone]) . [place] o [address] [place], [place] [address] o [phone] o Mon-Thur 8:00-6:00 o Providers come to see babies at [place] Medicaid . [place], NP; Melford Aase, MD; [place], [place]; [place], [place]., [place], [place] [address] o [phone] o Mon-Thur 7:30-7:30, Fri 7:30-4:30 o Babies seen by [facility] providers o Accepting Medicaid . [facility] Nyra Jabs, MD; Cristino Martes, NP; Gertie Baron, MD o [place] [address], [place], [place] [address] o [phone] o Mon-Fri 8:30-5:00, Sat 8:30-12:00 o Providers come to see babies at [place] Medicaid o Must have "Meet & Greet" appointment at office prior to delivery . [place] (  [facility]) Jodene Nam, MD; Juleen China, MD; Clydene Laming, [place], [place], [place] [address] o [phone] o Mon-Wed 8:00-6:00, Thur-Fri 8:00-5:00, Sat  9:00-12:00 o Providers come to see babies at [facility] o Does NOT accept Medicaid o Only accepting siblings of current patients . [facility] [place], [place], [place], [place]  [address] o [phone]   Fax [phone] . [place] at [place] [address] [place], [place]  [address] o [phone]   Fax - [place] [phone] & [phone]) . Therapist, music at [place], DO; [place], [address]., [place], [place] [address] o [phone] o Mon-Fri 7:00-5:00 o Babies seen by [facility] providers o Does NOT accept Medicaid . [place], MD; [place], [place]; [place], [place], [place], [place] [address] o [phone] o Mon-Fri 8:00-5:00 o Babies seen by [facility] providers o Accepting Medicaid . [place], MD; [place], [place]; [place], NP; [place], [place], [place], [place] [address] o [phone] o Mon-Fri 8:00-5:00 o Babies seen by providers at [place] [phone]) . [facility] [place], [place] o [place]., [place], [place] [address] o [phone] o Mon-Fri 8:00-5:00 o Babies seen by [facility] providers o Does NOT accept Medicaid o Limited availability, please call early in hospitalization to schedule follow-up . [facility] Leilani Merl, PA; Maisie Fus, MD; [place], MD; [place], [place]; Jeannine Kitten, MD; [place], [place] [facility] [address], [place], [place] [address] o [phone] o Mon-Fri 8:30-5:00, Sat 9:00-12:00 o Babies seen by providers at [facility] o Accepting Medicaid o Please register online then schedule online or call office o [email or web address] . [place]  ([place] [facility] [place]) Kristian Covey, NP; Dwyane Dee, MD; Leonidas Romberg, PA o [address] [place], [place], [place] [address] o [phone] o Mon-Fri 8:00-5:00 o Babies seen by providers at [facility] o Accepting Medicaid . [place] ([facility]) Dairl Ponder, MD; Rayvon Char, NP; Melina Modena, MD o [address] [place], [place], [place] [address] o [phone] o Mon-Fri 8:00-5:30, Sat&Sun by appointment (phones open at 8:30) o Babies seen by [facility] providers o Accepting Medicaid o Must be a first-time baby or sibling of current patient . [place], [address], [place], [place]  [address] o [phone]   Fax - [phone]  [place] [phone] & [phone]) . [place], [place]; [place], [place]; Benjamine Mola, MD; [place], [place]; Harrell Lark, MD o [address] [place], [place] [address] o [phone] o Mon-Thur 8:00-7:00, Fri 8:00-5:00, Sat 8:00-12:00, Sun 9:00-12:00 o Babies seen by [facility] providers o Accepting Medicaid . [facility] at [facility], MD; Ruthann Cancer, MD; [facility], MD o [address], [place], [place] [address] o [phone] o Mon-Thur 8:00-5:00 o Babies seen by providers at [facility] o Accepting Medicaid . [facility] at [place], MD; Co[place], MD; Amedeo Plenty, MD; Bobby Rumpf, MD; List, MD; Lavonia Drafts, MD; Ruthann Cancer, MD; Selinda Eon, MD; Audie Box, MD; Jim Like, MD; Christie Nottingham, MD; Hubbard Hartshorn, MD; [place]ena Nunnery, MD o [address], [place], [place] [address] o [phone] o Mon-Fri 8:00-5:30, Sat (Oct.-Mar.) 9:00-1:00 o Babies seen by providers at [place] Medicaid o Must fill out new patient packet, available online at [email or web address] . [place] ([facility]) Barnabas Lister, NP;  Kenton Kingfisher, NP; Claiborne Billings, NP; Rolla Plate, MD; [place], [place]; Carola Rhine, MD; [place], MD; Delia Chimes, NP o [address], [place], [place] [address] o [phone] o Mon-Thur 8:00-5:30, Fri 8:00-5:00 o Babies seen by providers at [place] [phone]) . [place], [place]; [place], MD; Dennard Schaumann, MD; [place], [place] o [address] [place], [place] [address] o [phone] o Mon-Fri 8:00-5:00 o Babies seen by providers at [place] [phone]) . [place] at [place], [place]; Olen Pel, MD; [place], [place], [place], [place] [address] o [phone] o Mon-Fri 8:00-5:00 o Babies seen by providers at [facility] o Does NOT accept Medicaid o Limited appointment availability, please call early in hospitalization  . Therapist, music at [place], [place]; [place], [address] [place], [place] [address] o [phone] o Mon-Fri 8:00-5:00 o Babies seen by [facility] providers o Does NOT accept Medicaid . [facility] Su [place], MD; Guy Sandifer, MD; [place], [place]; [place], [place] [address], [employer], [place] [address] o [phone] o Mon-Fri 8:00-5:00 o After hours clinic [facility] [place], [place] [address]) [phone] Mon-Fri 5:00-8:00, Sat 12:00-6:00, Sun 10:00-4:00 o Babies seen by [facility] providers o Accepting Medicaid . [place] [facility] o [address] [place], [place]  [address] o [phone]   Fax - [phone]  [place] [phone]) . Therapist, music at [facility], MD o [address] [place], [place], [place] [address] o [phone] o Mon-Fri 8:00-5:00 o Babies seen by [facility] providers o Does NOT accept Medicaid . [place] ([place] [facility] [place]) Bing Neighbors, MD o [address]  [place], [place],  [address] o [phone] o Mon-Thur 8:00-7:00, Fri 8:00-5:00, Sat 8:00-12:00 o Babies seen by providers at [facility] o Accepting Medicaid - but does not have vaccinations in office (must be received elsewhere) o Limited availability, please call early in hospitalization  [place] ([address]) . [place], [place], [place] [address] o [phone]  Fax [phone]  Fetal Movement Counts Patient Name: ________________________________________________ Patient Due Date: ____________________  What is a fetal movement count? A fetal movement count is the number of times that you feel your baby move during a certain amount of time. This may also be called a fetal kick count. A fetal movement count is recommended for every pregnant woman. You may be asked to start counting fetal movements as early as week 28 of your pregnancy. Pay attention to when your baby is most active. You may notice your baby's sleep and wake cycles. You may also notice things that make your baby move more. You should do a fetal movement count:  When your baby is normally most active.  At the same time each day. A good time to count movements is while you are resting, after having something to eat and drink. How do I count fetal movements? 1. Find a quiet, comfortable area. Sit,  or lie down on your side. 2. Write down the date, the start time and stop time, and the number of movements that you felt between those two times. Take this information with you to your health care visits. 3. Write down your start time when you feel the first movement. 4. Count kicks, flutters, swishes, rolls, and jabs. You should feel at least 10 movements. 5. You may stop counting after you have felt 10 movements, or if you have been counting for 2 hours. Write down the stop time. 6. If you do not feel 10 movements in 2 hours, contact your health care provider for  further instructions. Your health care provider may want to do additional tests to assess your baby's well-being. Contact a health care provider if:  You feel fewer than 10 movements in 2 hours.  Your baby is not moving like he or she usually does. Date: ____________ Start time: ____________ Stop time: ____________ Movements: ____________ Date: ____________ Start time: ____________ Stop time: ____________ Movements: ____________ Date: ____________ Start time: ____________ Stop time: ____________ Movements: ____________ Date: ____________ Start time: ____________ Stop time: ____________ Movements: ____________ Date: ____________ Start time: ____________ Stop time: ____________ Movements: ____________ Date: ____________ Start time: ____________ Stop time: ____________ Movements: ____________ Date: ____________ Start time: ____________ Stop time: ____________ Movements: ____________ Date: ____________ Start time: ____________ Stop time: ____________ Movements: ____________ Date: ____________ Start time: ____________ Stop time: ____________ Movements: ____________ This information is not intended to replace advice given to you by your health care provider. Make sure you discuss any questions you have with your health care provider. Document Revised: 06/17/2019 Document Reviewed: 06/17/2019 Elsevier Patient Education  [place].

## 2021-01-12 LAB — GLUCOSE TOLERANCE, 2 HOURS W/ 1HR
Glucose, 1 hour: 99 mg/dL (ref 65–179)
Glucose, 2 hour: 87 mg/dL (ref 65–152)
Glucose, Fasting: 70 mg/dL (ref 65–91)

## 2021-01-17 LAB — CBC
Hematocrit: 34.9 % (ref 34.0–46.6)
Hemoglobin: 12 g/dL (ref 11.1–15.9)
MCH: 30.6 pg (ref 26.6–33.0)
MCHC: 34.4 g/dL (ref 31.5–35.7)
MCV: 89 fL (ref 79–97)
Platelets: 189 10*3/uL (ref 150–450)
RBC: 3.92 x10E6/uL (ref 3.77–5.28)
RDW: 12.9 % (ref 11.7–15.4)
WBC: 12.8 10*3/uL — ABNORMAL HIGH (ref 3.4–10.8)

## 2021-01-17 LAB — RPR, QUANT+TP ABS (REFLEX)
Rapid Plasma Reagin, Quant: 1:2 {titer} — ABNORMAL HIGH
T Pallidum Abs: REACTIVE — AB

## 2021-01-17 LAB — RPR: RPR Ser Ql: REACTIVE — AB

## 2021-01-17 LAB — HIV ANTIBODY (ROUTINE TESTING W REFLEX): HIV Screen 4th Generation wRfx: NONREACTIVE

## 2021-01-28 ENCOUNTER — Other Ambulatory Visit: Payer: Self-pay | Admitting: Nurse Practitioner

## 2021-01-29 ENCOUNTER — Ambulatory Visit (INDEPENDENT_AMBULATORY_CARE_PROVIDER_SITE_OTHER): Payer: Medicaid Other | Admitting: Nurse Practitioner

## 2021-01-29 ENCOUNTER — Other Ambulatory Visit: Payer: Self-pay

## 2021-01-29 VITALS — BP 137/83 | HR 100 | Wt 393.8 lb

## 2021-01-29 DIAGNOSIS — O10919 Unspecified pre-existing hypertension complicating pregnancy, unspecified trimester: Secondary | ICD-10-CM

## 2021-01-29 DIAGNOSIS — O0990 Supervision of high risk pregnancy, unspecified, unspecified trimester: Secondary | ICD-10-CM | POA: Diagnosis not present

## 2021-01-29 DIAGNOSIS — Z23 Encounter for immunization: Secondary | ICD-10-CM | POA: Diagnosis not present

## 2021-01-29 DIAGNOSIS — Z3A30 30 weeks gestation of pregnancy: Secondary | ICD-10-CM

## 2021-01-29 DIAGNOSIS — O98119 Syphilis complicating pregnancy, unspecified trimester: Secondary | ICD-10-CM

## 2021-01-29 DIAGNOSIS — R87612 Low grade squamous intraepithelial lesion on cytologic smear of cervix (LGSIL): Secondary | ICD-10-CM

## 2021-01-29 DIAGNOSIS — R7989 Other specified abnormal findings of blood chemistry: Secondary | ICD-10-CM

## 2021-01-29 DIAGNOSIS — O99210 Obesity complicating pregnancy, unspecified trimester: Secondary | ICD-10-CM

## 2021-01-29 NOTE — Patient Instructions (Addendum)
Childbirth Education Options: [facility] Department Classes:  Childbirth education classes can help you get ready for a positive parenting experience. You can also meet other expectant parents and get free stuff for your baby. Each class runs for five weeks on the same night and costs $45 for the mother-to-be and her support person. Medicaid covers the cost if you are eligible. Call [phone] to register. [facility] Childbirth Education:  [phone] or [phone] or [email or web address]  Baby & Me Class: Discuss newborn & infant parenting and family adjustment issues with other new mothers in a relaxed environment. Each week brings a new speaker or baby-centered activity. We encourage new mothers to join [place] every Thursday at 11:00am. Babies birth until crawling. No registration or fee. Daddy [employer]: This course offers Dads-to-be the tools and knowledge needed to feel confident on their journey to becoming new fathers. Experienced dads, who have been trained as coaches, teach dads-to-be how to hold, comfort, diaper, swaddle and play with their infant while being able to support the new mom as well. A class for men taught by men. $25/dad Big Brother/Big Sister: Let your children share in the joy of a new brother or sister in this special class designed just for them. Class includes discussion about how families care for babies: swaddling, holding, diapering, safety as well as how they can be helpful in their new role. This class is designed for children ages 36 to 59, but any age is welcome. Please register each child individually. $5/child  Mom Talk: This mom-led group offers support and connection to mothers as they journey through the adjustments and struggles of that sometimes overwhelming first year after the birth of a child. Tuesdays at 10:00am and Thursdays at 6:00pm. Babies welcome. No registration or fee. Breastfeeding Support Group: This group is  a mother-to-mother support circle where moms have the opportunity to share their breastfeeding experiences. A Lactation Consultant is present for questions and concerns. Meets each Tuesday at 11:00am. No fee or registration. Breastfeeding Your Baby: Learn what to expect in the first days of breastfeeding your newborn.  This class will help you feel more confident with the skills needed to begin your breastfeeding experience. Many new mothers are concerned about breastfeeding after leaving the hospital. This class will also address the most common fears and challenges about breastfeeding during the first few weeks, months and beyond. (call for fee) Comfort Techniques and Tour: This 2 hour interactive class will provide you the opportunity to learn & practice hands-on techniques that can help relieve some of the discomfort of labor and encourage your baby to rotate toward the best position for birth. You and your partner will be able to try a variety of labor positions with birth balls and rebozos as well as practice breathing, relaxation, and visualization techniques. A tour of the [facility] is included with this class. $20 per registrant and support person Childbirth Class- Weekend Option: This class is a Weekend version of our Birth & Baby series. It is designed for parents who have a difficult time fitting several weeks of classes into their schedule. It covers the care of your newborn and the basics of labor and childbirth. It also includes a [place] [facility] and lunch. The class is held two consecutive days: beginning on Friday evening from 6:30 - 8:30 p.m. and the next day, Saturday from 9 a.m. - 4 p.m. (call for fee) Doren Custard Class: Interested in a waterbirth?  This informational class will help you discover whether waterbirth is the right fit for you. Education about waterbirth itself, supplies you would need and how to assemble your support  team is what you can expect from this class. Some obstetrical practices require this class in order to pursue a waterbirth. (Not all obstetrical practices offer waterbirth-check with your healthcare provider.) Register only the expectant mom, but you are encouraged to bring your partner to class! Required if planning waterbirth, no fee. Infant/Child CPR: Parents, grandparents, babysitters, and friends learn Cardio-Pulmonary Resuscitation skills for infants and children. You will also learn how to treat both conscious and unconscious choking in infants and children. This Family & Friends program does not offer certification. Register each participant individually to ensure that enough mannequins are available. (Call for fee) Grandparent Love: Expecting a grandbaby? This class is for you! Learn about the latest infant care and safety recommendations and ways to support your own child as he or she transitions into the parenting role. Taught by Registered Nurses who are childbirth instructors, but most importantly...they are grandmothers too! $10/person. Childbirth Class- Natural Childbirth: This series of 5 weekly classes is for expectant parents who want to learn and practice natural methods of coping with the process of labor and childbirth. Relaxation, breathing, massage, visualization, role of the partner, and helpful positioning are highlighted. Participants learn how to be confident in their body's ability to give birth. This class will empower and help parents make informed decisions about their own care. Includes discussion that will help new parents transition into the immediate postpartum period. [facility] is included. We suggest taking this class between 25-32 weeks, but it's only a recommendation. $75 per registrant and one support person or $30 Medicaid. Childbirth Class- 3 week Series: This option of 3 weekly classes helps you and your labor partner prepare for  childbirth. Newborn care, labor & birth, cesarean birth, pain management, and comfort techniques are discussed and a [facility] is included. The class meets at the same time, on the same day of the week for 3 consecutive weeks beginning with the starting date you choose. $60 for registrant and one support person.  Marvelous Multiples: Expecting twins, triplets, or more? This class covers the differences in labor, birth, parenting, and breastfeeding issues that face multiples' parents. NICU tour is included. Led by a Certified Childbirth Educator who is the mother of twins. No fee. Caring for Baby: This class is for expectant and adoptive parents who want to learn and practice the most up-to-date newborn care for their babies. Focus is on birth through the first six weeks of life. Topics include feeding, bathing, diapering, crying, umbilical cord care, circumcision care and safe sleep. Parents learn to recognize symptoms of illness and when to call the pediatrician. Register only the mom-to-be and your partner or support person can plan to come with you! $10 per registrant and support person Childbirth Class- online option: This online class offers you the freedom to complete a Birth and Baby series in the comfort of your own home. The flexibility of this option allows you to review sections at your own pace, at times convenient to you and your support people. It includes additional video information, animations, quizzes, and extended activities. Get organized with helpful eClass tools, checklists, and trackers. Once you register online for the class, you will receive an email within a few days to accept the invitation and begin the class when the  time is right for you. The content will be available to you for 60 days. $60 for 60 days of online access for you and your support people.

## 2021-01-29 NOTE — Progress Notes (Addendum)
    Subjective:  Brandy Brock is a 25 y.o. G1P0000 at [redacted]w[redacted]d being seen today for ongoing prenatal care.  She is currently monitored for the following issues for this high-risk pregnancy and has BMI 60.0-69.9, adult ([place]); Asthma; Supervision of high risk pregnancy, antepartum; Syphilis affecting pregnancy; Obesity in pregnancy, antepartum; Chronic hypertension affecting pregnancy; LGSIL on Pap smear of cervix; Substance abuse affecting pregnancy in second trimester, antepartum; and Elevated liver function tests on their problem list.  Patient reports no complaints.  Contractions: Not present. Vag. Bleeding: None.  Movement: Present. Denies leaking of fluid.   The following portions of the patient's history were reviewed and updated as appropriate: allergies, current medications, past family history, past medical history, past social history, past surgical history and problem list. Problem list updated.  Objective:   Vitals:   01/29/21 0926  BP: 137/83  Pulse: 100  Weight: (!) 393 lb 12.8 oz (178.6 kg)    Fetal Status: Fetal Heart Rate (bpm): 137   Movement: Present     General:  Alert, oriented and cooperative. Patient is in no acute distress.  Skin: Skin is warm and dry. No rash noted.   Cardiovascular: Normal heart rate noted  Respiratory: Normal respiratory effort, no problems with respiration noted  Abdomen: Soft, gravid, appropriate for gestational age. Pain/Pressure: Absent     Pelvic:  Cervical exam deferred        Extremities: Normal range of motion.  Edema: Trace  Mental Status: Normal mood and affect. Normal behavior. Normal judgment and thought content.   Urinalysis:      Assessment and Plan:  Pregnancy: G1P0000 at [redacted]w[redacted]d  1. Supervision of high risk pregnancy, antepartum Advised to sign up for breastfeeding and childbirth classes Reviewed selecting a pediatrician Is considering contraceptive choices Is unsure if she had HPV vaccine - will check with her mother to  see if she remembers Reviewed that she could take the placenta but to contact an organization for encapsulation and plan to have placenta refrigerated or on ice  - Tdap vaccine greater than or equal to 7yo IM  2. Chronic hypertension affecting pregnancy On medication and is taking low dose aspirin Takes BP at home but does not put into babyscripts Advised to contact support at Babyscripts and have them help her enter her BP - advised to check BP weekly  3. Syphilis affecting pregnancy, antepartum   4. Elevated liver function tests Normal at [facility] in February   Preterm labor symptoms and general obstetric precautions including but not limited to vaginal bleeding, contractions, leaking of fluid and fetal movement were reviewed in detail with the patient. Please refer to After Visit Summary for other counseling recommendations.  Return in about 2 weeks (around 02/12/2021) for in person ROB.  Brandy Server, RN, MSN, NP-BC Nurse Practitioner, [facility] for [employer], [employer] 01/29/2021 9:47 AM

## 2021-01-31 ENCOUNTER — Other Ambulatory Visit: Payer: Self-pay | Admitting: Maternal & Fetal Medicine

## 2021-01-31 ENCOUNTER — Encounter: Payer: Self-pay | Admitting: *Deleted

## 2021-01-31 ENCOUNTER — Ambulatory Visit: Payer: Medicaid Other | Attending: Obstetrics and Gynecology

## 2021-01-31 ENCOUNTER — Other Ambulatory Visit: Payer: Self-pay

## 2021-01-31 ENCOUNTER — Ambulatory Visit: Payer: Medicaid Other | Admitting: *Deleted

## 2021-01-31 DIAGNOSIS — O99213 Obesity complicating pregnancy, third trimester: Secondary | ICD-10-CM | POA: Insufficient documentation

## 2021-01-31 DIAGNOSIS — O99513 Diseases of the respiratory system complicating pregnancy, third trimester: Secondary | ICD-10-CM

## 2021-01-31 DIAGNOSIS — Z6841 Body Mass Index (BMI) 40.0 and over, adult: Secondary | ICD-10-CM

## 2021-01-31 DIAGNOSIS — O10913 Unspecified pre-existing hypertension complicating pregnancy, third trimester: Secondary | ICD-10-CM | POA: Diagnosis not present

## 2021-01-31 DIAGNOSIS — O10013 Pre-existing essential hypertension complicating pregnancy, third trimester: Secondary | ICD-10-CM

## 2021-01-31 DIAGNOSIS — R7989 Other specified abnormal findings of blood chemistry: Secondary | ICD-10-CM

## 2021-01-31 DIAGNOSIS — O358XX0 Maternal care for other (suspected) fetal abnormality and damage, not applicable or unspecified: Secondary | ICD-10-CM

## 2021-01-31 DIAGNOSIS — O99210 Obesity complicating pregnancy, unspecified trimester: Secondary | ICD-10-CM

## 2021-01-31 DIAGNOSIS — Z3A30 30 weeks gestation of pregnancy: Secondary | ICD-10-CM | POA: Diagnosis not present

## 2021-01-31 DIAGNOSIS — R87612 Low grade squamous intraepithelial lesion on cytologic smear of cervix (LGSIL): Secondary | ICD-10-CM

## 2021-01-31 DIAGNOSIS — J45909 Unspecified asthma, uncomplicated: Secondary | ICD-10-CM | POA: Diagnosis not present

## 2021-01-31 DIAGNOSIS — O0990 Supervision of high risk pregnancy, unspecified, unspecified trimester: Secondary | ICD-10-CM

## 2021-01-31 DIAGNOSIS — O10919 Unspecified pre-existing hypertension complicating pregnancy, unspecified trimester: Secondary | ICD-10-CM

## 2021-02-05 ENCOUNTER — Encounter: Payer: Self-pay | Admitting: Family Medicine

## 2021-02-05 ENCOUNTER — Other Ambulatory Visit: Payer: Self-pay

## 2021-02-05 ENCOUNTER — Ambulatory Visit
Admission: EM | Admit: 2021-02-05 | Discharge: 2021-02-05 | Disposition: A | Discharge status: Home / Self Care | Payer: Medicaid Other

## 2021-02-12 ENCOUNTER — Other Ambulatory Visit: Payer: Self-pay

## 2021-02-12 ENCOUNTER — Ambulatory Visit (INDEPENDENT_AMBULATORY_CARE_PROVIDER_SITE_OTHER): Payer: Medicaid Other | Admitting: *Deleted

## 2021-02-12 ENCOUNTER — Ambulatory Visit: Payer: Self-pay

## 2021-02-12 ENCOUNTER — Encounter: Payer: Self-pay | Admitting: Nurse Practitioner

## 2021-02-12 ENCOUNTER — Ambulatory Visit (INDEPENDENT_AMBULATORY_CARE_PROVIDER_SITE_OTHER): Payer: Medicaid Other | Admitting: Nurse Practitioner

## 2021-02-12 VITALS — BP 136/81 | HR 111 | Wt 396.0 lb

## 2021-02-12 DIAGNOSIS — O10919 Unspecified pre-existing hypertension complicating pregnancy, unspecified trimester: Secondary | ICD-10-CM | POA: Diagnosis not present

## 2021-02-12 DIAGNOSIS — O99210 Obesity complicating pregnancy, unspecified trimester: Secondary | ICD-10-CM

## 2021-02-12 DIAGNOSIS — Z3A32 32 weeks gestation of pregnancy: Secondary | ICD-10-CM

## 2021-02-12 DIAGNOSIS — O0990 Supervision of high risk pregnancy, unspecified, unspecified trimester: Secondary | ICD-10-CM

## 2021-02-12 NOTE — Progress Notes (Signed)
Occasional pelvic pressure

## 2021-02-12 NOTE — Progress Notes (Signed)

## 2021-02-12 NOTE — Progress Notes (Signed)
    Subjective:  Brandy Brock is a 25 y.o. G1P0000 at [redacted]w[redacted]d being seen today for ongoing prenatal care.  She is currently monitored for the following issues for this high-risk pregnancy and has BMI 60.0-69.9, adult ([place]); Asthma; Supervision of high risk pregnancy, antepartum; Syphilis affecting pregnancy; Obesity in pregnancy, antepartum; Chronic hypertension affecting pregnancy; LGSIL on Pap smear of cervix; Substance abuse affecting pregnancy in second trimester, antepartum; and Elevated liver function tests on their problem list.  Patient reports occasional pelvic pressure.  Contractions: Not present. Vag. Bleeding: None.  Movement: Present. Denies leaking of fluid.   The following portions of the patient's history were reviewed and updated as appropriate: allergies, current medications, past family history, past medical history, past social history, past surgical history and problem list. Problem list updated.  Objective:   Vitals:   02/12/21 1008  BP: 136/81  Pulse: (!) 111  Weight: (!) 396 lb (179.6 kg)    Fetal Status: Fetal Heart Rate (bpm): 145   Movement: Present     General:  Alert, oriented and cooperative. Patient is in no acute distress.  Skin: Skin is warm and dry. No rash noted.   Cardiovascular: Normal heart rate noted  Respiratory: Normal respiratory effort, no problems with respiration noted  Abdomen: Soft, gravid, appropriate for gestational age. Pain/Pressure: Present     Pelvic:  Cervical exam deferred        Extremities: Normal range of motion.  Edema: Trace  Mental Status: Normal mood and affect. Normal behavior. Normal judgment and thought content.   Urinalysis:      Assessment and Plan:  Pregnancy: G1P0000 at [redacted]w[redacted]d  1. Supervision of high risk pregnancy, antepartum Taking BP meds and low dose aspirin Checking BP at home but not able to put into babyscripts - unable to get app to open, BP has been stable Reviewed symptoms of worsening BP - has not  had any Has not been able to sign up for childbirth classes due to work schedule  2. Chronic hypertension affecting pregnancy Getting BPP today and weekly Has [place] for growth on 02-28-21 Will have her see MD next visit for [facility] Advised will likely have induction prior to her due date due to chronic hypertension on meds   Preterm labor symptoms and general obstetric precautions including but not limited to vaginal bleeding, contractions, leaking of fluid and fetal movement were reviewed in detail with the patient. Please refer to After Visit Summary for other counseling recommendations.  Return in about 2 weeks (around 02/26/2021) for [facility] with MD.  Earlie Server, RN, MSN, NP-BC Nurse Practitioner, [facility] for [facility], [place] 02/12/2021 10:29 AM

## 2021-02-14 ENCOUNTER — Ambulatory Visit: Payer: Medicaid Other

## 2021-02-14 NOTE — Progress Notes (Signed)
NST:  Baseline: 135 bpm, Variability: Good {> 6 bpm), Accelerations: Reactive and Decelerations: Absent

## 2021-02-16 ENCOUNTER — Ambulatory Visit
Admission: EM | Admit: 2021-02-16 | Discharge: 2021-02-16 | Disposition: A | Discharge status: Home / Self Care | Payer: Medicaid Other | Attending: Emergency Medicine | Admitting: Emergency Medicine

## 2021-02-16 ENCOUNTER — Other Ambulatory Visit: Payer: Self-pay

## 2021-02-16 DIAGNOSIS — J029 Acute pharyngitis, unspecified: Secondary | ICD-10-CM | POA: Insufficient documentation

## 2021-02-16 DIAGNOSIS — Z3A33 33 weeks gestation of pregnancy: Secondary | ICD-10-CM | POA: Insufficient documentation

## 2021-02-16 DIAGNOSIS — J069 Acute upper respiratory infection, unspecified: Secondary | ICD-10-CM | POA: Insufficient documentation

## 2021-02-16 LAB — POCT RAPID STREP A (OFFICE): Rapid Strep A Screen: NEGATIVE

## 2021-02-16 NOTE — Discharge Instructions (Signed)
Your rapid strep test is negative.  A throat culture is pending; we will call you if it is positive requiring treatment.    Follow up with your OB/GYN if your symptoms are not improving.

## 2021-02-16 NOTE — ED Triage Notes (Signed)
Pt present sore throat with horsiness voice and nasal congestion and coughing. Symptoms started a few days ago.  Pt is 8 months pregnant

## 2021-02-16 NOTE — ED Provider Notes (Signed)
[facility]    CSN: [id number] Arrival date & time: 02/16/21  1307      History   Chief Complaint Chief Complaint  Patient presents with  . Sore Throat  . Nasal Congestion    HPI Brandy Brock is a 25 y.o. female.   Patient is 8 months pregnant.  She presents with 3-day history of nasal congestion, sore throat, cough.  No treatments attempted at home.  She denies fever, chills, rash, shortness of breath, abdominal pain, vomiting, diarrhea, or other symptoms.  Her medical history includes substance abuse affecting pregnancy, chronic hypertension affecting pregnancy, elevated liver function test, syphilis affecting pregnancy, obesity in pregnancy, high risk pregnancy, asthma.  The history is provided by the patient and medical records.    Past Medical History:  Diagnosis Date  . Asthma    childhood  . Hypertension   . Syphilis affecting pregnancy   . Vaginal Pap smear, abnormal     Patient Active Problem List   Diagnosis Date Noted  . Elevated liver function tests 12/11/2020  . 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 Supervision of high risk pregnancy, antepartum 10/26/2020  . LGSIL on Pap smear of cervix 10/26/2020  . Syphilis affecting pregnancy   . Obesity in pregnancy, antepartum   . Chronic hypertension affecting pregnancy   . BMI 60.0-69.9, adult ([place]) 10/13/2008  . Asthma 10/13/2008    Past Surgical History:  Procedure Laterality Date  . MULTIPLE EXTRACTIONS WITH ALVEOLOPLASTY N/A 10/11/2016   Procedure: MULTIPLE EXTRACTION WITH ALVEOLOPLASTY;  Surgeon: Diona Browner, DDS;  Location: [place];  Service: Oral Surgery;  Laterality: N/A;    OB History    Gravida  1   Para  0   Term  0   Preterm  0   AB  0   Living  0     SAB  0   IAB  0   Ectopic  0   Multiple  0   Live Births  0            Home Medications    Prior to Admission medications   Medication Sig Start Date End Date  Taking? Authorizing Provider  albuterol (VENTOLIN HFA) 108 (90 Base) MCG/ACT inhaler Inhale 2 puffs into the lungs every 6 (six) hours as needed for wheezing or shortness of breath. 11/13/20   Virginia Rochester, NP  aspirin EC 81 MG tablet Take 81 mg by mouth daily. Swallow whole.    [provider]  Blood Pressure Monitoring (BLOOD PRESSURE KIT) DEVI 1 Device by Does not apply route as needed. 10/26/20   Burleson, Rona Ravens, NP  NIFEdipine (PROCARDIA-XL/NIFEDICAL-XL) 30 MG 24 hr tablet Take 1 tablet (30 mg total) by mouth daily. 01/03/21   Aletha Halim, MD  ondansetron (ZOFRAN ODT) 8 MG disintegrating tablet Take 1 tablet (8 mg total) by mouth every 8 (eight) hours as needed for nausea or vomiting. 11/18/20   Starr Lake, CNM  Prenatal Vit-Fe Fumarate-FA (PRENATAL VITAMIN) 27-0.8 MG TABS Take 1 tablet by mouth daily. 11/01/20   Clarnce Flock, MD    Family History Family History  Problem Relation Age of Onset  . Diabetes Father   . Diabetes Brother     Social History Social History   Tobacco Use  . Smoking status: Never Smoker  . Smokeless tobacco: Former Network engineer  . Vaping Use: Never used  Substance Use Topics  . Alcohol use: No  .  Drug use: No     Allergies   Peanut-containing drug products   Review of Systems Review of Systems  Constitutional: Negative for chills and fever.  HENT: Positive for congestion and sore throat. Negative for ear pain.   Eyes: Negative for pain and visual disturbance.  Respiratory: Positive for cough. Negative for shortness of breath.   Cardiovascular: Negative for chest pain and palpitations.  Gastrointestinal: Negative for abdominal pain, diarrhea and vomiting.  Genitourinary: Negative for dysuria and hematuria.  Musculoskeletal: Negative for arthralgias and back pain.  Skin: Negative for color change and rash.  Neurological: Negative for seizures and syncope.  All other systems reviewed and are  negative.    Physical Exam Triage Vital Signs ED Triage Vitals  Enc Vitals Group     BP 02/16/21 1327 (!) 147/91     Pulse Rate 02/16/21 1327 (!) 101     Resp 02/16/21 1327 18     Temp 02/16/21 1327 97.6 F (36.4 C)     Temp Source 02/16/21 1327 Oral     SpO2 02/16/21 1327 99 %     Weight --      Height --      Head Circumference --      Peak Flow --      Pain Score 02/16/21 1329 5     Pain Loc --      Pain Edu? --      Excl. in [place]? --    No data found.  Updated Vital Signs BP (!) 147/91 (BP Location: Right Arm)   Pulse (!) 101   Temp 97.6 F (36.4 C) (Oral)   Resp 18   LMP 06/03/2020   SpO2 99%   Visual Acuity Right Eye Distance:   Left Eye Distance:   Bilateral Distance:    Right Eye Near:   Left Eye Near:    Bilateral Near:     Physical Exam Vitals and nursing note reviewed.  Constitutional:      General: She is not in acute distress.    Appearance: She is well-developed. She is obese. She is not ill-appearing.  HENT:     Head: Normocephalic and atraumatic.     Right Ear: Tympanic membrane normal.     Left Ear: Tympanic membrane normal.     Nose: Nose normal.     Mouth/Throat:     Mouth: Mucous membranes are moist.     Pharynx: No oropharyngeal exudate or posterior oropharyngeal erythema.     Tonsils: 0 on the right. 0 on the left.  Eyes:     Conjunctiva/sclera: Conjunctivae normal.  Cardiovascular:     Rate and Rhythm: Normal rate and regular rhythm.     Heart sounds: Normal heart sounds.  Pulmonary:     Effort: Pulmonary effort is normal. No respiratory distress.     Breath sounds: Normal breath sounds.  Abdominal:     Palpations: Abdomen is soft.     Tenderness: There is no abdominal tenderness.  Musculoskeletal:     Cervical back: Neck supple.  Skin:    General: Skin is warm and dry.  Neurological:     General: No focal deficit present.     Mental Status: She is alert and oriented to person, place, and time.  Psychiatric:        Mood  and Affect: Mood normal.        Behavior: Behavior normal.      UC Treatments / Results  Labs (all labs ordered are  listed, but only abnormal results are displayed) Labs Reviewed  CULTURE, GROUP A STREP [facility])  POCT RAPID STREP A (OFFICE)    EKG   Radiology No results found.  Procedures Procedures (including critical care time)  Medications Ordered in UC Medications - No data to display  Initial Impression / Assessment and Plan / UC Course  I have reviewed the triage vital signs and the nursing notes.  Pertinent labs & imaging results that were available during my care of the patient were reviewed by me and considered in my medical decision making (see chart for details).   Upper respiratory tract infection, sore throat.  Pregnant, patient reports she was [redacted] weeks pregnant.  Rapid strep negative; culture pending.  Instructed patient to take Tylenol as needed for discomfort and to follow-up with her OB/GYN if her symptoms are not improving.  Work note provided per patient request.  She agrees to plan of care.   Final Clinical Impressions(s) / UC Diagnoses   Final diagnoses:  Upper respiratory tract infection, unspecified type  Sore throat  [redacted] weeks gestation of pregnancy     Discharge Instructions     Your rapid strep test is negative.  A throat culture is pending; we will call you if it is positive requiring treatment.    Follow up with your OB/GYN if your symptoms are not improving.         ED Prescriptions    None     PDMP not reviewed this encounter.   Sharion Balloon, NP 02/16/21 [phone]

## 2021-02-18 LAB — CULTURE, GROUP A STREP (THRC)

## 2021-02-21 ENCOUNTER — Ambulatory Visit (INDEPENDENT_AMBULATORY_CARE_PROVIDER_SITE_OTHER): Payer: Medicaid Other

## 2021-02-21 ENCOUNTER — Ambulatory Visit: Payer: Medicaid Other

## 2021-02-21 ENCOUNTER — Ambulatory Visit (INDEPENDENT_AMBULATORY_CARE_PROVIDER_SITE_OTHER): Payer: Medicaid Other | Admitting: General Practice

## 2021-02-21 ENCOUNTER — Other Ambulatory Visit: Payer: Self-pay

## 2021-02-21 VITALS — BP 161/96 | HR 88 | Wt 391.0 lb

## 2021-02-21 DIAGNOSIS — O10919 Unspecified pre-existing hypertension complicating pregnancy, unspecified trimester: Secondary | ICD-10-CM

## 2021-02-21 DIAGNOSIS — O10913 Unspecified pre-existing hypertension complicating pregnancy, third trimester: Secondary | ICD-10-CM | POA: Diagnosis not present

## 2021-02-21 DIAGNOSIS — Z3A33 33 weeks gestation of pregnancy: Secondary | ICD-10-CM

## 2021-02-21 DIAGNOSIS — O0990 Supervision of high risk pregnancy, unspecified, unspecified trimester: Secondary | ICD-10-CM

## 2021-02-21 NOTE — Progress Notes (Signed)
Pt informed that the ultrasound is considered a limited OB ultrasound and is not intended to be a complete ultrasound exam.  Patient also informed that the ultrasound is not being completed with the intent of assessing for fetal or placental anomalies or any pelvic abnormalities.  Explained that the purpose of today's ultrasound is to assess for  BPP, presentation and AFI.  Patient acknowledges the purpose of the exam and the limitations of the study.   Patient has elevated BP today of 161/89 & 161/96- reports she hasn't taken her BP medication yet this morning. Discussed with Dr Rip Harbour who recommends patient return tomorrow for repeat BP check in office. Discussed with patient and advised she take medicine today and tomorrow before she comes in at least 2 hours prior. Patient verbalized understanding.  Koren Bound RN BSN 02/21/21

## 2021-02-22 ENCOUNTER — Ambulatory Visit (INDEPENDENT_AMBULATORY_CARE_PROVIDER_SITE_OTHER): Payer: Medicaid Other | Admitting: Pharmacist

## 2021-02-22 VITALS — BP 127/62 | Resp 99 | Wt 391.5 lb

## 2021-02-22 DIAGNOSIS — O10919 Unspecified pre-existing hypertension complicating pregnancy, unspecified trimester: Secondary | ICD-10-CM

## 2021-02-22 NOTE — Progress Notes (Signed)
Patient was assessed and managed by nursing staff during this encounter. I have reviewed the chart and agree with the documentation and plan.   Verita Schneiders, MD 02/22/2021 8:06 AM

## 2021-02-22 NOTE — Progress Notes (Signed)
Patient here for 1-day BP recheck. Patient's BP yesterday (4/13) were 160s/90s. Today BP was 137/93 followed by 127/62 at repeat. Patient reports compliance with Procardia XL 30mg /d. She mentions taking it at night previously but switched to taking it in the morning. She reports missing her dose yesterday morning but taking it as soon as she got home from her appointment. No side effects noted and no additional complaints. Spoke with Dr. Elgie Congo about patient and agree to continue procardia XL 30mg /d.

## 2021-02-28 ENCOUNTER — Encounter: Payer: Self-pay | Admitting: Pediatrics

## 2021-02-28 ENCOUNTER — Ambulatory Visit (INDEPENDENT_AMBULATORY_CARE_PROVIDER_SITE_OTHER): Payer: Medicaid Other | Admitting: Obstetrics and Gynecology

## 2021-02-28 ENCOUNTER — Other Ambulatory Visit: Payer: Self-pay

## 2021-02-28 ENCOUNTER — Ambulatory Visit: Payer: Medicaid Other | Attending: Maternal & Fetal Medicine

## 2021-02-28 ENCOUNTER — Ambulatory Visit (INDEPENDENT_AMBULATORY_CARE_PROVIDER_SITE_OTHER): Payer: Self-pay | Admitting: Pediatrics

## 2021-02-28 ENCOUNTER — Encounter: Payer: Self-pay | Admitting: *Deleted

## 2021-02-28 ENCOUNTER — Ambulatory Visit: Payer: Medicaid Other | Admitting: *Deleted

## 2021-02-28 VITALS — BP 148/89 | HR 90 | Wt 393.0 lb

## 2021-02-28 DIAGNOSIS — O10013 Pre-existing essential hypertension complicating pregnancy, third trimester: Secondary | ICD-10-CM | POA: Diagnosis present

## 2021-02-28 DIAGNOSIS — J45909 Unspecified asthma, uncomplicated: Secondary | ICD-10-CM

## 2021-02-28 DIAGNOSIS — J4520 Mild intermittent asthma, uncomplicated: Secondary | ICD-10-CM

## 2021-02-28 DIAGNOSIS — O99210 Obesity complicating pregnancy, unspecified trimester: Secondary | ICD-10-CM | POA: Insufficient documentation

## 2021-02-28 DIAGNOSIS — O10919 Unspecified pre-existing hypertension complicating pregnancy, unspecified trimester: Secondary | ICD-10-CM

## 2021-02-28 DIAGNOSIS — Z362 Encounter for other antenatal screening follow-up: Secondary | ICD-10-CM

## 2021-02-28 DIAGNOSIS — O358XX0 Maternal care for other (suspected) fetal abnormality and damage, not applicable or unspecified: Secondary | ICD-10-CM | POA: Diagnosis not present

## 2021-02-28 DIAGNOSIS — Z6841 Body Mass Index (BMI) 40.0 and over, adult: Secondary | ICD-10-CM

## 2021-02-28 DIAGNOSIS — O0990 Supervision of high risk pregnancy, unspecified, unspecified trimester: Secondary | ICD-10-CM | POA: Diagnosis present

## 2021-02-28 DIAGNOSIS — Z7681 Expectant parent(s) prebirth pediatrician visit: Secondary | ICD-10-CM

## 2021-02-28 DIAGNOSIS — O99513 Diseases of the respiratory system complicating pregnancy, third trimester: Secondary | ICD-10-CM | POA: Diagnosis not present

## 2021-02-28 DIAGNOSIS — R7989 Other specified abnormal findings of blood chemistry: Secondary | ICD-10-CM

## 2021-02-28 DIAGNOSIS — R87612 Low grade squamous intraepithelial lesion on cytologic smear of cervix (LGSIL): Secondary | ICD-10-CM | POA: Insufficient documentation

## 2021-02-28 DIAGNOSIS — Z3A34 34 weeks gestation of pregnancy: Secondary | ICD-10-CM | POA: Insufficient documentation

## 2021-02-28 NOTE — Progress Notes (Signed)
   PRENATAL VISIT NOTE  Subjective:  Brandy Brock is a 25 y.o. G1P0000 at [redacted]w[redacted]d being seen today for ongoing prenatal care.  She is currently monitored for the following issues for this high-risk pregnancy and has BMI 60.0-69.9, adult ([place]); Asthma; Supervision of high risk pregnancy, antepartum; Syphilis affecting pregnancy; Obesity in pregnancy, antepartum; Chronic hypertension affecting pregnancy; LGSIL on Pap smear of cervix; Substance abuse affecting pregnancy in second trimester, antepartum; Elevated liver function tests; and [redacted] weeks gestation of pregnancy on their problem list.  Patient doing well with no acute concerns today. She reports no complaints.  Contractions: Irregular. Vag. Bleeding: None.  Movement: Present. Denies leaking of fluid.   When asked specifically, pt denies headache, visual changes and right upper quadrant pain.  The following portions of the patient's history were reviewed and updated as appropriate: allergies, current medications, past family history, past medical history, past social history, past surgical history and problem list. Problem list updated.  Objective:   Vitals:   02/28/21 0905  BP: (!) 148/89  Pulse: 90  Weight: (!) 393 lb (178.3 kg)    Fetal Status: Fetal Heart Rate (bpm): 134 Fundal Height: 34 cm Movement: Present     General:  Alert, oriented and cooperative. Patient is in no acute distress.  Skin: Skin is warm and dry. No rash noted.   Cardiovascular: Normal heart rate noted  Respiratory: Normal respiratory effort, no problems with respiration noted  Abdomen: Soft, gravid, appropriate for gestational age. obese  Pain/Pressure: Present     Pelvic: Cervical exam deferred        Extremities: Normal range of motion.  Edema: Trace  Mental Status:  Normal mood and affect. Normal behavior. Normal judgment and thought content.   Assessment and Plan:  Pregnancy: G1P0000 at [redacted]w[redacted]d  1. Chronic hypertension affecting pregnancy Pt has no  signs of severe preeclampsia, labs ordered for today, if BP elevated at next visit, would schedule pt for IOL at 37 weeks - CBC - Comprehensive metabolic panel - Protein / creatinine ratio, urine  2. Supervision of high risk pregnancy, antepartum   3. Intermittent asthma without complication, unspecified asthma severity   4. BMI 60.0-69.9, adult (HCC)   5. [redacted] weeks gestation of pregnancy   Preterm labor symptoms and general obstetric precautions including but not limited to vaginal bleeding, contractions, leaking of fluid and fetal movement were reviewed in detail with the patient.  Preeclampsia and decreased fetal movement precautions given.  Please refer to After Visit Summary for other counseling recommendations.   Return in about 1 week (around 03/07/2021) for [facility], in person, 36 weeks swabs.   Lynnda Shields, MD Faculty Attending Center for [facility]

## 2021-02-28 NOTE — Progress Notes (Signed)
Prenatal counseling for impending newborn done. Prenatal care started around [redacted] weeks gestation. Mom followed for GHTN. Vaccine policy discussed, mom agrees to vaccinate.

## 2021-02-28 NOTE — Progress Notes (Signed)
Patient complains of everyday pain/pressure on pelvic. Patient also believes that she is having contractions about "once or twice a day", everyday. She rates it about a "5"

## 2021-03-01 LAB — COMPREHENSIVE METABOLIC PANEL
ALT: 17 IU/L (ref 0–32)
AST: 17 IU/L (ref 0–40)
Albumin/Globulin Ratio: 1.1 — ABNORMAL LOW (ref 1.2–2.2)
Albumin: 3.6 g/dL — ABNORMAL LOW (ref 3.9–5.0)
Alkaline Phosphatase: 72 IU/L (ref 44–121)
BUN/Creatinine Ratio: 14 (ref 9–23)
BUN: 7 mg/dL (ref 6–20)
Bilirubin Total: 0.3 mg/dL (ref 0.0–1.2)
CO2: 19 mmol/L — ABNORMAL LOW (ref 20–29)
Calcium: 9.4 mg/dL (ref 8.7–10.2)
Chloride: 101 mmol/L (ref 96–106)
Creatinine, Ser: 0.51 mg/dL — ABNORMAL LOW (ref 0.57–1.00)
Globulin, Total: 3.3 g/dL (ref 1.5–4.5)
Glucose: 83 mg/dL (ref 65–99)
Potassium: 4 mmol/L (ref 3.5–5.2)
Sodium: 135 mmol/L (ref 134–144)
Total Protein: 6.9 g/dL (ref 6.0–8.5)
eGFR: 134 mL/min/{1.73_m2} (ref >59)

## 2021-03-01 LAB — PROTEIN / CREATININE RATIO, URINE
Creatinine, Urine: 197.2 mg/dL
Protein, Ur: 29.5 mg/dL
Protein/Creat Ratio: 150 mg/g creat (ref 0–200)

## 2021-03-01 LAB — CBC
Hematocrit: 35.4 % (ref 34.0–46.6)
Hemoglobin: 11.7 g/dL (ref 11.1–15.9)
MCH: 29.3 pg (ref 26.6–33.0)
MCHC: 33.1 g/dL (ref 31.5–35.7)
MCV: 89 fL (ref 79–97)
Platelets: 181 10*3/uL (ref 150–450)
RBC: 4 x10E6/uL (ref 3.77–5.28)
RDW: 13.1 % (ref 11.7–15.4)
WBC: 13.8 10*3/uL — ABNORMAL HIGH (ref 3.4–10.8)

## 2021-03-05 ENCOUNTER — Ambulatory Visit (INDEPENDENT_AMBULATORY_CARE_PROVIDER_SITE_OTHER): Payer: Medicaid Other | Admitting: Pharmacist

## 2021-03-05 ENCOUNTER — Telehealth: Payer: Self-pay | Admitting: Lactation Services

## 2021-03-05 VITALS — BP 137/82 | Wt 394.5 lb

## 2021-03-05 DIAGNOSIS — O10919 Unspecified pre-existing hypertension complicating pregnancy, unspecified trimester: Secondary | ICD-10-CM

## 2021-03-05 NOTE — Telephone Encounter (Signed)
Called patient and she reports she has not taken her BP this morning. She has not had any headache or blurred vision.   She has not taken today. She took it yesterday and it was in the 150's over 70-80's   She has had some swelling to her feet and ankles that has been there and no nausea or abdominal pain.   Patient scheduled to come into the office at 9:40 for BP check by Lolita Rieger, [facility]. Message to front office to place on schedule.

## 2021-03-05 NOTE — Progress Notes (Signed)
Patient has no complaints or signs/symptoms. She reported BPs over the weekend of 150-160s/80s. She is taking her procardia as prescribed (30mg /d). Her BP today is within range of 137/82. Will continue procardia 30mg /d for now and reevaluate at appointment on Thursday. Spoke with Dr. Rip Harbour who is in agreement with plan.

## 2021-03-07 ENCOUNTER — Encounter: Payer: Medicaid Other | Admitting: Family Medicine

## 2021-03-08 ENCOUNTER — Encounter: Payer: Self-pay | Admitting: Obstetrics and Gynecology

## 2021-03-08 ENCOUNTER — Ambulatory Visit (INDEPENDENT_AMBULATORY_CARE_PROVIDER_SITE_OTHER): Payer: Medicaid Other | Admitting: Obstetrics and Gynecology

## 2021-03-08 ENCOUNTER — Encounter (HOSPITAL_COMMUNITY): Payer: Self-pay | Admitting: *Deleted

## 2021-03-08 ENCOUNTER — Telehealth (HOSPITAL_COMMUNITY): Payer: Self-pay | Admitting: *Deleted

## 2021-03-08 ENCOUNTER — Ambulatory Visit: Payer: Medicaid Other | Admitting: *Deleted

## 2021-03-08 ENCOUNTER — Ambulatory Visit (INDEPENDENT_AMBULATORY_CARE_PROVIDER_SITE_OTHER): Payer: Medicaid Other

## 2021-03-08 ENCOUNTER — Other Ambulatory Visit (HOSPITAL_COMMUNITY)
Admission: RE | Admit: 2021-03-08 | Discharge: 2021-03-08 | Disposition: A | Discharge status: Home / Self Care | Payer: Medicaid Other | Source: Ambulatory Visit | Attending: Family Medicine | Admitting: Family Medicine

## 2021-03-08 ENCOUNTER — Encounter: Payer: Medicaid Other | Admitting: Obstetrics & Gynecology

## 2021-03-08 VITALS — BP 169/84 | HR 99 | Wt 397.3 lb

## 2021-03-08 DIAGNOSIS — O0990 Supervision of high risk pregnancy, unspecified, unspecified trimester: Secondary | ICD-10-CM | POA: Diagnosis not present

## 2021-03-08 DIAGNOSIS — Z3A36 36 weeks gestation of pregnancy: Secondary | ICD-10-CM | POA: Diagnosis not present

## 2021-03-08 DIAGNOSIS — O98113 Syphilis complicating pregnancy, third trimester: Secondary | ICD-10-CM

## 2021-03-08 DIAGNOSIS — O10919 Unspecified pre-existing hypertension complicating pregnancy, unspecified trimester: Secondary | ICD-10-CM

## 2021-03-08 NOTE — Patient Instructions (Signed)

## 2021-03-08 NOTE — Progress Notes (Signed)
Subjective:  Brandy Brock is a 25 y.o. G1P0000 at [redacted]w[redacted]d being seen today for ongoing prenatal care.  She is currently monitored for the following issues for this high-risk pregnancy and has BMI 60.0-69.9, adult ([place]); Asthma; Supervision of high risk pregnancy, antepartum; Syphilis affecting pregnancy; Obesity in pregnancy, antepartum; Chronic hypertension affecting pregnancy; LGSIL on Pap smear of cervix; Substance abuse affecting pregnancy in second trimester, antepartum; and Pediatric pre-birth visit for expectant parent on their problem list.  Patient reports general discomforts of pregnancy.  Contractions: Irritability. Vag. Bleeding: None.  Movement: Present. Denies leaking of fluid.   The following portions of the patient's history were reviewed and updated as appropriate: allergies, current medications, past family history, past medical history, past social history, past surgical history and problem list. Problem list updated.  Objective:   Vitals:   03/08/21 1450  BP: (!) 169/84  Pulse: 99  Weight: (!) 397 lb 4.8 oz (180.2 kg)    Fetal Status: Fetal Heart Rate (bpm): 137   Movement: Present     General:  Alert, oriented and cooperative. Patient is in no acute distress.  Skin: Skin is warm and dry. No rash noted.   Cardiovascular: Normal heart rate noted  Respiratory: Normal respiratory effort, no problems with respiration noted  Abdomen: Soft, gravid, appropriate for gestational age. Pain/Pressure: Present     Pelvic:  Cervical exam performed        Extremities: Normal range of motion.  Edema: Trace  Mental Status: Normal mood and affect. Normal behavior. Normal judgment and thought content.   Urinalysis:      Assessment and Plan:  Pregnancy: G1P0000 at [redacted]w[redacted]d  1. Supervision of high risk pregnancy, antepartum Stable Labor precautions GBS/vaginal cultures  2. Chronic hypertension affecting pregnancy BP elevated Will continue with Procardia BPP/NST today Will  schedule IOL at 37 weeks  3. Syphilis affecting pregnancy in third trimester S/P treatment  Term labor symptoms and general obstetric precautions including but not limited to vaginal bleeding, contractions, leaking of fluid and fetal movement were reviewed in detail with the patient. Please refer to After Visit Summary for other counseling recommendations.  Return for IOL for.   Chancy Milroy, MD

## 2021-03-08 NOTE — Telephone Encounter (Signed)
Preadmission screen  

## 2021-03-09 LAB — GC/CHLAMYDIA PROBE AMP (~~LOC~~) NOT AT ARMC
Chlamydia: NEGATIVE
Comment: NEGATIVE
Comment: NORMAL
Neisseria Gonorrhea: NEGATIVE

## 2021-03-09 NOTE — Addendum Note (Signed)
Addended by: Chancy Milroy on: 03/09/2021 11:06 AM   Modules accepted: Orders, SmartSet

## 2021-03-12 ENCOUNTER — Other Ambulatory Visit: Payer: Self-pay | Admitting: Advanced Practice Midwife

## 2021-03-13 ENCOUNTER — Other Ambulatory Visit (HOSPITAL_COMMUNITY)
Admission: RE | Admit: 2021-03-13 | Discharge: 2021-03-13 | Disposition: A | Discharge status: Home / Self Care | Payer: Medicaid Other | Source: Ambulatory Visit | Attending: Obstetrics & Gynecology | Admitting: Obstetrics & Gynecology

## 2021-03-13 DIAGNOSIS — Z01812 Encounter for preprocedural laboratory examination: Secondary | ICD-10-CM | POA: Diagnosis not present

## 2021-03-13 DIAGNOSIS — Z20822 Contact with and (suspected) exposure to covid-19: Secondary | ICD-10-CM | POA: Diagnosis not present

## 2021-03-13 LAB — CULTURE, BETA STREP (GROUP B ONLY): Strep Gp B Culture: NEGATIVE

## 2021-03-13 LAB — SARS CORONAVIRUS 2 (TAT 6-24 HRS): SARS Coronavirus 2: NEGATIVE

## 2021-03-14 ENCOUNTER — Other Ambulatory Visit: Payer: Self-pay | Admitting: Advanced Practice Midwife

## 2021-03-15 ENCOUNTER — Inpatient Hospital Stay (HOSPITAL_COMMUNITY)
Admission: AD | Admit: 2021-03-15 | Discharge: 2021-03-20 | DRG: 788 | Disposition: A | Discharge status: Home / Self Care | Payer: Medicaid Other | Attending: Obstetrics and Gynecology | Admitting: Obstetrics and Gynecology

## 2021-03-15 ENCOUNTER — Inpatient Hospital Stay (HOSPITAL_COMMUNITY): Payer: Medicaid Other

## 2021-03-15 ENCOUNTER — Encounter (HOSPITAL_COMMUNITY): Payer: Self-pay | Admitting: Obstetrics and Gynecology

## 2021-03-15 ENCOUNTER — Other Ambulatory Visit: Payer: Self-pay

## 2021-03-15 DIAGNOSIS — O99210 Obesity complicating pregnancy, unspecified trimester: Secondary | ICD-10-CM | POA: Diagnosis present

## 2021-03-15 DIAGNOSIS — O99214 Obesity complicating childbirth: Secondary | ICD-10-CM | POA: Diagnosis present

## 2021-03-15 DIAGNOSIS — O10919 Unspecified pre-existing hypertension complicating pregnancy, unspecified trimester: Secondary | ICD-10-CM | POA: Diagnosis present

## 2021-03-15 DIAGNOSIS — Z6841 Body Mass Index (BMI) 40.0 and over, adult: Secondary | ICD-10-CM

## 2021-03-15 DIAGNOSIS — O1002 Pre-existing essential hypertension complicating childbirth: Secondary | ICD-10-CM | POA: Diagnosis present

## 2021-03-15 DIAGNOSIS — O98119 Syphilis complicating pregnancy, unspecified trimester: Secondary | ICD-10-CM | POA: Diagnosis present

## 2021-03-15 DIAGNOSIS — O9952 Diseases of the respiratory system complicating childbirth: Secondary | ICD-10-CM | POA: Diagnosis present

## 2021-03-15 DIAGNOSIS — O0990 Supervision of high risk pregnancy, unspecified, unspecified trimester: Secondary | ICD-10-CM

## 2021-03-15 DIAGNOSIS — D259 Leiomyoma of uterus, unspecified: Secondary | ICD-10-CM | POA: Diagnosis present

## 2021-03-15 DIAGNOSIS — O365930 Maternal care for other known or suspected poor fetal growth, third trimester, not applicable or unspecified: Secondary | ICD-10-CM | POA: Diagnosis present

## 2021-03-15 DIAGNOSIS — J45909 Unspecified asthma, uncomplicated: Secondary | ICD-10-CM | POA: Diagnosis present

## 2021-03-15 DIAGNOSIS — Z3A37 37 weeks gestation of pregnancy: Secondary | ICD-10-CM | POA: Diagnosis not present

## 2021-03-15 DIAGNOSIS — O99322 Drug use complicating pregnancy, second trimester: Secondary | ICD-10-CM | POA: Diagnosis present

## 2021-03-15 DIAGNOSIS — O114 Pre-existing hypertension with pre-eclampsia, complicating childbirth: Principal | ICD-10-CM | POA: Diagnosis present

## 2021-03-15 DIAGNOSIS — O1092 Unspecified pre-existing hypertension complicating childbirth: Secondary | ICD-10-CM | POA: Diagnosis not present

## 2021-03-15 DIAGNOSIS — O3413 Maternal care for benign tumor of corpus uteri, third trimester: Secondary | ICD-10-CM | POA: Diagnosis present

## 2021-03-15 DIAGNOSIS — R87612 Low grade squamous intraepithelial lesion on cytologic smear of cervix (LGSIL): Secondary | ICD-10-CM | POA: Diagnosis present

## 2021-03-15 LAB — COMPREHENSIVE METABOLIC PANEL
ALT: 18 U/L (ref 0–44)
AST: 19 U/L (ref 15–41)
Albumin: 2.7 g/dL — ABNORMAL LOW (ref 3.5–5.0)
Alkaline Phosphatase: 62 U/L (ref 38–126)
Anion gap: 9 (ref 5–15)
BUN: 7 mg/dL (ref 6–20)
CO2: 22 mmol/L (ref 22–32)
Calcium: 9.1 mg/dL (ref 8.9–10.3)
Chloride: 103 mmol/L (ref 98–111)
Creatinine, Ser: 0.53 mg/dL (ref 0.44–1.00)
GFR, Estimated: >60 mL/min (ref >60)
Glucose, Bld: 106 mg/dL — ABNORMAL HIGH (ref 70–99)
Potassium: 3.7 mmol/L (ref 3.5–5.1)
Sodium: 134 mmol/L — ABNORMAL LOW (ref 135–145)
Total Bilirubin: 0.5 mg/dL (ref 0.3–1.2)
Total Protein: 6.8 g/dL (ref 6.5–8.1)

## 2021-03-15 LAB — PROTEIN / CREATININE RATIO, URINE
Creatinine, Urine: 73.25 mg/dL
Protein Creatinine Ratio: 0.37 mg/mg{Cre} — ABNORMAL HIGH (ref 0.00–0.15)
Total Protein, Urine: 27 mg/dL

## 2021-03-15 LAB — CBC
HCT: 34.4 % — ABNORMAL LOW (ref 36.0–46.0)
Hemoglobin: 11.6 g/dL — ABNORMAL LOW (ref 12.0–15.0)
MCH: 29.8 pg (ref 26.0–34.0)
MCHC: 33.7 g/dL (ref 30.0–36.0)
MCV: 88.4 fL (ref 80.0–100.0)
Platelets: 186 10*3/uL (ref 150–400)
RBC: 3.89 MIL/uL (ref 3.87–5.11)
RDW: 13.5 % (ref 11.5–15.5)
WBC: 12.6 10*3/uL — ABNORMAL HIGH (ref 4.0–10.5)
nRBC: 0 % (ref 0.0–0.2)

## 2021-03-15 LAB — TYPE AND SCREEN
ABO/RH(D): B POS
Antibody Screen: NEGATIVE

## 2021-03-15 MED ORDER — LACTATED RINGERS IV SOLN
500.0000 mL | INTRAVENOUS | Status: DC | PRN
Start: 2021-03-15 — End: 2021-03-17

## 2021-03-15 MED ORDER — MISOPROSTOL 50MCG HALF TABLET
ORAL_TABLET | ORAL | Status: AC
  Filled 2021-03-15: qty 1

## 2021-03-15 MED ORDER — MISOPROSTOL 50MCG HALF TABLET
50.0000 ug | ORAL_TABLET | ORAL | Status: DC
  Administered 2021-03-15 – 2021-03-16 (×3): 50 ug via BUCCAL
  Filled 2021-03-15 (×2): qty 1

## 2021-03-15 MED ORDER — OXYTOCIN BOLUS FROM INFUSION: 333.0000 mL | Freq: Once | INTRAVENOUS | Status: DC

## 2021-03-15 MED ORDER — SOD CITRATE-CITRIC ACID 500-334 MG/5ML PO SOLN
30.0000 mL | ORAL | Status: DC | PRN
  Filled 2021-03-15: qty 15

## 2021-03-15 MED ORDER — LIDOCAINE HCL (PF) 1 % IJ SOLN: 30.0000 mL | INTRAMUSCULAR | Status: DC | PRN

## 2021-03-15 MED ORDER — OXYTOCIN-SODIUM CHLORIDE 30-0.9 UT/500ML-% IV SOLN: 2.5000 [IU]/h | INTRAVENOUS | Status: DC

## 2021-03-15 MED ORDER — OXYCODONE-ACETAMINOPHEN 5-325 MG PO TABS: 2.0000 | ORAL_TABLET | ORAL | Status: DC | PRN

## 2021-03-15 MED ORDER — ZOLPIDEM TARTRATE 5 MG PO TABS: 5.0000 mg | ORAL_TABLET | Freq: Every evening | ORAL | Status: DC | PRN

## 2021-03-15 MED ORDER — OXYCODONE-ACETAMINOPHEN 5-325 MG PO TABS
1.0000 | ORAL_TABLET | ORAL | Status: DC | PRN
Start: 2021-03-15 — End: 2021-03-17

## 2021-03-15 MED ORDER — FENTANYL CITRATE (PF) 100 MCG/2ML IJ SOLN
50.0000 ug | INTRAMUSCULAR | Status: DC | PRN
  Administered 2021-03-16 – 2021-03-17 (×5): 100 ug via INTRAVENOUS
  Filled 2021-03-15 (×5): qty 2

## 2021-03-15 MED ORDER — LACTATED RINGERS IV SOLN: INTRAVENOUS | Status: DC

## 2021-03-15 MED ORDER — ONDANSETRON HCL 4 MG/2ML IJ SOLN
4.0000 mg | Freq: Four times a day (QID) | INTRAMUSCULAR | Status: DC | PRN
Start: 2021-03-15 — End: 2021-03-17
  Administered 2021-03-15 – 2021-03-17 (×3): 4 mg via INTRAVENOUS
  Filled 2021-03-15 (×4): qty 2

## 2021-03-15 MED ORDER — ACETAMINOPHEN 325 MG PO TABS: 650.0000 mg | ORAL_TABLET | ORAL | Status: DC | PRN

## 2021-03-15 MED ORDER — NIFEDIPINE ER OSMOTIC RELEASE 30 MG PO TB24
30.0000 mg | ORAL_TABLET | Freq: Every day | ORAL | Status: DC
  Administered 2021-03-16: 30 mg via ORAL
  Filled 2021-03-15 (×5): qty 1

## 2021-03-15 NOTE — Progress Notes (Signed)
Patient Vitals for the past 4 hrs:  BP Temp Temp src Pulse Resp  03/15/21 2222 (!) 144/75 -- -- 84 --  03/15/21 2110 (!) 144/78 -- -- 88 --  03/15/21 2053 (!) 167/83 -- -- 87 18  03/15/21 2011 135/62 98.3 F (36.8 C) Oral 95 18  'mild ctx q 3-6 m inutes.  FHR Cat 1.  Foley still in.  Will give 2nd cytotec bucally.

## 2021-03-15 NOTE — H&P (Signed)
OBSTETRIC ADMISSION HISTORY AND PHYSICAL  Brandy Brock is a 25 y.o. female G1P0000 with IUP at 53w0dby 13 week ultrasond presenting for IOL secondary to [place] She reports +FMs, No LOF, no VB, no blurry vision, headaches or peripheral edema, and RUQ pain.  She plans on breast feeding. She is undecided for birth control.  [facility] --->  Estimated Date of Delivery: 04/05/21  Sono:  '@[redacted]w[redacted]d' , CWD, normal anatomy, cephalic presentation, 29326Z 12% EFW  Prenatal History/Complications:  - cHTN (procardia 358mdaily in pregnancy) - syphilis in pregnancy (PCN #3 on 10/16/20) - low-lying placenta (resolved) - asthma (prn albuterol) - BMI 60  Past Medical History: Past Medical History:  Diagnosis Date  . Asthma    childhood  . Elevated liver function tests 12/11/2020   Elevated in MAU on 1/7 - repeat beginning of February   . Hypertension   . Pregnancy induced hypertension   . Syphilis affecting pregnancy   . Vaginal Pap smear, abnormal     Past Surgical History: Past Surgical History:  Procedure Laterality Date  . MULTIPLE EXTRACTIONS WITH ALVEOLOPLASTY N/A 10/11/2016   Procedure: MULTIPLE EXTRACTION WITH ALVEOLOPLASTY;  Surgeon: ScDiona BrownerDDS;  [place] [facility] [place];  Laterality: N/A;    Obstetrical History: OB History    Gravida  1   Para  0   Term  0   Preterm  0   AB  0   Living  0     SAB  0   IAB  0   Ectopic  0   Multiple  0   Live Births  0           Social History Social History   Socioeconomic History  . Marital status: Single    Spouse name: Not on file  . Number of children: Not on file  . Years of education: Not on file  . Highest education level: Not on file  Occupational History  . Not on file  Tobacco Use  . Smoking status: Never Smoker  . 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 Vaping Use: Never used  Substance and Sexual Activity  .  Alcohol use: No  . Drug use: No  . Sexual activity: Yes    Birth control/protection: None  Other Topics Concern  . Not on file  Social History Narrative  . Not on file   Social Determinants of Health   Financial Resource Strain: Not on file  Food Insecurity: No Food Insecurity  . Worried About RuCharity fundraisern the Last Year: Never true  . Ran Out of Food in the Last Year: Never true  Transportation Needs: No Transportation Needs  . Lack of Transportation (Medical): No  . Lack of Transportation (Non-Medical): No  Physical Activity: Not on file  Stress: Not on file  Social Connections: Not on file    Family History: Family History  Problem Relation Age of Onset  . Diabetes Father   . Diabetes Brother     Allergies: Allergies  Allergen Reactions  . Peanut-Containing Drug Products Itching and Swelling    REACTION: Facial itching and swelling.    Medications Prior to Admission  Medication Sig Dispense Refill Last Dose  . albuterol (VENTOLIN HFA) 108 (90 Base) MCG/ACT inhaler Inhale 2 puffs into the lungs every 6 (six) hours as needed for wheezing or shortness of breath. 8 g 2   .  aspirin EC 81 MG tablet Take 81 mg by mouth daily. Swallow whole.     . Blood Pressure Monitoring (BLOOD PRESSURE KIT) DEVI 1 Device by Does not apply route as needed. 1 each 0   . NIFEdipine (PROCARDIA-XL/NIFEDICAL-XL) 30 MG 24 hr tablet Take 1 tablet (30 mg total) by mouth daily. 45 tablet 1   . ondansetron (ZOFRAN ODT) 8 MG disintegrating tablet Take 1 tablet (8 mg total) by mouth every 8 (eight) hours as needed for nausea or vomiting. 20 tablet 1   . Prenatal Vit-Fe Fumarate-FA (PRENATAL VITAMIN) 27-0.8 MG TABS Take 1 tablet by mouth daily. 30 tablet 12      Review of Systems   All systems reviewed and negative except as stated in HPI  Last menstrual period 06/03/2020. General appearance: alert, cooperative and appears stated age Lungs: normal WOB Heart: regular rate Abdomen:  soft, non-tender Extremities: no sign of DVT Presentation: cephalic Fetal monitoringBaseline: 125 bpm, Variability: Good {> 6 bpm), Accelerations: Reactive and Decelerations: Absent Uterine activity: irregular     Prenatal labs: ABO, Rh: B/Positive/-- (11/15 0000) Antibody: Negative (11/15 0000) Rubella: Immune (11/15 0000) RPR: Reactive (03/03 0916)  HBsAg: Negative (11/15 0000)  HIV: Non Reactive (03/03 0916)  GBS: Negative/-- (04/28 1421)  2 hr Glucola wnl Genetic screening wnl Anatomy US wnl except for echogenic cardiac focus  Prenatal Transfer Tool  Maternal Diabetes: No Genetic Screening: Normal Maternal Ultrasounds/Referrals: Normal except for echogenic cardiac focus Fetal Ultrasounds or other Referrals:  None Maternal Substance Abuse:  No Significant Maternal Medications:  Procardia 20m daily Significant Maternal Lab Results: Group B Strep negative, maternal syphilis (treated in pregnancy)  No results found for this or any previous visit (from the past 24 hour(s)).  Patient Active Problem List   Diagnosis Date Noted  . Pediatric pre-birth visit for expectant parent 02/28/2021  . 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 Supervision of high risk pregnancy, antepartum 10/26/2020  . LGSIL on Pap smear of cervix 10/26/2020  . Syphilis affecting pregnancy   . Obesity in pregnancy, antepartum   . Chronic hypertension affecting pregnancy   . BMI 60.0-69.9, adult ([place] 10/13/2008  . Asthma 10/13/2008    Assessment/Plan:  Brandy HAYWORTHis a 25y.o. G1P0000 at 382w0dere [facility] #Labor: FB inserted and cytotec administered on admission. #Pain: TBD per pt request #FWB: Category 1 strip #ID: GBS negative #MOF: breast #MOC: undecided s/p counseling on admission #Circ: desired #cHTN: Procardia 3073maily in pregnancy (last dose in AM on 5/5). Blood pressure normal on admission. F/u preeclampsia labs on  admission. #Asthma: prn albuterol  GosRanda NgoD OB Fellow, Faculty Practice 03/15/2021 8:50 PM

## 2021-03-16 ENCOUNTER — Encounter (HOSPITAL_COMMUNITY): Payer: Self-pay | Admitting: Obstetrics and Gynecology

## 2021-03-16 LAB — CBC
HCT: 34.3 % — ABNORMAL LOW (ref 36.0–46.0)
Hemoglobin: 11.4 g/dL — ABNORMAL LOW (ref 12.0–15.0)
MCH: 29.5 pg (ref 26.0–34.0)
MCHC: 33.2 g/dL (ref 30.0–36.0)
MCV: 88.9 fL (ref 80.0–100.0)
Platelets: 186 10*3/uL (ref 150–400)
RBC: 3.86 MIL/uL — ABNORMAL LOW (ref 3.87–5.11)
RDW: 13.6 % (ref 11.5–15.5)
WBC: 14.1 10*3/uL — ABNORMAL HIGH (ref 4.0–10.5)
nRBC: 0 % (ref 0.0–0.2)

## 2021-03-16 LAB — RPR
RPR Ser Ql: REACTIVE — AB
RPR Titer: 1:4 {titer}

## 2021-03-16 MED ORDER — LACTATED RINGERS IV SOLN: 500.0000 mL | Freq: Once | INTRAVENOUS | Status: DC

## 2021-03-16 MED ORDER — TERBUTALINE SULFATE 1 MG/ML IJ SOLN: 0.2500 mg | Freq: Once | INTRAMUSCULAR | Status: DC | PRN

## 2021-03-16 MED ORDER — OXYTOCIN-SODIUM CHLORIDE 30-0.9 UT/500ML-% IV SOLN: 1.0000 m[IU]/min | INTRAVENOUS | Status: DC

## 2021-03-16 MED ORDER — PHENYLEPHRINE 40 MCG/ML (10ML) SYRINGE FOR IV PUSH (FOR BLOOD PRESSURE SUPPORT): 80.0000 ug | PREFILLED_SYRINGE | INTRAVENOUS | Status: DC | PRN

## 2021-03-16 MED ORDER — MISOPROSTOL 25 MCG QUARTER TABLET: 25.0000 ug | ORAL_TABLET | Freq: Once | ORAL | Status: DC

## 2021-03-16 MED ORDER — LABETALOL HCL 5 MG/ML IV SOLN
40.0000 mg | INTRAVENOUS | Status: DC | PRN
  Filled 2021-03-16 (×2): qty 8

## 2021-03-16 MED ORDER — MISOPROSTOL 25 MCG QUARTER TABLET
ORAL_TABLET | ORAL | Status: AC
  Administered 2021-03-16: 25 ug
  Filled 2021-03-16: qty 1

## 2021-03-16 MED ORDER — FENTANYL-BUPIVACAINE-NACL 0.5-0.125-0.9 MG/250ML-% EP SOLN
12.0000 mL/h | EPIDURAL | Status: DC | PRN
Start: 2021-03-16 — End: 2021-03-17

## 2021-03-16 MED ORDER — EPHEDRINE 5 MG/ML INJ: 10.0000 mg | INTRAVENOUS | Status: DC | PRN

## 2021-03-16 MED ORDER — LABETALOL HCL 5 MG/ML IV SOLN
80.0000 mg | INTRAVENOUS | Status: DC | PRN
Start: 2021-03-16 — End: 2021-03-21

## 2021-03-16 MED ORDER — MAGNESIUM SULFATE 40 GM/1000ML IV SOLN
2.0000 g/h | INTRAVENOUS | Status: DC
  Administered 2021-03-16 – 2021-03-17 (×3): 2 g/h via INTRAVENOUS
  Filled 2021-03-16 (×2): qty 1000

## 2021-03-16 MED ORDER — OXYTOCIN-SODIUM CHLORIDE 30-0.9 UT/500ML-% IV SOLN
1.0000 m[IU]/min | INTRAVENOUS | Status: DC
  Administered 2021-03-16: 4 m[IU]/min via INTRAVENOUS
  Administered 2021-03-16: 2 m[IU]/min via INTRAVENOUS
  Filled 2021-03-16: qty 500

## 2021-03-16 MED ORDER — MAGNESIUM SULFATE BOLUS VIA INFUSION
4.0000 g | Freq: Once | INTRAVENOUS | Status: AC
Start: 2021-03-16 — End: 2021-03-16
  Administered 2021-03-16: 4 g via INTRAVENOUS
  Filled 2021-03-16: qty 1000

## 2021-03-16 MED ORDER — LABETALOL HCL 5 MG/ML IV SOLN
20.0000 mg | INTRAVENOUS | Status: DC | PRN
  Administered 2021-03-16 – 2021-03-17 (×2): 20 mg via INTRAVENOUS
  Filled 2021-03-16 (×2): qty 4

## 2021-03-16 MED ORDER — DIPHENHYDRAMINE HCL 50 MG/ML IJ SOLN: 12.5000 mg | INTRAMUSCULAR | Status: DC | PRN

## 2021-03-16 NOTE — Progress Notes (Signed)
Labor Progress Note TENIKA KEERAN is a 25 y.o. G1P0000 at [redacted]w[redacted]d presented for [facility] w/si PEC  S:  Comfortable, no c/o. Not feeling ctx.  O:  BP 120/70   Pulse 88   Temp 98.8 F (37.1 C) (Oral)   Resp 18   Ht 5\' 7"  (1.702 m)   Wt (!) 180.2 kg   LMP 06/03/2020   BMI 62.22 kg/m  EFM: baseline 125 bpm/ mod variability/ + accels/ rare variable decels  Toco/IUPC: rare SVE: Dilation: 4.5 Effacement (%): 60 Cervical Position: Posterior Station: Ballotable Presentation: Vertex Exam by:: Quintus Premo,cnm   A/P: 25 y.o. G1P0000 [redacted]w[redacted]d  1. Labor: latent 2. FWB: Cat II 3. Pain: analgesia/anesthesia/NO prn 4. PEC: stable  Start Pitocin. Anticipate labor progress and SVD.  Julianne Handler, CNM 12:35 PM

## 2021-03-16 NOTE — Progress Notes (Signed)
Patient Vitals for the past 4 hrs:  BP Temp Temp src Pulse Resp  03/15/21 2352 (!) 167/102 -- -- 85 18  03/15/21 2348 (!) 165/65 -- -- 85 18  03/15/21 2332 (!) 165/91 -- -- 91 18  03/15/21 2222 (!) 144/75 -- -- 84 --  03/15/21 2110 (!) 144/78 -- -- 88 --  03/15/21 2053 (!) 167/83 -- -- 87 18  03/15/21 2011 135/62 98.3 F (36.8 C) Oral 95 18   Now meets criteria for SIPE w/severe features.  Denies HA/vision changes/RUQ pain.  Dr. Rip Harbour notified. Will start MgSO4 and initiate labetalol protocol

## 2021-03-16 NOTE — Progress Notes (Signed)
Labor Progress Note Brandy Brock is a 25 y.o. G1P0000 at [redacted]w[redacted]d presented for IOL for PEC  S:  Getting more uncomfortable with ctx.  O:  BP (!) 152/83   Pulse 85   Temp 98 F (36.7 C) (Oral)   Resp 18   Ht 5\' 7"  (1.702 m)   Wt (!) 180.2 kg   LMP 06/03/2020   BMI 62.22 kg/m  EFM: baseline 125 bpm/ mod variability/ + accels/ no decels  Toco/IUPC: 2-4 SVE: Dilation: 5.5 Effacement (%): 70 Cervical Position: Posterior Station: -1 Presentation: Vertex Exam by:: Kayo Zion,cnm Pitocin: 12 mu/min  A/P: 25 y.o. G1P0000 [redacted]w[redacted]d  1. Labor: early active 2. FWB: Cat I 3. Pain: analgesia/anesthesia/NO prn 4. CHTN w/si PEC: stable on Mg  Consented for AROM, AROM for small amt of clear fluid. Continue Pitocin. Anticipate labor progress and SVD.  Julianne Handler, CNM 5:47 PM

## 2021-03-16 NOTE — Progress Notes (Signed)
Labor Progress Note Brandy Brock is a 25 y.o. G1P0000 at [redacted]w[redacted]d presented for IOL for CHTN w/si PEC  S:  Comfortable, feeling occasional cramping.   O:  BP (!) 111/43   Pulse 89   Temp 98.8 F (37.1 C) (Oral)   Resp 18   Ht 5\' 7"  (1.702 m)   Wt (!) 180.2 kg   LMP 06/03/2020   BMI 62.22 kg/m  EFM: baseline 125 bpm/ mod variability/ + accels/ no decels  Toco/IUPC: rare SVE: deferred  A/P: 25 y.o. G1P0000 [redacted]w[redacted]d  1. Labor: latent 2. FWB: Cat I 3. Pain: analgesia/anesthesia prn 4. CHTN w/si PEC: stable on Mg  S/p FB. Continue Cytotec for ripening. Anticipate labor progress and SVD.  Julianne Handler, CNM 10:45 AM

## 2021-03-17 ENCOUNTER — Encounter (HOSPITAL_COMMUNITY): Payer: Self-pay | Admitting: Obstetrics and Gynecology

## 2021-03-17 ENCOUNTER — Inpatient Hospital Stay (HOSPITAL_COMMUNITY): Payer: Medicaid Other | Admitting: Anesthesiology

## 2021-03-17 ENCOUNTER — Encounter (HOSPITAL_COMMUNITY)
Admission: AD | Disposition: A | Discharge status: Home / Self Care | Payer: Self-pay | Source: Home / Self Care | Attending: Obstetrics and Gynecology

## 2021-03-17 DIAGNOSIS — O365930 Maternal care for other known or suspected poor fetal growth, third trimester, not applicable or unspecified: Secondary | ICD-10-CM

## 2021-03-17 DIAGNOSIS — Z3A37 37 weeks gestation of pregnancy: Secondary | ICD-10-CM

## 2021-03-17 DIAGNOSIS — O1092 Unspecified pre-existing hypertension complicating childbirth: Secondary | ICD-10-CM

## 2021-03-17 DIAGNOSIS — O114 Pre-existing hypertension with pre-eclampsia, complicating childbirth: Secondary | ICD-10-CM

## 2021-03-17 LAB — CBC
HCT: 35.7 % — ABNORMAL LOW (ref 36.0–46.0)
Hemoglobin: 11.8 g/dL — ABNORMAL LOW (ref 12.0–15.0)
MCH: 29.6 pg (ref 26.0–34.0)
MCHC: 33.1 g/dL (ref 30.0–36.0)
MCV: 89.7 fL (ref 80.0–100.0)
Platelets: 189 10*3/uL (ref 150–400)
RBC: 3.98 MIL/uL (ref 3.87–5.11)
RDW: 13.5 % (ref 11.5–15.5)
WBC: 25.4 10*3/uL — ABNORMAL HIGH (ref 4.0–10.5)
nRBC: 0 % (ref 0.0–0.2)

## 2021-03-17 LAB — COMPREHENSIVE METABOLIC PANEL
ALT: 16 U/L (ref 0–44)
AST: 18 U/L (ref 15–41)
Albumin: 2.7 g/dL — ABNORMAL LOW (ref 3.5–5.0)
Alkaline Phosphatase: 65 U/L (ref 38–126)
Anion gap: 6 (ref 5–15)
BUN: <5 mg/dL — ABNORMAL LOW (ref 6–20)
CO2: 24 mmol/L (ref 22–32)
Calcium: 8.2 mg/dL — ABNORMAL LOW (ref 8.9–10.3)
Chloride: 103 mmol/L (ref 98–111)
Creatinine, Ser: 0.66 mg/dL (ref 0.44–1.00)
GFR, Estimated: >60 mL/min (ref >60)
Glucose, Bld: 110 mg/dL — ABNORMAL HIGH (ref 70–99)
Potassium: 4.2 mmol/L (ref 3.5–5.1)
Sodium: 133 mmol/L — ABNORMAL LOW (ref 135–145)
Total Bilirubin: 0.5 mg/dL (ref 0.3–1.2)
Total Protein: 7 g/dL (ref 6.5–8.1)

## 2021-03-17 LAB — MAGNESIUM: Magnesium: 4.6 mg/dL — ABNORMAL HIGH (ref 1.7–2.4)

## 2021-03-17 SURGERY — Surgical Case
Anesthesia: Spinal

## 2021-03-17 MED ORDER — SODIUM CHLORIDE 0.9 % IV SOLN
INTRAVENOUS | Status: AC
  Filled 2021-03-17: qty 500

## 2021-03-17 MED ORDER — DIPHENHYDRAMINE HCL 25 MG PO CAPS: 25.0000 mg | ORAL_CAPSULE | ORAL | Status: DC | PRN

## 2021-03-17 MED ORDER — BUPIVACAINE IN DEXTROSE 0.75-8.25 % IT SOLN
INTRATHECAL | Status: DC | PRN
  Administered 2021-03-17: 1.65 mL via INTRATHECAL

## 2021-03-17 MED ORDER — ONDANSETRON HCL 4 MG/2ML IJ SOLN: 4.0000 mg | Freq: Three times a day (TID) | INTRAMUSCULAR | Status: DC | PRN

## 2021-03-17 MED ORDER — KETOROLAC TROMETHAMINE 30 MG/ML IJ SOLN
30.0000 mg | Freq: Four times a day (QID) | INTRAMUSCULAR | Status: AC | PRN
  Administered 2021-03-17 – 2021-03-18 (×4): 30 mg via INTRAVENOUS
  Filled 2021-03-17 (×3): qty 1

## 2021-03-17 MED ORDER — ACETAMINOPHEN 500 MG PO TABS
1000.0000 mg | ORAL_TABLET | Freq: Four times a day (QID) | ORAL | Status: DC | PRN
  Administered 2021-03-17 – 2021-03-20 (×7): 1000 mg via ORAL
  Filled 2021-03-17 (×6): qty 2

## 2021-03-17 MED ORDER — METHYLERGONOVINE MALEATE 0.2 MG/ML IJ SOLN
INTRAMUSCULAR | Status: AC
  Filled 2021-03-17: qty 1

## 2021-03-17 MED ORDER — KETOROLAC TROMETHAMINE 30 MG/ML IJ SOLN: 30.0000 mg | Freq: Four times a day (QID) | INTRAMUSCULAR | Status: AC | PRN

## 2021-03-17 MED ORDER — TRANEXAMIC ACID-NACL 1000-0.7 MG/100ML-% IV SOLN
INTRAVENOUS | Status: DC | PRN
  Administered 2021-03-17: 1000 mg via INTRAVENOUS

## 2021-03-17 MED ORDER — NALOXONE HCL 0.4 MG/ML IJ SOLN: 0.4000 mg | INTRAMUSCULAR | Status: DC | PRN

## 2021-03-17 MED ORDER — OXYTOCIN-SODIUM CHLORIDE 30-0.9 UT/500ML-% IV SOLN
INTRAVENOUS | Status: DC | PRN
  Administered 2021-03-17: 300 mL via INTRAVENOUS

## 2021-03-17 MED ORDER — ONDANSETRON HCL 4 MG/2ML IJ SOLN
INTRAMUSCULAR | Status: AC
  Filled 2021-03-17: qty 2

## 2021-03-17 MED ORDER — CEFAZOLIN IN SODIUM CHLORIDE 3-0.9 GM/100ML-% IV SOLN
3.0000 g | INTRAVENOUS | Status: AC
Start: 2021-03-17 — End: 2021-03-17
  Administered 2021-03-17: 3 g via INTRAVENOUS
  Filled 2021-03-17 (×2): qty 100

## 2021-03-17 MED ORDER — NALBUPHINE HCL 10 MG/ML IJ SOLN: 5.0000 mg | INTRAMUSCULAR | Status: DC | PRN

## 2021-03-17 MED ORDER — METHYLERGONOVINE MALEATE 0.2 MG/ML IJ SOLN
INTRAMUSCULAR | Status: DC | PRN
  Administered 2021-03-17: .2 mg via INTRAMUSCULAR

## 2021-03-17 MED ORDER — MORPHINE SULFATE (PF) 0.5 MG/ML IJ SOLN
INTRAMUSCULAR | Status: DC | PRN
  Administered 2021-03-17: .15 mg via INTRATHECAL

## 2021-03-17 MED ORDER — SODIUM CHLORIDE 0.9 % IV SOLN
500.0000 mg | Freq: Once | INTRAVENOUS | Status: AC
  Administered 2021-03-17: 500 mg via INTRAVENOUS
  Filled 2021-03-17: qty 500

## 2021-03-17 MED ORDER — DEXAMETHASONE SODIUM PHOSPHATE 10 MG/ML IJ SOLN
INTRAMUSCULAR | Status: AC
  Filled 2021-03-17: qty 1

## 2021-03-17 MED ORDER — MAGNESIUM SULFATE 40 GM/1000ML IV SOLN
INTRAVENOUS | Status: AC
  Filled 2021-03-17: qty 1000

## 2021-03-17 MED ORDER — FENTANYL CITRATE (PF) 100 MCG/2ML IJ SOLN
INTRAMUSCULAR | Status: AC
  Filled 2021-03-17: qty 2

## 2021-03-17 MED ORDER — KETOROLAC TROMETHAMINE 30 MG/ML IJ SOLN
INTRAMUSCULAR | Status: AC
  Filled 2021-03-17: qty 1

## 2021-03-17 MED ORDER — MAGNESIUM SULFATE 40 GM/1000ML IV SOLN: 2.0000 g/h | INTRAVENOUS | Status: AC

## 2021-03-17 MED ORDER — NALBUPHINE HCL 10 MG/ML IJ SOLN: 5.0000 mg | Freq: Once | INTRAMUSCULAR | Status: DC | PRN

## 2021-03-17 MED ORDER — PHENYLEPHRINE HCL (PRESSORS) 10 MG/ML IV SOLN
INTRAVENOUS | Status: DC | PRN
  Administered 2021-03-17 (×2): 120 ug via INTRAVENOUS

## 2021-03-17 MED ORDER — SOD CITRATE-CITRIC ACID 500-334 MG/5ML PO SOLN
30.0000 mL | ORAL | Status: AC
Start: 2021-03-17 — End: 2021-03-17
  Administered 2021-03-17: 30 mL via ORAL

## 2021-03-17 MED ORDER — DIPHENHYDRAMINE HCL 50 MG/ML IJ SOLN: 12.5000 mg | INTRAMUSCULAR | Status: DC | PRN

## 2021-03-17 MED ORDER — PHENYLEPHRINE HCL-NACL 20-0.9 MG/250ML-% IV SOLN
INTRAVENOUS | Status: AC
  Filled 2021-03-17: qty 250

## 2021-03-17 MED ORDER — OXYTOCIN-SODIUM CHLORIDE 30-0.9 UT/500ML-% IV SOLN
INTRAVENOUS | Status: AC
  Filled 2021-03-17: qty 500

## 2021-03-17 MED ORDER — PHENYLEPHRINE HCL-NACL 20-0.9 MG/250ML-% IV SOLN
INTRAVENOUS | Status: DC | PRN
  Administered 2021-03-17: 60 ug/min via INTRAVENOUS

## 2021-03-17 MED ORDER — NALOXONE HCL 4 MG/10ML IJ SOLN
1.0000 ug/kg/h | INTRAVENOUS | Status: DC | PRN
  Filled 2021-03-17: qty 5

## 2021-03-17 MED ORDER — MORPHINE SULFATE (PF) 0.5 MG/ML IJ SOLN
INTRAMUSCULAR | Status: AC
  Filled 2021-03-17: qty 10

## 2021-03-17 MED ORDER — DEXAMETHASONE SODIUM PHOSPHATE 10 MG/ML IJ SOLN
INTRAMUSCULAR | Status: DC | PRN
  Administered 2021-03-17: 10 mg via INTRAVENOUS

## 2021-03-17 MED ORDER — LACTATED RINGERS IV SOLN: INTRAVENOUS | Status: DC

## 2021-03-17 MED ORDER — CEFAZOLIN IN SODIUM CHLORIDE 3-0.9 GM/100ML-% IV SOLN
INTRAVENOUS | Status: AC
  Filled 2021-03-17: qty 100

## 2021-03-17 MED ORDER — ONDANSETRON HCL 4 MG/2ML IJ SOLN
INTRAMUSCULAR | Status: DC | PRN
  Administered 2021-03-17: 4 mg via INTRAVENOUS

## 2021-03-17 MED ORDER — SCOPOLAMINE 1 MG/3DAYS TD PT72
1.0000 | MEDICATED_PATCH | Freq: Once | TRANSDERMAL | Status: AC
  Administered 2021-03-17: 1.5 mg via TRANSDERMAL

## 2021-03-17 MED ORDER — SODIUM CHLORIDE 0.9% FLUSH: 3.0000 mL | INTRAVENOUS | Status: DC | PRN

## 2021-03-17 MED ORDER — SODIUM CHLORIDE 0.9 % IR SOLN
Status: DC | PRN
  Administered 2021-03-17: 1000 mL

## 2021-03-17 MED ORDER — FENTANYL CITRATE (PF) 100 MCG/2ML IJ SOLN
INTRAMUSCULAR | Status: DC | PRN
  Administered 2021-03-17: 15 ug via INTRATHECAL

## 2021-03-17 MED ORDER — PHENYLEPHRINE 40 MCG/ML (10ML) SYRINGE FOR IV PUSH (FOR BLOOD PRESSURE SUPPORT)
PREFILLED_SYRINGE | INTRAVENOUS | Status: AC
  Filled 2021-03-17: qty 10

## 2021-03-17 MED ORDER — SCOPOLAMINE 1 MG/3DAYS TD PT72
MEDICATED_PATCH | TRANSDERMAL | Status: DC | PRN
  Administered 2021-03-17: 1 via TRANSDERMAL

## 2021-03-17 MED ORDER — SCOPOLAMINE 1 MG/3DAYS TD PT72
MEDICATED_PATCH | TRANSDERMAL | Status: AC
  Filled 2021-03-17: qty 1

## 2021-03-17 MED ORDER — ACETAMINOPHEN 500 MG PO TABS
500.0000 mg | ORAL_TABLET | Freq: Four times a day (QID) | ORAL | Status: DC | PRN
  Filled 2021-03-17: qty 1

## 2021-03-17 MED ORDER — TRANEXAMIC ACID-NACL 1000-0.7 MG/100ML-% IV SOLN
INTRAVENOUS | Status: AC
  Filled 2021-03-17: qty 100

## 2021-03-17 SURGICAL SUPPLY — 48 items
ADH SKN CLS APL DERMABOND .7 (GAUZE/BANDAGES/DRESSINGS) ×2
ADH SKN CLS LQ APL DERMABOND (GAUZE/BANDAGES/DRESSINGS) ×1
CHLORAPREP W/TINT 26ML (MISCELLANEOUS) ×2 IMPLANT
CLAMP CORD UMBIL (MISCELLANEOUS) IMPLANT
CLOTH BEACON ORANGE TIMEOUT ST (SAFETY) ×2 IMPLANT
DERMABOND ADHESIVE PROPEN (GAUZE/BANDAGES/DRESSINGS) ×1
DERMABOND ADVANCED (GAUZE/BANDAGES/DRESSINGS) ×2
DERMABOND ADVANCED .7 DNX12 (GAUZE/BANDAGES/DRESSINGS) ×2 IMPLANT
DERMABOND ADVANCED .7 DNX6 (GAUZE/BANDAGES/DRESSINGS) ×1 IMPLANT
DRESSING PREVENA PLUS CUSTOM (GAUZE/BANDAGES/DRESSINGS) IMPLANT
DRSG OPSITE POSTOP 4X10 (GAUZE/BANDAGES/DRESSINGS) ×2 IMPLANT
DRSG PREVENA PLUS CUSTOM (GAUZE/BANDAGES/DRESSINGS) ×2
ELECT REM PT RETURN 9FT ADLT (ELECTROSURGICAL) ×2
ELECTRODE REM PT RTRN 9FT ADLT (ELECTROSURGICAL) ×1 IMPLANT
EXTENDER TRAXI PANNICULUS (MISCELLANEOUS) ×1 IMPLANT
EXTRACTOR VACUUM BELL STYLE (SUCTIONS) IMPLANT
GLOVE BIOGEL PI IND STRL 7.0 (GLOVE) ×1 IMPLANT
GLOVE BIOGEL PI IND STRL 8 (GLOVE) ×1 IMPLANT
GLOVE BIOGEL PI INDICATOR 7.0 (GLOVE) ×1
GLOVE BIOGEL PI INDICATOR 8 (GLOVE) ×1
GLOVE ECLIPSE 8.0 STRL XLNG CF (GLOVE) ×2 IMPLANT
GOWN STRL REUS W/TWL LRG LVL3 (GOWN DISPOSABLE) ×4 IMPLANT
KIT ABG SYR 3ML LUER SLIP (SYRINGE) ×2 IMPLANT
NDL HYPO 25X5/8 SAFETYGLIDE (NEEDLE) ×1 IMPLANT
NEEDLE HYPO 18GX1.5 BLUNT FILL (NEEDLE) ×2 IMPLANT
NEEDLE HYPO 22GX1.5 SAFETY (NEEDLE) ×2 IMPLANT
NEEDLE HYPO 25X5/8 SAFETYGLIDE (NEEDLE) ×2 IMPLANT
NS IRRIG 1000ML POUR BTL (IV SOLUTION) ×2 IMPLANT
PACK C SECTION WH (CUSTOM PROCEDURE TRAY) ×2 IMPLANT
PAD OB MATERNITY 4.3X12.25 (PERSONAL CARE ITEMS) ×2 IMPLANT
PENCIL SMOKE EVAC W/HOLSTER (ELECTROSURGICAL) ×2 IMPLANT
RETRACTOR TRAXI PANNICULUS (MISCELLANEOUS) IMPLANT
RTRCTR C-SECT PINK 25CM LRG (MISCELLANEOUS) IMPLANT
SUT CHROMIC 0 CT 1 (SUTURE) ×2 IMPLANT
SUT MNCRL 0 VIOLET CTX 36 (SUTURE) ×2 IMPLANT
SUT MONOCRYL 0 CTX 36 (SUTURE) ×4
SUT PLAIN 2 0 (SUTURE)
SUT PLAIN 2 0 XLH (SUTURE) IMPLANT
SUT PLAIN ABS 2-0 CT1 27XMFL (SUTURE) IMPLANT
SUT VIC AB 0 CTX 36 (SUTURE) ×2
SUT VIC AB 0 CTX36XBRD ANBCTRL (SUTURE) ×1 IMPLANT
SUT VIC AB 4-0 KS 27 (SUTURE) IMPLANT
SYR 20CC LL (SYRINGE) ×4 IMPLANT
TOWEL OR 17X24 6PK STRL BLUE (TOWEL DISPOSABLE) ×2 IMPLANT
TRAXI PANNICULUS EXTENDER (MISCELLANEOUS) ×1
TRAXI PANNICULUS RETRACTOR (MISCELLANEOUS) ×1
TRAY FOLEY W/BAG SLVR 14FR LF (SET/KITS/TRAYS/PACK) IMPLANT
WATER STERILE IRR 1000ML POUR (IV SOLUTION) ×2 IMPLANT

## 2021-03-17 NOTE — Discharge Summary (Signed)
Postpartum Discharge Summary     Patient Name: Brandy Brock DOB: 1996/07/26 MRN: [id number]  Date of admission: 03/15/2021 Delivery date:03/17/2021  Delivering provider: Florian Buff  Date of discharge: 03/20/2021  Admitting diagnosis: Chronic hypertension affecting pregnancy [O10.919] Intrauterine pregnancy: [redacted]w[redacted]d    Secondary diagnosis:  Active Problems:   BMI 60.0-69.9, adult ([place]   Supervision of high risk pregnancy, antepartum   Syphilis affecting pregnancy   Obesity in pregnancy, antepartum   Chronic hypertension affecting pregnancy   LGSIL on Pap smear of cervix   Substance abuse 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 yo G1P0000 at 318w2das admitted to the hospital 03/15/2021 for induction of labor. Patient had a labor course significant for IOL with cyto and FB. Patient was later started on pitocin and AROM performed. Patient had arrest of dilation at 7cm and decision was made to proceed with cesarean section. The patient went for cesarean section due to Arrest of Dilation. Delivery details are as follows: Membrane Rupture Time/Date: 5:46 PM ,03/16/2021   Delivery Method:C-Section, Low Transverse  Details of operation can be found in separate operative Note.  Patient with wound vac. S/p Magnesium x 24 hours. Resumed Procardia. BP in the 130-140/90 range at DC and Procardia increased. She is ambulating, tolerating a regular diet, passing flatus, and urinating well.  Patient is discharged home in stable condition on 03/20/21.      Newborn Data: Birth date:03/17/2021  Birth time:9:51  AM  Gender:Female  Living status:Living  Apgars:8 ,9  [phone]                                 Magnesium Sulfate received: Yes: Seizure prophylaxis BMZ received: No Rhophylac:N/A MMR:N/A T-DaP:Given prenatally Flu: Yes Transfusion:No  Physical exam  Vitals:   03/19/21 1550 03/19/21 2120 03/19/21 2318 03/20/21 0413  BP: 134/73 (!) 142/72 (!) 128/54 137/78  Pulse: 84 92 99 89  Resp: _0 Temp: 98.3 F (36.8 C) 98.4 F (36.9 C) 98.7 F (37.1 C) 97.7 F (36.5 C)  TempSrc: Oral Oral Oral Oral  SpO2: 99% 98% 100% 100%  Weight:      Height:       General: alert, cooperative and no distress Lochia: appropriate Uterine Fundus: firm Incision: Healing well with no significant drainage DVT Evaluation: No evidence of DVT seen on physical exam. Labs: Lab Results  Component Value Date   WBC 14.5 (H) 03/19/2021   HGB 10.2 (L) 03/19/2021   HCT 31.7 (L) 03/19/2021   MCV 91.4 03/19/2021   PLT 189 03/19/2021   CMP Latest Ref Rng & Units 03/19/2021  Glucose 70 - 99 mg/dL 85  BUN 6 - 20 mg/dL 11  Creatinine 0.44 - 1.00 mg/dL 0.72  Sodium 135 - 145 mmol/L 136  Potassium 3.5 - 5.1 mmol/L 3.6  Chloride 98 - 111 mmol/L 106  CO2 22 - 32 mmol/L 26  Calcium 8.9 - 10.3 mg/dL 8.5(L)  Total Protein 6.5 - 8.1 g/dL 6.1(L)  Total Bilirubin 0.3 - 1.2 mg/dL 0.3  Alkaline Phos 38 - 126 U/L 56  AST 15 - 41 U/L 23  ALT 0 - 44 U/L 18   Edinburgh Score: Edinburgh Postnatal Depression Scale Screening Tool 03/19/2021  I have been able to laugh and see the funny side of things. 0  I have looked forward with enjoyment to things. 0  I have blamed myself unnecessarily when things went wrong. 1  I have been anxious or worried for no good reason. 1  I have felt scared or panicky for no good reason. 0  Things have been getting on top of me. 0  I have been so unhappy that I have had difficulty sleeping. 0  I have felt sad or miserable. 0  I have been so unhappy that I have been crying. 0   The thought of harming myself has occurred to me. 0  Edinburgh Postnatal Depression Scale Total 2     After visit meds:  Allergies as of 03/20/2021      Reactions   Peanut-containing Drug Products Itching, Swelling   REACTION: Facial itching and swelling.      Medication List    TAKE these medications   albuterol 108 (90 Base) MCG/ACT inhaler Commonly known as: VENTOLIN HFA Inhale 2 puffs into the lungs every 6 (six) hours as needed for wheezing or shortness of breath.   aspirin EC 81 MG tablet Take 81 mg by mouth daily. Swallow whole.   Blood Pressure Kit Devi 1 Device by Does not apply route as needed.   NIFEdipine 60 MG 24 hr tablet Commonly known as: ADALAT CC Take 1 tablet (60 mg total) by mouth daily. What changed:   medication strength  how much to take   ondansetron 8 MG disintegrating tablet Commonly known as: Zofran ODT Take 1 tablet (8 mg total) by mouth every 8 (eight) hours as needed for nausea or vomiting.   oxyCODONE-acetaminophen 5-325 MG tablet Commonly known as: PERCOCET/ROXICET Take 1-2 tablets by mouth every 6 (six) hours as needed.   Prenatal Vitamin 27-0.8 MG Tabs Take 1 tablet by mouth daily.        Discharge home in stable condition Infant Feeding: Breast Infant Disposition:home with mother Discharge instruction: per After Visit Summary and Postpartum booklet. Activity: Advance as tolerated. Pelvic rest for 6 weeks.  Diet: routine diet Future Appointments:No future appointments. Follow up Visit:  Follow-up Information    [facility] Follow [facility].   Specialty: Obstetrics and Gynecology Contact information: [address] [place] [address] [phone]             Message sent to [facility] 03/17/21 by [employer].   Please schedule this patient for a In person postpartum visit in 6 weeks with the following provider: Any provider. Additional Postpartum  F/U:Incision check 1 week and BP check 1 week  High risk pregnancy complicated by: HTN Delivery mode:  C-Section, Low Transverse  Anticipated Birth Control:  Unsure   03/20/2021 Tanya S Pratt, MD   

## 2021-03-17 NOTE — Progress Notes (Signed)
OB Note  SVE by Nolene Ebbs shows patient with unchanged cervix at 7/80/0.  Fetus is category I with accels, tocometry quiet.   Patient was 6-7 at 2100 and 7 at 0300 on pitocin and with ruptured membranes and IUPC in place and pitocin up to 20 but unable to get her adequate. At 0530 patient decline continued with induction due to unchanged exam at 7 but we encouraged her to give it more time. SVE unchanged now and pt still desires c/s which I feel is reasonable. Risk of infection, bleeding d/w her.  OR busy due to stat c/s so okay to wait until room is turned over and ready. Will do 3g ancef with azithro  Placenta to pathology for syphilis in pregnancy  Durene Romans MD Attending Center for [employer] (Faculty Practice) 03/17/2021 Time: [phone]

## 2021-03-17 NOTE — Progress Notes (Signed)
MOB and FOB would like to use expressed breast milk and formula to supplement.

## 2021-03-17 NOTE — Progress Notes (Signed)
Reviewed safety sheet with MOB, educated, MOB and RN signed.  Spoke with MOB regarding supplementation until colostrum/milk comes in. Educated on donor breast milk and formula. MOB stated she would let RN know after she speaks with FOB and her mother what she would like to do. MOB wanted to pump, lactation had set up pump. Helped MOB initiate pumping session.

## 2021-03-17 NOTE — Transfer of Care (Signed)
Immediate Anesthesia Transfer of Care Note  Patient: Brandy Brock  Procedure(s) Performed: CESAREAN SECTION (N/A )  Patient Location: PACU  Anesthesia Type:Spinal  Level of Consciousness: awake, alert  and oriented  Airway & Oxygen Therapy: Patient Spontanous Breathing  Post-op Assessment: Report given to RN and Post -op Vital signs reviewed and stable  Post vital signs: Reviewed and stable  Last Vitals:  Vitals Value Taken Time  BP 144/78 03/17/21 1101  Temp    Pulse 95 03/17/21 1103  Resp 21 03/17/21 1103  SpO2 94 % 03/17/21 1103  Vitals shown include unvalidated device data.  Last Pain:  Vitals:   03/17/21 0830  TempSrc:   PainSc: 0-No pain         Complications: No complications documented.

## 2021-03-17 NOTE — Discharge Instructions (Signed)
-take tylenol 1000 mg every 6 hours as needed for pain, alternate with ibuprofen 600 mg every 6 hours -take oxycodone as needed if tylenol and ibuprofen aren't working -drink plenty of water to help with breastfeeding -continue prenatal vitamins while you are breastfeeding -take iron pills every other day with vitamin c, this will help healing as well as breast feeding -think about birth control options-->bedisider.org is a great website! You can get any form of birth control from the health department for free   Postpartum Care After Cesarean Delivery This sheet gives you information about how to care for yourself from the time you deliver your baby to up to 6-12 weeks after delivery (postpartum period). Your health care provider may also give you more specific instructions. If you have problems or questions, contact your health care provider. Follow these instructions at home: Medicines  Take over-the-counter and prescription medicines only as told by your health care provider.  If you were prescribed an antibiotic medicine, take it as told by your health care provider. Do not stop taking the antibiotic even if you start to feel better.  Ask your health care provider if the medicine prescribed to you: ? Requires you to avoid driving or using heavy machinery. ? Can cause constipation. You may need to take actions to prevent or treat constipation, such as:  Drink enough fluid to keep your urine pale yellow.  Take over-the-counter or prescription medicines.  Eat foods that are high in fiber, such as beans, whole grains, and fresh fruits and vegetables.  Limit foods that are high in fat and processed sugars, such as fried or sweet foods. Activity  Gradually return to your normal activities as told by your health care provider.  Avoid activities that take a lot of effort and energy (are strenuous) until approved by your health care provider. Walking at a slow to moderate pace is usually  safe. Ask your health care provider what activities are safe for you. ? Do not lift anything that is heavier than your baby or 10 lb (4.5 kg) as told by your health care provider. ? Do not vacuum, climb stairs, or drive a car for as long as told by your health care provider.  If possible, have someone help you at home until you are able to do your usual activities yourself.  Rest as much as possible. Try to rest or take naps while your baby is sleeping. Vaginal bleeding  It is normal to have vaginal bleeding (lochia) after delivery. Wear a sanitary pad to absorb vaginal bleeding and discharge. ? During the first week after delivery, the amount and appearance of lochia is often similar to a menstrual period. ? Over the next few weeks, it will gradually decrease to a dry, yellow-brown discharge. ? For most women, lochia stops completely by 4-6 weeks after delivery. Vaginal bleeding can vary from woman to woman.  Change your sanitary pads frequently. Watch for any changes in your flow, such as: ? A sudden increase in volume. ? A change in color. ? Large blood clots.  If you pass a blood clot, save it and call your health care provider to discuss. Do not flush blood clots down the toilet before you get instructions from your health care provider.  Do not use tampons or douches until your health care provider says this is safe.  If you are not breastfeeding, your period should return 6-8 weeks after delivery. If you are breastfeeding, your period may return anytime between 8   weeks after delivery and the time that you stop breastfeeding. Perineal care  If your C-section (Cesarean section) was unplanned, and you were allowed to labor and push before delivery, you may have pain, swelling, and discomfort of the tissue between your vaginal opening and your anus (perineum). You may also have an incision in the tissue (episiotomy) or the tissue may have torn during delivery. Follow these instructions  as told by your health care provider: ? Keep your perineum clean and dry as told by your health care provider. Use medicated pads and pain-relieving sprays and creams as directed. ? If you have an episiotomy or vaginal tear, check the area every day for signs of infection. Check for:  Redness, swelling, or pain.  Fluid or blood.  Warmth.  Pus or a bad smell. ? You may be given a squirt bottle to use instead of wiping to clean the perineum area after you go to the bathroom. As you start healing, you may use the squirt bottle before wiping yourself. Make sure to wipe gently. ? To relieve pain caused by an episiotomy, vaginal tear, or hemorrhoids, try taking a warm sitz bath 2-3 times a day. A sitz bath is a warm water bath that is taken while you are sitting down. The water should only come up to your hips and should cover your buttocks.   Breast care  Within the first few days after delivery, your breasts may feel heavy, full, and uncomfortable (breast engorgement). You may also have milk leaking from your breasts. Your health care provider can suggest ways to help relieve breast discomfort. Breast engorgement should go away within a few days.  If you are breastfeeding: ? Wear a bra that supports your breasts and fits you well. ? Keep your nipples clean and dry. Apply creams and ointments as told by your health care provider. ? You may need to use breast pads to absorb milk leakage. ? You may have uterine contractions every time you breastfeed for several weeks after delivery. Uterine contractions help your uterus return to its normal size. ? If you have any problems with breastfeeding, work with your health care provider or a lactation consultant.  If you are not breastfeeding: ? Avoid touching your breasts as this can make your breasts produce more milk. ? Wear a well-fitting bra and use cold packs to help with swelling. ? Do not squeeze out (express) milk. This causes you to make more  milk. Intimacy and sexuality  Ask your health care provider when you can engage in sexual activity. This may depend on your: ? Risk of infection. ? Healing rate. ? Comfort and desire to engage in sexual activity.  You are able to get pregnant after delivery, even if you have not had your period. If desired, talk with your health care provider about methods of family planning or birth control (contraception). Lifestyle  Do not use any products that contain nicotine or tobacco, such as cigarettes, e-cigarettes, and chewing tobacco. If you need help quitting, ask your health care provider.  Do not drink alcohol, especially if you are breastfeeding. Eating and drinking  Drink enough fluid to keep your urine pale yellow.  Eat high-fiber foods every day. These may help prevent or relieve constipation. High-fiber foods include: ? Whole grain cereals and breads. ? Brown rice. ? Beans. ? Fresh fruits and vegetables.  Take your prenatal vitamins until your postpartum checkup or until your health care provider tells you it is okay to stop.     General instructions  Keep all follow-up visits for you and your baby as told by your health care provider. Most women visit their health care provider for a postpartum checkup within the first 3-6 weeks after delivery. Contact a health care provider if you:  Feel unable to cope with the changes that a new baby brings to your life, and these feelings do not go away.  Feel unusually sad or worried.  Have breasts that are painful, hard, or turn red.  Have a fever.  Have trouble holding urine or keeping urine from leaking.  Have little or no interest in activities you used to enjoy.  Have not breastfed at all and you have not had a menstrual period for 12 weeks after delivery.  Have stopped breastfeeding and you have not had a menstrual period for 12 weeks after you stopped breastfeeding.  Have questions about caring for yourself or your  baby.  Pass a blood clot from your vagina. Get help right away if you:  Have chest pain.  Have difficulty breathing.  Have sudden, severe leg pain.  Have severe pain or cramping in your abdomen.  Bleed from your vagina so much that you fill more than one sanitary pad in one hour. Bleeding should not be heavier than your heaviest period.  Develop a severe headache.  Faint.  Have blurred vision or spots in your vision.  Have a bad-smelling vaginal discharge.  Have thoughts about hurting yourself or your baby. If you ever feel like you may hurt yourself or others, or have thoughts about taking your own life, get help right away. You can go to your nearest emergency department or call:  Your local emergency services (911 in the [place]  A suicide crisis helpline, such as the National Suicide Prevention Lifeline at 1-800-273-8255. This is open 24 hours a day. Summary  The period of time from when you deliver your baby to up to 6-12 weeks after delivery is called the postpartum period.  Gradually return to your normal activities as told by your health care provider.  Keep all follow-up visits for you and your baby as told by your health care provider. This information is not intended to replace advice given to you by your health care provider. Make sure you discuss any questions you have with your health care provider. Document Revised: 06/17/2018 Document Reviewed: 06/17/2018 Elsevier Patient Education  2021 [employer]  

## 2021-03-17 NOTE — Anesthesia Preprocedure Evaluation (Signed)
Anesthesia Evaluation  Patient identified by MRN, date of birth, ID band Patient awake    Reviewed: Allergy & Precautions, H&P , NPO status , Patient's Chart, lab work & pertinent test results, reviewed documented beta blocker date and time   Airway Mallampati: I  TM Distance: >3 FB Neck ROM: full    Dental no notable dental hx. (+) Teeth Intact, Dental Advisory Given   Pulmonary asthma ,    Pulmonary exam normal breath sounds clear to auscultation       Cardiovascular hypertension, negative cardio ROS Normal cardiovascular exam Rhythm:regular Rate:Normal     Neuro/Psych negative neurological ROS  negative psych ROS   GI/Hepatic negative GI ROS, Neg liver ROS,   Endo/Other  Morbid obesity  Renal/GU negative Renal ROS  negative genitourinary   Musculoskeletal   Abdominal   Peds  Hematology negative hematology ROS (+)   Anesthesia Other Findings   Reproductive/Obstetrics (+) Pregnancy                             Anesthesia Physical Anesthesia Plan  ASA: III and emergent  Anesthesia Plan: Spinal   Post-op Pain Management:    Induction:   PONV Risk Score and Plan: 2 and Treatment may vary due to age or medical condition  Airway Management Planned: Natural Airway  Additional Equipment:   Intra-op Plan:   Post-operative Plan:   Informed Consent: I have reviewed the patients History and Physical, chart, labs and discussed the procedure including the risks, benefits and alternatives for the proposed anesthesia with the patient or authorized representative who has indicated his/her understanding and acceptance.       Plan Discussed with: Anesthesiologist and CRNA  Anesthesia Plan Comments:         Anesthesia Quick Evaluation

## 2021-03-17 NOTE — Anesthesia Procedure Notes (Addendum)
Spinal  Patient location during procedure: OR Start time: 03/17/2021 9:15 AM End time: 03/17/2021 9:20 AM Reason for block: surgical anesthesia Staffing Anesthesiologist: Janeece Riggers, MD Preanesthetic Checklist Completed: patient identified, IV checked, site marked, risks and benefits discussed, surgical consent, monitors and equipment checked, pre-op evaluation and timeout performed Spinal Block Patient position: sitting Prep: DuraPrep Patient monitoring: heart rate, cardiac monitor, continuous pulse ox and blood pressure Approach: midline Location: L4-5 Injection technique: single-shot Needle Needle type: Whitacre  Needle gauge: 25 G Needle length: 15 cm Assessment Sensory level: T4 Events: paresthesia and CSF return Additional Notes Difficult spinal placement secondary to redundant tissue.  Changed lever and used 22 whit   +CSF / DOSE / +CSF ASP

## 2021-03-17 NOTE — Progress Notes (Signed)
Patient ID: Brandy Brock, female   DOB: 1996-05-28, 25 y.o.   MRN: [id number]  Breathing w ctx, good support system, doing well; mag sulfate infusing  BPs 148/81, 133/87 FHR 120s, +accels, occ variables Ctx irreg and difficult to trace- q 2-4 mins with Pit at 35mu/min Cx 6+/80/vtx -1  IUP@37 .1wks cHTN w SIPE SGA IOL process  Continue to uptitrate Pit for labor progress May need to consider internal monitors for tracing purposes Anticipate vag del  [name] 03/16/2021

## 2021-03-17 NOTE — Op Note (Signed)
Cesarean Section Procedure Note   MUNIRAH DOERNER  03/15/2021 - 03/17/2021  Indications: Failed induction of labor, arrest of dilation   Pre-operative Diagnosis: Unscheduled Cesarean Section, Failure to progresss, arrest of dilitation.   Post-operative Diagnosis: Same   Surgeon: Surgeon(s) and Role:    Arrie Senate, MD - Assisting    * Florian Buff, MD   Anesthesia: epidural    Estimated Blood Loss: 379mL  Total IV Fluids: 1073ml   Urine Output: 100CC OF clear urine  Specimens: placenta to pathology  Findings: Uterus with contraction band noted. Posterior 1x1cm fibroid. Normal fallopian tubes and ovaries bilaterally.  Baby condition / location:  Couplet care / Skin to Skin 2634 gm APGAR: 8, 9   Complications: no complications  Indications: Brandy Brock is a 25 y.o. G1P0000 with an IUP [redacted]w[redacted]d presenting for induction of labor with chronic hypertension with severe pre-eclampsia. Patient failed induction with arrest of dilation at 7cm and decision was made to proceed with cesarean section.  The risks, benefits, complications, treatment options, and expected outcomes were discussed with the patient . The patient concurred with the proposed plan, giving informed consent. identified as Francee Piccolo and the procedure verified as C-Section Delivery.  Procedure Details: A Time Out was held and the above information confirmed.  The patient was taken back to the operative suite where epidural anesthesia was dosed up to appropriate level.  After induction of anesthesia, the patient was draped and prepped in the usual sterile manner and placed in a dorsal supine position with a leftward tilt. A transverse incision was made and carried down through the subcutaneous tissue to the fascia. Fascial incision was made and extended transversely. The fascia was separated from the underlying rectus tissue bluntly. The peritoneum was identified and entered. Peritoneal incision was  extended longitudinally. A contraction band was noted on the uterus, and a low transverse uterine incision was made above the contraction band. Anterior placenta was noted. Infant delivered from cephalic presentation was a 2634 gram Living newborn infant(s) with Apgar scores of 8 at one minute and 9 at five minutes. Cord ph was sent the umbilical cord was clamped and cut cord blood was obtained for evaluation. The placenta was removed Intact and appeared normal. The uterine outline, tubes and ovaries appeared abnormal - posterior 1x1cm fibroid noted on uterus, contraction band noted}. The uterine incision was closed with running locked sutures of 0-monocryl and an imbricating layer was also placed.   Hemostasis was observed. The fascia was then reapproximated with running sutures of 0-monocryl. The subcuticular closure was performed using chromic gut. The skin was closed with 4-0Vicryl.   Instrument, sponge, and needle counts were correct prior the abdominal closure and were correct at the conclusion of the case.     Disposition: PACU - hemodynamically stable.   Maternal Condition: stable       Signed: Naaman Plummer 05/15/1699 10:31 AM

## 2021-03-17 NOTE — Lactation Note (Signed)
This note was copied from a baby's chart. Lactation Consultation Note  Patient Name: Brandy Brock [id number] Date: 03/17/2021 Reason for consult: Initial assessment;Primapara;1st time breastfeeding;Early term 37-38.6wks;Infant < 6lbs Age:25 hours   LC in to visit with P1 Mom of [redacted]w[redacted]d infant weighing 5 lbs 12oz.  First CBG 31 and baby was given 10 ml of 22 cal formula by bottle.  Baby temperature low and sleeping STS on Mom with a warm blanket.   Reviewed breast massage and hand expression.  Mom falling asleep as she is on MgSO4 and states she isn't feeling well.  Mom will need more education at a later time due to her sleepiness.  Bay set up DEBP and sized Mom with 27 mm flanges.  Mom with large diameter nipples that invert at center of nipples.  With hand expression, glistening of colostrum noted.  Mom has large and heavy breasts that splay to side.  Luis Lopez set up washing and drying bin and RN will assist family in this when Mom is ready to begin pumping.  Mom informed of importance of starting as soon as she can to support a full milk supply.    Plan- 1- Offer breast with cues, keeping baby STS as much as possible 2- Supplement with 5-10 ml or more EBM+/donor milk/ formula after breastfeeding at least every 3 hrs.  Volume to be increased daily per LPTI guideline (provided to Mom) 3- Pump both breasts on initiation setting 4- ask for help prn.  Lactation brochure and LPTI guidelines provided.  FOB and GMOB aware to ask for assistance prn.   25m"e" ternal Data Has patient been taught Hand Expression?: Yes Does the patient have breastfeeding experience prior to this delivery?: No  Feeding Mother's Current Feeding Choice: Breast Milk Nipple Type: Slow - flow   Lactation Tools Discussed/Used Tools: Pump;Flanges Flange Size: 27 Breast pump type: Double-Electric Breast Pump Pump Education: Setup, frequency, and cleaning;Milk Storage Reason for Pumping: Support milk supply/infant <6 lbs at [redacted]w[redacted]d  gestation  Discharge [facility] Program: Yes  Consult Status 25nsult Status: Follow-up Date: 03/18/21 Follow-up type: In-patient    Broadus John 03/17/2021, 2:03 PM

## 2021-03-17 NOTE — Plan of Care (Signed)

## 2021-03-17 NOTE — Anesthesia Postprocedure Evaluation (Signed)
Anesthesia Post Note  Patient: Brandy Brock  Procedure(s) Performed: CESAREAN SECTION (N/A )     Patient location during evaluation: PACU Anesthesia Type: Spinal Level of consciousness: oriented and awake and alert Pain management: pain level controlled Vital Signs Assessment: post-procedure vital signs reviewed and stable Respiratory status: spontaneous breathing, respiratory function stable and patient connected to nasal cannula oxygen Cardiovascular status: blood pressure returned to baseline and stable Postop Assessment: no headache, no backache and no apparent nausea or vomiting Anesthetic complications: no   No complications documented.  Last Vitals:  Vitals:   03/17/21 1356 03/17/21 1424  BP:    Pulse:    Resp:  17  Temp:    SpO2: 94% 95%    Last Pain:  Vitals:   03/17/21 1444  TempSrc:   PainSc: 4                  Arvis Miguez

## 2021-03-17 NOTE — Progress Notes (Signed)
Patient ID: Brandy Brock, female   DOB: [birth year], 25 y.o.   MRN: [id number]  Still doing well coping w labor  BP 140/81 P 88 FHR 130s, +accels, variables Ctx 2-3 with Pit at 69mu/min Cx 6+/90/vtx -1  IUP@37 .1wks cHTN w SIPE IOL process  IFSE/IUPC placed for more accurate monitoring Will eval MVUs after 30 or so mins to determine Pitocin need Hopeful for vag del  [name] [facility] 03/16/2021

## 2021-03-17 NOTE — Progress Notes (Signed)
Patient ID: Brandy Brock, female   DOB: 08-16-1996, 25 y.o.   MRN: [id number]  Had minimal cx change overnight despite adequate ctx and increasing Pitocin requirement; pt and family were really hopeful for vag del  BPs 137/83, after IV Lab 20 mg when BP was 163/93 FHR 120-130s, +accels, has had episodes of variables, usually early Ctx have become increasing irreg despite increasing the Pit from 14>20 Cx 7/80/0 (sl more puffy than earlier)  IUP@37 .2wks cHTN w SIPE Failure to dilate  Discussion re lack of cervical change despite initial adequate ctx when IUPC placed, followed by periods of irreg pattern with increased Pitocin. Per Dr Ilda Basset, with stable baby and nonurgent situation, availability of C/S won't be until ~0645, so may continue pressing forward if pt desires. She prefers to stop the Pitocin and try to rest with IV pain meds as she doesn't have an epidural placed. Also offered epidural placement and continued Pitocin to see if progress ensues, and she declines. Will stop Pit, give Fentanyl, and plan for C/S when able as long as mom/baby remain stable.  [name] CNM 03/17/2021

## 2021-03-18 MED ORDER — GABAPENTIN 600 MG PO TABS
300.0000 mg | ORAL_TABLET | Freq: Two times a day (BID) | ORAL | Status: DC
  Filled 2021-03-18 (×2): qty 0.5

## 2021-03-18 MED ORDER — SENNOSIDES-DOCUSATE SODIUM 8.6-50 MG PO TABS
2.0000 | ORAL_TABLET | Freq: Every day | ORAL | Status: DC
  Administered 2021-03-18 – 2021-03-19 (×2): 2 via ORAL
  Filled 2021-03-18 (×2): qty 2

## 2021-03-18 MED ORDER — POLYETHYLENE GLYCOL 3350 17 G PO PACK: 17.0000 g | PACK | Freq: Every day | ORAL | Status: DC

## 2021-03-18 MED ORDER — POLYETHYLENE GLYCOL 3350 17 G PO PACK
17.0000 g | PACK | Freq: Every day | ORAL | Status: DC | PRN
  Administered 2021-03-19 (×2): 17 g via ORAL
  Filled 2021-03-18 (×2): qty 1

## 2021-03-18 MED ORDER — IBUPROFEN 800 MG PO TABS
800.0000 mg | ORAL_TABLET | Freq: Three times a day (TID) | ORAL | Status: DC
  Administered 2021-03-18 – 2021-03-20 (×7): 800 mg via ORAL
  Filled 2021-03-18 (×7): qty 1

## 2021-03-18 MED ORDER — OXYCODONE HCL 5 MG PO TABS
10.0000 mg | ORAL_TABLET | ORAL | Status: DC | PRN
  Administered 2021-03-20: 10 mg via ORAL
  Filled 2021-03-18 (×2): qty 2

## 2021-03-18 MED ORDER — GABAPENTIN 300 MG PO CAPS
300.0000 mg | ORAL_CAPSULE | Freq: Two times a day (BID) | ORAL | Status: DC
  Administered 2021-03-18 – 2021-03-20 (×5): 300 mg via ORAL
  Filled 2021-03-18 (×5): qty 1

## 2021-03-18 MED ORDER — OXYCODONE HCL 5 MG PO TABS
5.0000 mg | ORAL_TABLET | ORAL | Status: DC | PRN
Start: 2021-03-18 — End: 2021-03-21
  Administered 2021-03-18 – 2021-03-20 (×6): 5 mg via ORAL
  Filled 2021-03-18 (×5): qty 1

## 2021-03-18 NOTE — Progress Notes (Signed)
Subjective: POD#1 pLTCS-arrest of dilation  Patient is doing well without complaints. Ambulating without difficulty. Passing flatus. Tolerating PO. Abdominal pain improved. Vaginal bleeding decreased. Foley catheter still in place. Denies headaches, vision changes, chest pain, sob, LE edema.  Objective: Vital signs in last 24 hours: Temp:  [97.8 F (36.6 C)-98.8 F (37.1 C)] 97.8 F (36.6 C) (05/08 0756) Pulse Rate:  [77-102] 87 (05/08 0757) Resp:  [16-23] 17 (05/08 0757) BP: (97-144)/(43-93) 107/43 (05/08 0757) SpO2:  [92 %-100 %] 97 % (05/08 0756)  Physical Exam:  General: alert, cooperative and no distress Lochia: appropriate Uterine Fundus: firm Incision: wound vac in place DVT Evaluation: No evidence of DVT seen on physical exam.  Recent Labs    03/16/21 1707 03/17/21 1431  HGB 11.4* 11.8*  HCT 34.3* 35.7*    Assessment/Plan: POD#1 pLTCS-arrest of dilation  -doing well, VSS  -foley catheter in place, plan for removal today, await void  -bottle feeding  -counseled on contraception, still undecided, referred to [email or web address]  -desires circ for baby, consented  #cHTN w/ severe SIPE  -Mg to be discontinued @1000   -BP stable, not on anti-htn meds  -asymptomatic  #THC in pregnancy: SW consult  Plan for discharge tomorrow if BP remains stable and meeting pp milestones.  Arrie Senate 06/11/2750, 8:48 AM

## 2021-03-18 NOTE — Lactation Note (Signed)
This note was copied from a baby's chart. Lactation Consultation Note LC to room for f/u visit with p1 mother and infant under 21 hours of age. Mother is pumping and offering EBM to infant. Mother is providing sts care but infant is not latching yet. Bf'ing is mother's plan. We reviewed normalcy of infant's behaviors at this time. Mother to continue current feeding poc of pumping and providing formula supplementation prn. She is aware of LC and community resources. Will plan to f/u prn to provide additional support.   Patient Name: Brandy Brock [id number] Date: 03/18/2021 Reason for consult: Follow-up assessment;Early term 37-38.6wks Age:25 hours  Maternal Data  Mother has large diameter, bifurcated nipples.   Feeding Mother's Current Feeding Choice: Breast Milk and Formula Nipple Type: Slow - flow   Interventions: Education;Breast feeding basics reviewed;Skin to skin;Expressed milk;Hand express;DEBP   Consult Status Consult Status: Follow-up Follow-up type: [place], [place] IBCLC 03/18/2021, 8:55 AM

## 2021-03-18 NOTE — Clinical Social Work Maternal (Signed)
CLINICAL SOCIAL WORK MATERNAL/CHILD NOTE  Patient Details  Name: Brandy Brock MRN: [id number] Date of Birth: 1996-01-16  Date:  03/18/2021  Clinical Social Worker Initiating Note:  Glenard Haring Boyd-Gilyard Date/Time: Initiated:  03/18/21/1241     Child's Name:  Brandy Or.   Biological Parents:  Mother,Father   Need for Interpreter:  None   Reason for Referral:  Current Substance Use/Substance Use During Pregnancy  (MOB had a positive UDS for THC on 11/17/20)   Address:  [place] [address]    Phone number:  [phone] (home)     Additional phone number: FOB's number is [phone]  Household Members/Support Persons (HM/SP):   Household Member/Support Person 1   HM/SP Name Relationship DOB or Age  HM/SP -1 Curtis Mayfield Sr. 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   Education:  [place] arranged:    Museum/gallery curator Resources:  Medicaid   Other Resources:  Physicist, medical ,[place] Considerations Which May Impact Care:  Per [employer], MOB is Engineer, manufacturing.   Strengths:  Ability to meet basic needs ,Pediatrician chosen,Home prepared for child    Psychotropic Medications:         Pediatrician:    [place] area  Pediatrician List:   [place]      Pediatrician Fax Number:    Risk Factors/Current Problems:  Substance Use    Cognitive State:  Alert ,Insightful ,Goal Oriented ,Able to Concentrate ,Linear Thinking    Mood/Affect:  Happy ,Interested ,Comfortable ,Relaxed    CSW Assessment: CSW  met with MOB to complete an assessment for a consult for hx of THC use in pregnancy.  MOB was polite and was receptive with meeting with CSW.  When CSW arrived, MOB was bonding with infant as evident by MOB engaging in skin to skin.  FOB was also present and with MOB's permission CSW asked FOB to leave in order to meet with MOB in private. FOB expressed frustration about being asked to leave and CSW provided FOB with information about HIPAA and respecting MOB's confidentiality; FOB left without incident.  CSW inquired about MOB's substance use, and MOB reported utilizing marijuana prior to pregnancy and stopping after receiving pregnancy confirmation. CSW made MOB aware of a positive UDS for MOB on 11/17/20.  MOB reported, "We went to a family gathering around that time and I was not aware that some of the deserts had marijuana in them."  CSW informed MOB of the hospital's drug screen policy. MOB was understanding and did not have any concerns.  CSW shared with MOB that CSW will monitor infant's CDS and will make a report to [facility] CPS if warranted (CDS was not collected to order placed after 24 hours of life for infant).  CSW offered MOB resources  and referrals for substance interventions and MOB declined.  MOB did not have any questions about the hospital's policy and reported having all essential items for infant.    CSW Plan/Description:  No Further Intervention Required/No Barriers to Discharge,Sudden Infant Death Syndrome (SIDS) Education,Perinatal Mood and Anxiety Disorder (PMADs) Education,Other Patient/Family Education,Hospital Drug Screen Policy Information,Other Information/Referral to [employer] Will Continue to Monitor Umbilical Cord Tissue Drug Screen Results and Make Report if Warranted   Laurey Arrow, MSW, LCSW Clinical Social Work [phone]  Dimple Nanas, LCSW 03/18/2021, 12:47 PM

## 2021-03-19 LAB — COMPREHENSIVE METABOLIC PANEL
ALT: 18 U/L (ref 0–44)
AST: 23 U/L (ref 15–41)
Albumin: 2.4 g/dL — ABNORMAL LOW (ref 3.5–5.0)
Alkaline Phosphatase: 56 U/L (ref 38–126)
Anion gap: 4 — ABNORMAL LOW (ref 5–15)
BUN: 11 mg/dL (ref 6–20)
CO2: 26 mmol/L (ref 22–32)
Calcium: 8.5 mg/dL — ABNORMAL LOW (ref 8.9–10.3)
Chloride: 106 mmol/L (ref 98–111)
Creatinine, Ser: 0.72 mg/dL (ref 0.44–1.00)
GFR, Estimated: >60 mL/min (ref >60)
Glucose, Bld: 85 mg/dL (ref 70–99)
Potassium: 3.6 mmol/L (ref 3.5–5.1)
Sodium: 136 mmol/L (ref 135–145)
Total Bilirubin: 0.3 mg/dL (ref 0.3–1.2)
Total Protein: 6.1 g/dL — ABNORMAL LOW (ref 6.5–8.1)

## 2021-03-19 LAB — CBC
HCT: 31.7 % — ABNORMAL LOW (ref 36.0–46.0)
Hemoglobin: 10.2 g/dL — ABNORMAL LOW (ref 12.0–15.0)
MCH: 29.4 pg (ref 26.0–34.0)
MCHC: 32.2 g/dL (ref 30.0–36.0)
MCV: 91.4 fL (ref 80.0–100.0)
Platelets: 189 10*3/uL (ref 150–400)
RBC: 3.47 MIL/uL — ABNORMAL LOW (ref 3.87–5.11)
RDW: 13.9 % (ref 11.5–15.5)
WBC: 14.5 10*3/uL — ABNORMAL HIGH (ref 4.0–10.5)
nRBC: 0 % (ref 0.0–0.2)

## 2021-03-19 LAB — T.PALLIDUM AB, TOTAL: T Pallidum Abs: REACTIVE — AB

## 2021-03-19 MED ORDER — POTASSIUM CHLORIDE CRYS ER 20 MEQ PO TBCR
20.0000 meq | EXTENDED_RELEASE_TABLET | Freq: Two times a day (BID) | ORAL | Status: DC
  Administered 2021-03-19 – 2021-03-20 (×3): 20 meq via ORAL
  Filled 2021-03-19 (×3): qty 1

## 2021-03-19 MED ORDER — FUROSEMIDE 40 MG PO TABS
20.0000 mg | ORAL_TABLET | Freq: Every day | ORAL | Status: DC
  Administered 2021-03-19 – 2021-03-20 (×2): 20 mg via ORAL
  Filled 2021-03-19 (×2): qty 1

## 2021-03-19 MED ORDER — NIFEDIPINE ER OSMOTIC RELEASE 30 MG PO TB24
30.0000 mg | ORAL_TABLET | Freq: Every day | ORAL | Status: DC
  Administered 2021-03-19 – 2021-03-20 (×2): 30 mg via ORAL
  Filled 2021-03-19 (×2): qty 1

## 2021-03-19 NOTE — Progress Notes (Signed)
POD#2 pLTCS-arrest of dilation at [redacted]w[redacted]d  Subjective: Patient is doing well without complaints. Ambulating without difficulty. Passing flatus. Tolerating PO. Abdominal pain improved. Vaginal bleeding decreased. Patient denies any headaches, visual symptoms, RUQ/epigastric pain or other concerning symptoms.  Objective: Vital signs in last 24 hours: Temp:  [97.8 F (36.6 C)-98.3 F (36.8 C)] 97.9 F (36.6 C) (05/09 0533) Pulse Rate:  [68-93] 68 (05/09 0533) Resp:  [16-18] 18 (05/09 0533) BP: (107-138)/(43-62) 133/61 (05/09 0533) SpO2:  [96 %-98 %] 98 % (05/09 0533)  Patient Vitals for the past 24 hrs:  BP Temp Temp src Pulse Resp SpO2  03/19/21 0533 133/61 97.9 F (36.6 C) Oral 68 18 98 %  03/18/21 2012 (!) 138/57 98.2 F (36.8 C) Oral 93 16 --  03/18/21 1631 (!) 114/57 98.2 F (36.8 C) Axillary 86 16 96 %  03/18/21 1212 107/62 98.3 F (36.8 C) Oral 81 18 98 %  03/18/21 0935 -- -- -- -- 16 --  03/18/21 0830 -- -- -- -- 17 --  03/18/21 0757 (!) 107/43 -- -- 87 17 --  03/18/21 0756 -- 97.8 F (36.6 C) Oral -- -- 97 %    Physical Exam:  General: alert, cooperative and no distress Lochia: appropriate Uterine Fundus: firm Incision: wound vac in place DVT Evaluation:  2+ BLE edmea, symmetric. No cords, calf tenderness.  Recent Labs    03/16/21 1707 03/17/21 1431  HGB 11.4* 11.8*  HCT 34.3* 35.7*    Assessment/Plan: POD#1 pLTCS-arrest of dilation  -doing well, VSS  -bottle feeding  -counseled on contraception, still undecided, referred to [email or web address]  -baby doing well at bedside, s/p circ yesterday  #cHTN w/ severe SIPE  -Procardia XL 30 mg po daily started today, also will start Lasix for edema   -Monitor BP, currently asymptomatic  -Hopefully discharge tomorrow if stable  #THC in pregnancy: SW consult  Plan for discharge tomorrow if BP remains stable and meeting pp milestones.  Verita Schneiders, MD 03/19/2021, 7:50 AM

## 2021-03-19 NOTE — Lactation Note (Signed)
This note was copied from a baby's chart. Lactation Consultation Note  Patient Name: Brandy Brock [id number] Date: 03/19/2021 Reason for consult: Follow-up assessment;1st time breastfeeding;Primapara;Early term 37-38.6wks;Infant weight loss;Other (Comment) (1 % weight loss/ Per [place] - see mom later and give her awhile due to a migrane headache. baby recently fed.) Age:25 hours  LC reviewed the doc flow sheets noted  Mom has been pumping with 8- 70ml EBM yield, otherwise receiving formula with the  Greatest volume 28 ml.    Maternal Data    Feeding Mother's Current Feeding Choice: Breast Milk and Formula Nipple Type: Slow - flow  Last feeding noted on doc flow sheets was 0905 22 cal Neosure 10 ml .  Prior to that feeding was 28 ml at 0515   [facility] Score                    Lactation Tools Discussed/Used    Interventions    Discharge    Consult Status Consult Status: Follow-up Date: 03/19/21 Follow-up type: In-patient    Ethel 03/19/2021, 10:40 AM

## 2021-03-19 NOTE — Lactation Note (Signed)
This note was copied from Brandy baby's chart. Lactation Consultation Note  Patient Name: Brandy Brock [id number] Date: 03/19/2021 Reason for consult: Follow-up assessment;Primapara;1st time breastfeeding;Early term 37-38.6wks Age:25 hours  Follow up visit to 68 hours old LPT infant with 0.99% weight loss at the time of visit. Mother is getting ready to pump upon arrival. Mother states she pumped once yesterday and collected ~94mL of EBM. LC observed lots of breast and nipple edema. Urged mother to use shells and fitted 30-mm flanges to use for 24h until edema improves. Reviewed pumping basics/frequency/cleaning and storage of EBM.  Discussed infant's feeding volume according to age 59-60 mL 8-12 times in 24h.Talked about hunger cues and the impact of pacifiers.   Mother completes pumping with initiation setting and collects 25-mL of EBM. LC reviewed pace bottle-feeding technique, upright position and frequent burping. Reinforced using EBM first and then use formula to get to desired volume.   Feeding plan:  1. Feed EBM following age guidelines 8 - 12 times in 24h period 2. Wear shells 3. Pump for supplementation and stimulation  4. Supplement with formula following guidelines, pace bottle feeding, frequent burping and, upright position. 5. Encouraged maternal rest, hydration and food intake.  6. Contact Lactation Services or local resources for support, questions or concerns.    All questions answered at this time.   Feeding Mother's Current Feeding Choice: Breast Milk and Formula Nipple Type: Slow - flow  Lactation Tools Discussed/Used Tools: Shells;Flanges;Coconut oil;Pump Flange Size: 30;27 Breast pump type: Double-Electric Breast Pump Pump Education: Milk Storage;Setup, frequency, and cleaning Reason for Pumping: stimulation and supplementation Pumping frequency: Q3 Pumped volume: 25 mL  Interventions Interventions: Breast feeding basics reviewed;Skin to skin;Breast  massage;DEBP;Shells;Coconut oil;Expressed milk;Education  Discharge    Consult Status Consult Status: Follow-up Date: 03/20/21 Follow-up type: In-patient    Brandy Brock Brandy Brock 03/19/2021, 2:23 PM

## 2021-03-20 ENCOUNTER — Other Ambulatory Visit (HOSPITAL_COMMUNITY): Payer: Self-pay

## 2021-03-20 MED ORDER — OXYCODONE-ACETAMINOPHEN 5-325 MG PO TABS: 1.0000 | ORAL_TABLET | Freq: Four times a day (QID) | ORAL | 0 refills | Status: DC | PRN

## 2021-03-20 MED ORDER — NIFEDIPINE ER 60 MG PO TB24
60.0000 mg | ORAL_TABLET | Freq: Every day | ORAL | 1 refills | Status: DC
  Filled 2021-03-20: qty 60, 60d supply, fill #0

## 2021-03-20 MED ORDER — OXYCODONE-ACETAMINOPHEN 5-325 MG PO TABS
1.0000 | ORAL_TABLET | Freq: Four times a day (QID) | ORAL | 0 refills | Status: DC | PRN
  Filled 2021-03-20: qty 20, 3d supply, fill #0

## 2021-03-20 NOTE — Lactation Note (Signed)
This note was copied from a baby's chart. Lactation Consultation Note LC to room for f/u visit. Mother and infant may d/c today. Mother continues to pump and bottle feed. She endorses +breast changes with average pumping yield of 19mls. She denies engorgement today. We reviewed importance of frequent pumping until baby is able to latch and bf. Mom plans to f/u with [facility] today p d/c for loaner pump. She is aware of [facility] services here and at [facility]. Infant has +weight gain today and output is wnl.   Patient Name: Brandy Brock [id number] Date: 03/20/2021 Reason for consult: Follow-up assessment;Early term 37-38.6wks Age:25 hours  Feeding Mother's Current Feeding Choice: Breast Milk and Formula   Discharge Discharge Education: Engorgement and breast care;Outpatient recommendation;Warning signs for feeding baby  Consult Status Consult Status: Follow-up Follow-up type: [place], [place] 03/20/2021, 8:59 AM

## 2021-03-21 LAB — SURGICAL PATHOLOGY

## 2021-03-23 ENCOUNTER — Encounter: Payer: Self-pay | Admitting: *Deleted

## 2021-03-23 ENCOUNTER — Other Ambulatory Visit: Payer: Self-pay

## 2021-03-23 ENCOUNTER — Ambulatory Visit (INDEPENDENT_AMBULATORY_CARE_PROVIDER_SITE_OTHER): Payer: Medicaid Other | Admitting: *Deleted

## 2021-03-23 VITALS — BP 147/95 | HR 97 | Ht 67.0 in | Wt 392.5 lb

## 2021-03-23 DIAGNOSIS — Z0130 Encounter for examination of blood pressure without abnormal findings: Secondary | ICD-10-CM

## 2021-03-23 DIAGNOSIS — Z9189 Other specified personal risk factors, not elsewhere classified: Secondary | ICD-10-CM

## 2021-03-23 NOTE — Progress Notes (Signed)
Pt presents for BP check following C/S delivery on 5/7.  BP - 147/95. She reports getting H/A since increase of Procardia to 60 mg. She states she did not have H/A previously when taking 30 mg dose. Consult with Earlie Server who advises pt may split her dose of Procardia and take 30 mg twice daily instead of daily dose of 60 mg. Pt was advised. Pt will return on 5/16 as scheduled for wound vac removal, incision check and BP check. Pt also reports that she is pumping for breast milk because her baby is not latching well. I offered appt with lactation consultant in office and she accepted.    Chart reviewed for nurse visit. Agree with plan of care.   Virginia Rochester, NP 03/23/2021 10:54 AM

## 2021-03-26 ENCOUNTER — Other Ambulatory Visit: Payer: Self-pay

## 2021-03-26 ENCOUNTER — Ambulatory Visit (INDEPENDENT_AMBULATORY_CARE_PROVIDER_SITE_OTHER): Payer: Medicaid Other

## 2021-03-26 ENCOUNTER — Ambulatory Visit: Payer: Medicaid Other

## 2021-03-26 DIAGNOSIS — Z0130 Encounter for examination of blood pressure without abnormal findings: Secondary | ICD-10-CM

## 2021-03-26 NOTE — Progress Notes (Signed)
Pt here today for incision check since C-section delivery on 03/17/21 and wound vac removal. Pt states started having light bleeding through wound vac last night after bird bath bending over.   Incision- active 2cm  bleeding near middle of incision. Placed 4x4 guaze on incision. Will get provider to see incision. Advised pt to remove wound vac glue with rubbing alcohol. Pt agreeable and verbalized understanding.   Dr Rip Harbour in to see pt since wound vac removal. Wound assessed and placed wound packing into incision, shown to pt's mother who will do daily changes at home. Supplies given. Pt advised ok to shower. Pt to return in 1 week with Dr Rip Harbour for wound check. Pt has appt on 04/02/21 at 140 pm. Pt verbalized understanding and agreeable to date and time of appt.   BP- 138/97   Pt states taking 30mg  in morning and 30 mg at night of nifedipine. Pt states did have headaches when taking 60mg  once a day, but not since split dose. Denies headaches today, blurry vision or edema. Pt advised to continue to take until PP visit on 05/04/21.    Colletta Maryland, RN  03/26/21

## 2021-03-26 NOTE — Progress Notes (Signed)
Pt seen and examined. 1 cm central opening, tracked 1 cm x 6 cm to left and 9-10 cm to right. Old dark bleed noted. No evidence of infection. Wound packed with 1 inch plain gauze. Pt's mother taught wound care and repacking. Pt tolerated well. Will repack qd and reevaluate in 1 week.

## 2021-04-01 ENCOUNTER — Other Ambulatory Visit: Payer: Self-pay | Admitting: Obstetrics and Gynecology

## 2021-04-02 ENCOUNTER — Other Ambulatory Visit: Payer: Self-pay

## 2021-04-02 ENCOUNTER — Encounter: Payer: Self-pay | Admitting: Obstetrics and Gynecology

## 2021-04-02 ENCOUNTER — Ambulatory Visit (INDEPENDENT_AMBULATORY_CARE_PROVIDER_SITE_OTHER): Payer: Medicaid Other | Admitting: Obstetrics and Gynecology

## 2021-04-02 NOTE — Progress Notes (Signed)
Ms Arroyo presents for follow up form last weeks nurse visit. Incisional hematoma Dx at that time Packed and mother instructed on packing qd.  Pt concerned about possible infection today. Some incisional drainage with odor. Denies fever or chills.   PE AF VSS Incision opening as noted before, packing removed, no frank pus or evidence of infection noted. Wound culture obtained. Irrigated with NS. Again no evidence of infection noted. Wound noted to be smaller from last visit. Right side  @ 5 cm Left side @ 9 cm Repacked with gauzed.  A/P Wound complication following c section  Will await culture results before starting any antibiotics. Continue with qd wound packing. Instructed may increased to BID if need F/U in 1 week

## 2021-04-03 ENCOUNTER — Other Ambulatory Visit: Payer: Self-pay | Admitting: Lactation Services

## 2021-04-03 MED ORDER — NIFEDIPINE ER 30 MG PO TB24: 30.0000 mg | ORAL_TABLET | Freq: Two times a day (BID) | ORAL | 1 refills | Status: DC

## 2021-04-03 NOTE — Progress Notes (Signed)
Patient requesting refill for 30 mg Nifedipine to take BID. Per chart review on 5/13, patient was informed she can take 30 mg BID since she had HA when taking 60 mg daily. Refill placed.

## 2021-04-07 LAB — ANAEROBIC AND AEROBIC CULTURE

## 2021-04-11 ENCOUNTER — Other Ambulatory Visit: Payer: Self-pay

## 2021-04-11 ENCOUNTER — Ambulatory Visit (INDEPENDENT_AMBULATORY_CARE_PROVIDER_SITE_OTHER): Payer: Medicaid Other | Admitting: Family Medicine

## 2021-04-11 MED ORDER — CEFADROXIL 500 MG PO CAPS: 500.0000 mg | ORAL_CAPSULE | Freq: Two times a day (BID) | ORAL | 0 refills | Status: DC

## 2021-04-11 NOTE — Progress Notes (Signed)
389lbs  BP 144/ 87 HR: 98

## 2021-04-11 NOTE — Progress Notes (Signed)
   Subjective:    Patient ID: Brandy Brock, female    DOB: 29-Dec-1995, 25 y.o.   MRN: [id number]  HPI Patient seen for wound check. Having some purulent drainage with odor, but states that it's improving overall.   Review of Systems     Objective:   Physical Exam Vitals reviewed.  Constitutional:      Appearance: Normal appearance.  Skin:    General: Skin is warm and dry.     Comments: Open wound from cesarean with 1.5cm opening just left of midline. Small amount of erythema, but overall good granulation tissue visible. Defect under the skin extends 1.5cm to the right and 5cm to the left.  Neurological:     Mental Status: She is alert.       Assessment & Plan:  1. Postoperative complication of cesarean section Will cover with antibiotics for infection, but overall improving. F/u next week.

## 2021-04-19 ENCOUNTER — Ambulatory Visit (INDEPENDENT_AMBULATORY_CARE_PROVIDER_SITE_OTHER): Payer: Medicaid Other

## 2021-04-19 ENCOUNTER — Other Ambulatory Visit: Payer: Self-pay

## 2021-04-19 VITALS — BP 140/89 | HR 92 | Wt 395.0 lb

## 2021-04-19 DIAGNOSIS — Z5941 Food insecurity: Secondary | ICD-10-CM

## 2021-04-19 DIAGNOSIS — Z4889 Encounter for other specified surgical aftercare: Secondary | ICD-10-CM

## 2021-04-19 NOTE — Progress Notes (Signed)
Pt here today for wound check. Pt states no drainage, odor or pain. Wound is being packed every day and improving every day.   Incision is clean and dry.   Dr Ilda Basset in to see pt today. Pt advised to do wound changes every 1-2 days and have follow up in 10-14 days. Pt has PP visit and wound checked scheduled on 6/20 with Dr Rip Harbour. Pt verbalized understanding and agreeable to plan of care.   Wound supplies given.   Colletta Maryland, Therapist, sports.

## 2021-04-27 NOTE — Progress Notes (Signed)
Patient was assessed and managed by nursing staff during this encounter. I have reviewed the chart and agree with the documentation and plan. I have also made any necessary editorial changes. Healing well with Healthy granulation tissue with area on left side of incision approximately 4-5cm in length and 2cm deep and wide. There is some tracking on the left edge so I advised her to make sure she uses a q tip there first to pack and then do the rest of the incision.   Aletha Halim, MD 04/27/2021 3:08 PM

## 2021-04-30 ENCOUNTER — Other Ambulatory Visit: Payer: Self-pay

## 2021-04-30 ENCOUNTER — Encounter: Payer: Self-pay | Admitting: Family Medicine

## 2021-04-30 ENCOUNTER — Encounter: Payer: Self-pay | Admitting: Obstetrics and Gynecology

## 2021-04-30 ENCOUNTER — Ambulatory Visit (INDEPENDENT_AMBULATORY_CARE_PROVIDER_SITE_OTHER): Payer: Medicaid Other | Admitting: Obstetrics and Gynecology

## 2021-04-30 DIAGNOSIS — Z98891 History of uterine scar from previous surgery: Secondary | ICD-10-CM

## 2021-04-30 HISTORY — DX: History of uterine scar from previous surgery: Z98.891

## 2021-04-30 NOTE — Progress Notes (Signed)
    [facility] Partum Visit Note  Brandy Brock is a 25 y.o. G90P1001 female who presents for a postpartum visit. She is 6 weeks postpartum following a primary cesarean section.  I have fully reviewed the prenatal and intrapartum course. The delivery was at 37/2 gestational weeks.  Anesthesia: epidural. Postpartum course has been complicated by c section wound hematoma. Baby is doing well. Baby is feeding by both breast and bottle - [place] Good Start. Bleeding no bleeding. Bowel function is normal. Bladder function is normal. Patient is not sexually active. Contraception method is none. Postpartum depression screening: negative.   The pregnancy intention screening data noted above was reviewed. Potential methods of contraception were discussed. The patient elected to proceed with IUD or IUS.     Health Maintenance Due  Topic Date Due   HPV VACCINES (1 - 2-dose series) Never done    Medical record  Review of Systems Pertinent items noted in HPI and remainder of comprehensive ROS otherwise negative.  Objective:  There were no vitals taken for this visit.   General:  alert   Breasts:  not indicated  Lungs: clear to auscultation bilaterally  Heart:  regular rate and rhythm, S1, S2 normal, no murmur, click, rub or gallop  Abdomen: soft, non-tender; bowel sounds normal; no masses,  no organomegaly   Wound Healing well, small area of separation @ 5 cm x 0.5 cm, with good granulation tissue, no evidence of infection  GU exam:  not indicated       Assessment:    There are no diagnoses linked to this encounter.  NL postpartum exam.  GHTN Wound check Plan:   Essential components of care per ACOG recommendations:  1.  Mood and well being: Patient with negative depression screening today. Reviewed local resources for support.  - Patient tobacco use? No.   - hx of drug use? No.    2. Infant care and feeding:  -Patient currently breastmilk feeding? Yes -Social determinants of health  (SDOH) reviewed in EPIC. No concerns  3. Sexuality, contraception and birth spacing - Patient does not want a pregnancy in the next year.  Desired family size is 1 children.  - Reviewed forms of contraception in tiered fashion. Patient desires IUD but declined placement today. Information provided and appt for placement in 1-2 weeks as per pt request - Discussed birth spacing of 18 months  4. Sleep and fatigue -Encouraged family/partner/community support of 4 hrs of uninterrupted sleep to help with mood and fatigue  5. Physical Recovery  - Discussed patients delivery and complications. She describes her labor as good. - Patient had a C-section.  - Patient has urinary incontinence? No. - Patient is safe to resume physical and sexual activity  6.  Health Maintenance - HM due items addressed Yes - Last pap smear No results found for: DIAGPAP Pap smear not done at today's visit.  -Breast Cancer screening indicated? No.   7. Chronic Disease/Pregnancy Condition follow up: None  - PCP follow up  Arlina Robes, [place] for [facility], [place]

## 2021-04-30 NOTE — Patient Instructions (Signed)
IUD PLACEMENT POST-PROCEDURE INSTRUCTIONS  You may take Ibuprofen, Aleve or Tylenol for pain if needed.  Cramping should resolve within in 24 hours.  You may have a small amount of spotting.  You should wear a mini pad for the next few days.  You may have intercourse after 24 hours.  If you using this for birth control, it is effective immediately.  You need to call if you have any pelvic pain, fever, heavy bleeding or foul smelling vaginal discharge.  Irregular bleeding is common the first several months after having an IUD placed. You do not need to call for this reason unless you are concerned.  Shower or bathe as normal  You should have a follow-up appointment in 4-8 weeks for a re-check to make sure you are not having any problems.Intrauterine Device Information An intrauterine device (IUD) is a medical device that is inserted into the uterus to prevent pregnancy. It is a small, T-shaped device that has one or two nylon strings hanging down from it. The strings hang out of the lower part of the uterus (cervix) to allow for future IUD removal. There are two types of IUDs: Hormone IUD. This type of IUD is made of plastic and contains the hormone progestin (synthetic progesterone). A hormone IUD may last 3-5 years. Copper IUD. This type of IUD has copper wire wrapped around it. A copper IUD may last up to 10 years. How is an IUD inserted? An IUD is inserted through the vagina, through the cervix, and into the uterus with a minor medical procedure. The procedure for IUD insertion may vary amonghealth care providers and hospitals. How does an IUD work? Synthetic progesterone in a hormonal IUD prevents pregnancy by: Thickening cervical mucus to prevent sperm from entering the uterus. Thinning the uterine lining to prevent a fertilized egg from being implanted there. Copper in a copper IUD prevents pregnancy by making the uterus and fallopiantubes produce a fluid that kills sperm. What are the  advantages of an IUD? Advantages of either type of IUD An IUD: Is highly effective in preventing pregnancy. Is reversible. You can become pregnant shortly after the IUD is removed. Is low-maintenance and can stay in place for a long time. Has no estrogen-related side effects. Can be used when breastfeeding. Is not associated with weight gain. Can be inserted right after childbirth, an abortion, or a miscarriage. Advantages of a hormone IUD If it is inserted within 7 days of your period starting, it works right after it has been inserted. If the hormone IUD is inserted at any other time in your cycle, you will need to use a backup method of birth control for 7 days after insertion. It can make menstrual periods lighter or stop completely. It can reduce menstrual cramping and other discomforts from menstrual periods. It can be used for 3-5 years, depending on which IUD you have. Advantages of a copper IUD It works right after it is inserted. It can be used as a form of emergency birth control if it is inserted within 5 days after having unprotected sex. It does not interfere with your body's natural hormones. It can be used for up to 10 years. What are the disadvantages of an IUD? An IUD may cause irregular menstrual bleeding for a period of time after insertion. It is common to have pain during insertion and have cramping and vaginal bleeding after insertion. An IUD may cut the uterus (uterine perforation) when it is inserted. This is rare. Pelvic inflammatory  disease (PID) may happen after insertion of an IUD. PID is an infection in the uterus and fallopian tubes. The IUD does not cause the infection. The infection is usually from an unknown sexually transmitted infection (STI). This is rare, and it usually happens during the first 20 days after the IUD is inserted. A copper IUD can make your menstrual flow heavier and more painful. IUDs cannot prevent sexually transmitted infections  (STIs). How is an IUD removed?  You will lie on your back with your knees bent and your feet in footrests (stirrups). A device will be inserted into your vagina to spread apart the vaginal walls (speculum). This will allow your health care provider to see the strings attached to the IUD. Your health care provider will use a small instrument (forceps) to grasp the IUD strings and will pull firmly until the IUD is removed. You may have some discomfort when the IUD is removed. Your health care provider may recommend taking over-the-counter pain relievers, such as ibuprofen, before the procedure. You may also have minor spotting for a few days after theprocedure. The procedure for IUD removal may vary among health care providers andhospitals. Is an IUD right for me? If you are interested in an IUD, discuss it with your health care provider. He or she will make sure you are a good candidate for an IUD and will let you know more about the advantages, disadvantage, and possible side effects. This willallow you to make a decision about the device. Summary An intrauterine device (IUD) is a medical device that is inserted in the uterus to prevent pregnancy. It is a small, T-shaped device that has one or two nylon strings hanging down from it. A hormone IUD contains the hormone progestin (synthetic progesterone). A copper IUD has copper wire wrapped around it. Synthetic progesterone in a hormone IUD prevents pregnancy by thickening cervical mucus and thinning the walls of the uterus. Copper in a copper IUD prevents pregnancy by making the uterus and fallopian tubes produce a fluid that kills sperm. A hormone IUD can be left in place for 3-5 years. A copper IUD can be left in place for up to 10 years. An IUD is inserted and removed by a health care provider. You may feel some pain during insertion and removal. Your health care provider may recommend taking over-the-counter pain medicine, such as ibuprofen, before  an IUD procedure. This information is not intended to replace advice given to you by your health care provider. Make sure you discuss any questions you have with your healthcare provider. Document Revised: 05/10/2020 Document Reviewed: 05/10/2020 Elsevier Patient Education  [place] Maintenance, Female Adopting a healthy lifestyle and getting preventive care are important in promoting health and wellness. Ask your health care provider about: The right schedule for you to have regular tests and exams. Things you can do on your own to prevent diseases and keep yourself healthy. What should I know about diet, weight, and exercise? Eat a healthy diet  Eat a diet that includes plenty of vegetables, fruits, low-fat dairy products, and lean protein. Do not eat a lot of foods that are high in solid fats, added sugars, or sodium.  Maintain a healthy weight Body mass index (BMI) is used to identify weight problems. It estimates body fat based on height and weight. Your health care provider can help determineyour BMI and help you achieve or maintain a healthy weight. Get regular exercise Get regular exercise. This is one of the  most important things you can do for your health. Most adults should: Exercise for at least 150 minutes each week. The exercise should increase your heart rate and make you sweat (moderate-intensity exercise). Do strengthening exercises at least twice a week. This is in addition to the moderate-intensity exercise. Spend less time sitting. Even light physical activity can be beneficial. Watch cholesterol and blood lipids Have your blood tested for lipids and cholesterol at 25 years of age, then havethis test every 5 years. Have your cholesterol levels checked more often if: Your lipid or cholesterol levels are high. You are older than 25 years of age. You are at high risk for heart disease. What should I know about cancer screening? Depending on your health  history and family history, you may need to have cancer screening at various ages. This may include screening for: Breast cancer. Cervical cancer. Colorectal cancer. Skin cancer. Lung cancer. What should I know about heart disease, diabetes, and high blood pressure? Blood pressure and heart disease High blood pressure causes heart disease and increases the risk of stroke. This is more likely to develop in people who have high blood pressure readings, are of African descent, or are overweight. Have your blood pressure checked: Every 3-5 years if you are 61-31 years of age. Every year if you are 90 years old or older. Diabetes Have regular diabetes screenings. This checks your fasting blood sugar level. Have the screening done: Once every three years after age 9 if you are at a normal weight and have a low risk for diabetes. More often and at a younger age if you are overweight or have a high risk for diabetes. What should I know about preventing infection? Hepatitis B If you have a higher risk for hepatitis B, you should be screened for this virus. Talk with your health care provider to find out if you are at risk forhepatitis B infection. Hepatitis C Testing is recommended for: Everyone born from [birth year] through 1965. Anyone with known risk factors for hepatitis C. Sexually transmitted infections (STIs) Get screened for STIs, including gonorrhea and chlamydia, if: You are sexually active and are younger than 25 years of age. You are older than 25 years of age and your health care provider tells you that you are at risk for this type of infection. Your sexual activity has changed since you were last screened, and you are at increased risk for chlamydia or gonorrhea. Ask your health care provider if you are at risk. Ask your health care provider about whether you are at high risk for HIV. Your health care provider may recommend a prescription medicine to help prevent HIV infection. If you  choose to take medicine to prevent HIV, you should first get tested for HIV. You should then be tested every 3 months for as long as you are taking the medicine. Pregnancy If you are about to stop having your period (premenopausal) and you may become pregnant, seek counseling before you get pregnant. Take 400 to 800 micrograms (mcg) of folic acid every day if you become pregnant. Ask for birth control (contraception) if you want to prevent pregnancy. Osteoporosis and menopause Osteoporosis is a disease in which the bones lose minerals and strength with aging. This can result in bone fractures. If you are 72 years old or older, or if you are at risk for osteoporosis and fractures, ask your health care provider if you should: Be screened for bone loss. Take a calcium or vitamin D supplement to  lower your risk of fractures. Be given hormone replacement therapy (HRT) to treat symptoms of menopause. Follow these instructions at home: Lifestyle Do not use any products that contain nicotine or tobacco, such as cigarettes, e-cigarettes, and chewing tobacco. If you need help quitting, ask your health care provider. Do not use street drugs. Do not share needles. Ask your health care provider for help if you need support or information about quitting drugs. Alcohol use Do not drink alcohol if: Your health care provider tells you not to drink. You are pregnant, may be pregnant, or are planning to become pregnant. If you drink alcohol: Limit how much you use to 0-1 drink a day. Limit intake if you are breastfeeding. Be aware of how much alcohol is in your drink. In the [place] one drink equals one 12 oz bottle of beer (355 mL), one 5 oz glass of wine (148 mL), or one 1 oz glass of hard liquor (44 mL). General instructions Schedule regular health, dental, and eye exams. Stay current with your vaccines. Tell your health care provider if: You often feel depressed. You have ever been abused or do not feel  safe at home. Summary Adopting a healthy lifestyle and getting preventive care are important in promoting health and wellness. Follow your health care provider's instructions about healthy diet, exercising, and getting tested or screened for diseases. Follow your health care provider's instructions on monitoring your cholesterol and blood pressure. This information is not intended to replace advice given to you by your health care provider. Make sure you discuss any questions you have with your healthcare provider. Document Revised: 10/21/2018 Document Reviewed: 10/21/2018 Elsevier Patient Education  2022 [employer].

## 2021-04-30 NOTE — Progress Notes (Signed)
Pt. Is undecided on Birth Control.

## 2021-05-04 ENCOUNTER — Ambulatory Visit: Payer: Medicaid Other | Admitting: Medical

## 2021-05-21 ENCOUNTER — Ambulatory Visit: Payer: Medicaid Other | Admitting: Obstetrics and Gynecology

## 2021-06-18 ENCOUNTER — Other Ambulatory Visit: Payer: Self-pay

## 2021-06-18 ENCOUNTER — Ambulatory Visit (INDEPENDENT_AMBULATORY_CARE_PROVIDER_SITE_OTHER): Payer: Medicaid Other | Admitting: Obstetrics and Gynecology

## 2021-06-18 ENCOUNTER — Encounter: Payer: Self-pay | Admitting: Obstetrics and Gynecology

## 2021-06-18 DIAGNOSIS — Z30011 Encounter for initial prescription of contraceptive pills: Secondary | ICD-10-CM

## 2021-06-18 DIAGNOSIS — Z309 Encounter for contraceptive management, unspecified: Secondary | ICD-10-CM | POA: Insufficient documentation

## 2021-06-18 MED ORDER — NORETHINDRONE 0.35 MG PO TABS
1.0000 | ORAL_TABLET | Freq: Every day | ORAL | 11 refills | Status: DC
Start: 2021-06-18 — End: 2022-04-07

## 2021-06-18 NOTE — Progress Notes (Signed)
Pt here to start OCP's.  Breast feeding  PE AF VSS Lungs clear Heart RRR Abd soft + BS  A/P Contraception Management  Will start POP. U/R/Back up method reviewed with pt. F/U in 1 yr or PRN

## 2021-07-27 NOTE — Progress Notes (Unsigned)
Patient walked into front office with concern of passing out this AM. Front office reported to RN and requested RN speak with patient. Spoke with patient and partner. Partner reports that patient was sitting on toilet this AM and stopped responding to him, he had to hold her up so she would not fall off toilet. Pt reports one episode of vomiting this AM; reports this looked like "stomach acid." Pt is currently on birth control pills; currently experiencing menstrual period. Pt states she went to ED but after being told the wait time, chose to leave. Pt is alert and oriented at this time. Denies feelings of dizziness or shortness of breath. Reports this is a normal menstrual period, denies any concern about bleeding. Urgent care information given to pt in print format. Mobile health schedule given in print format. Pt states she plans to wait in our waiting room until family member comes to pick up 79 month old infant. Then plans to present to urgent care for evaluation.

## 2021-11-09 ENCOUNTER — Emergency Department (HOSPITAL_BASED_OUTPATIENT_CLINIC_OR_DEPARTMENT_OTHER): Payer: Medicaid Other | Admitting: Radiology

## 2021-11-09 ENCOUNTER — Other Ambulatory Visit: Payer: Self-pay

## 2021-11-09 ENCOUNTER — Encounter (HOSPITAL_BASED_OUTPATIENT_CLINIC_OR_DEPARTMENT_OTHER): Payer: Self-pay | Admitting: Emergency Medicine

## 2021-11-09 ENCOUNTER — Emergency Department (HOSPITAL_BASED_OUTPATIENT_CLINIC_OR_DEPARTMENT_OTHER)
Admission: EM | Admit: 2021-11-09 | Discharge: 2021-11-09 | Disposition: A | Discharge status: Home / Self Care | Payer: Medicaid Other | Attending: Emergency Medicine | Admitting: Emergency Medicine

## 2021-11-09 DIAGNOSIS — S99911A Unspecified injury of right ankle, initial encounter: Secondary | ICD-10-CM | POA: Diagnosis present

## 2021-11-09 DIAGNOSIS — I10 Essential (primary) hypertension: Secondary | ICD-10-CM | POA: Insufficient documentation

## 2021-11-09 DIAGNOSIS — J45909 Unspecified asthma, uncomplicated: Secondary | ICD-10-CM | POA: Insufficient documentation

## 2021-11-09 DIAGNOSIS — X501XXA Overexertion from prolonged static or awkward postures, initial encounter: Secondary | ICD-10-CM | POA: Insufficient documentation

## 2021-11-09 DIAGNOSIS — S93401A Sprain of unspecified ligament of right ankle, initial encounter: Secondary | ICD-10-CM | POA: Diagnosis not present

## 2021-11-09 DIAGNOSIS — Z79899 Other long term (current) drug therapy: Secondary | ICD-10-CM | POA: Diagnosis not present

## 2021-11-09 MED ORDER — IBUPROFEN 400 MG PO TABS
600.0000 mg | ORAL_TABLET | Freq: Once | ORAL | Status: AC
  Administered 2021-11-09: 17:00:00 600 mg via ORAL
  Filled 2021-11-09: qty 1

## 2021-11-09 NOTE — Discharge Instructions (Addendum)
Your right ankle x-ray was negative for fracture or dislocation.  This is a likely sprain of the right ankle.  You may wear the splint and use the crutches during the day.  You may remove the splint at night.  You may take over the counter 600 mg ibuprofen every 6 hours or 1000 mg Tylenol every 6 hours as directed. You may apply ice or heat to affected area for up to 15 minutes at a time.  Attached is information for the on-call orthopedist, you may call them and set up a follow-up appointment regarding today's ED visit.  You may follow-up with her primary care provider as needed.  If you do not have a primary care provider you may follow-up with [facility] as needed.  Return to the Emergency Department if you are experiencing increasing/worsening swelling, color change, or worsening symptoms.

## 2021-11-09 NOTE — ED Triage Notes (Signed)
Twisted her R ankle getting out of bed 2 days ago. Pain persists.

## 2021-11-09 NOTE — ED Provider Notes (Signed)
[facility] EMERGENCY DEPT Provider Note   CSN: [id number] Arrival date & time: 11/09/21  1410     History Chief Complaint  Patient presents with   Ankle Injury    Brandy Brock is a 25 y.o. female who presents to the ED complaining of right ankle injury onset 2 days ago.  Patient reports she twisted her right ankle while getting out of bed.  She has associated right ankle swelling.  She has tried ice, Ace wrap, and elevation with no relief of her symptoms.  Denies color change, wound, fever, chills, right foot pain, right knee pain.   The history is provided by the patient. No language interpreter was used.  Ankle Injury This is a new problem. The current episode started 2 days ago. The problem has not changed since onset.The symptoms are aggravated by walking. Nothing relieves the symptoms. She has tried a cold compress for the symptoms. The treatment provided no relief.      Past Medical History:  Diagnosis Date   Asthma    childhood   Elevated liver function tests 12/11/2020   Elevated in MAU on 1/7 - repeat beginning of February    Hypertension    Pregnancy induced hypertension    Syphilis affecting pregnancy    Vaginal Pap smear, abnormal     Patient Active Problem List   Diagnosis Date Noted   Contraception management 06/18/2021   History of low transverse cesarean section 04/30/2021   BMI 60.0-69.9, adult ([place]) 10/13/2008   Asthma 10/13/2008    Past Surgical History:  Procedure Laterality Date   CESAREAN SECTION N/A 03/17/2021   Procedure: CESAREAN SECTION;  Surgeon: Aletha Halim, MD;  Location: [facility];  Service: Obstetrics;  Laterality: N/A;   MULTIPLE EXTRACTIONS WITH ALVEOLOPLASTY N/A 10/11/2016   Procedure: MULTIPLE EXTRACTION WITH ALVEOLOPLASTY;  Surgeon: Diona Browner, DDS;  Location: [facility];  Service: Oral Surgery;  Laterality: N/A;     OB History     Gravida  1   Para  1   Term  1   Preterm  0   AB  0   Living  1       SAB  0   IAB  0   Ectopic  0   Multiple  0   Live Births  1           Family History  Problem Relation Age of Onset   Diabetes Father    Diabetes Brother     Social History   Tobacco Use   Smoking status: Never   Smokeless tobacco: Former  Scientific laboratory technician Use: Never used  Substance Use Topics   Alcohol use: Yes   Drug use: No    Home Medications Prior to Admission medications   Medication Sig Start Date End Date Taking? Authorizing Provider  acetaminophen (TYLENOL) 325 MG tablet Take 650 mg by mouth every 6 (six) hours as needed. Patient not taking: Reported on 06/18/2021    [provider]  albuterol (VENTOLIN HFA) 108 (90 Base) MCG/ACT inhaler Inhale 2 puffs into the lungs every 6 (six) hours as needed for wheezing or shortness of breath. Patient not taking: Reported on 06/18/2021 11/13/20   Virginia Rochester, NP  norethindrone (MICRONOR) 0.35 MG tablet Take 1 tablet (0.35 mg total) by mouth daily. 06/18/21   Chancy Milroy, MD    Allergies    Peanut-containing drug products  Review of Systems   Review of Systems  Constitutional:  Negative for chills and fever.  Musculoskeletal:  Positive for arthralgias and joint swelling.  Skin:  Negative for color change and wound.  All other systems reviewed and are negative.  Physical Exam Updated Vital Signs BP (!) 149/92 (BP Location: Right Arm)    Pulse 61    Temp 98.4 F (36.9 C)    Resp 16    Ht 5\' 7"  (1.702 m)    Wt (!) 163.3 kg    LMP 10/30/2021    SpO2 100%    BMI 56.38 kg/m   Physical Exam Vitals and nursing note reviewed.  Constitutional:      General: She is not in acute distress.    Appearance: Normal appearance.  Eyes:     General: No scleral icterus.    Extraocular Movements: Extraocular movements intact.  Cardiovascular:     Rate and Rhythm: Normal rate.  Pulmonary:     Effort: Pulmonary effort is normal. No respiratory distress.  Musculoskeletal:     Cervical back: Neck supple.      Right ankle: Swelling present. No deformity, ecchymosis or lacerations. Tenderness present over the lateral malleolus. Normal range of motion.     Right Achilles Tendon: Normal.     Left ankle: Normal.     Comments: Mild tenderness to palpation to lateral aspect of right ankle.  Mild swelling noted to right ankle.  No tenderness to palpation to base of right fifth MTP.  No tenderness to palpation to right navicular bone.  No obvious deformity or erythema.  Full active range of motion of right ankle. Patient able to ambulate without difficulty or assistance. Strength and sensation intact to bilateral lower extremities.   Skin:    General: Skin is warm and dry.     Findings: No bruising, erythema or rash.  Neurological:     Mental Status: She is alert.  Psychiatric:        Behavior: Behavior normal.    ED Results / Procedures / Treatments   Labs (all labs ordered are listed, but only abnormal results are displayed) Labs Reviewed - No data to display  EKG None  Radiology DG Ankle Complete Right  Result Date: 11/09/2021 CLINICAL DATA:  Twisted her right ankle EXAM: RIGHT ANKLE - COMPLETE 3+ VIEW COMPARISON:  None. FINDINGS: There is no evidence of fracture, dislocation, or joint effusion. The ankle mortise is preserved. There is no evidence of arthropathy or other focal bone abnormality. Soft tissue swelling about the medial greater than lateral ankle. IMPRESSION: Negative. Electronically Signed   By: Merilyn Baba M.D.   On: 11/09/2021 14:54    Procedures Procedures   Medications Ordered in ED Medications  ibuprofen (ADVIL) tablet 600 mg (600 mg Oral Given 11/09/21 1658)    ED Course  I have reviewed the triage vital signs and the nursing notes.  Pertinent labs & imaging results that were available during my care of the patient were reviewed by me and considered in my medical decision making (see chart for details).  Clinical Course as of 11/09/21 1702  Fri Nov 09, 2021   1656 Patient evaluated prior to discharge.  Patient agreeable with discharge treatment plan.  Patient appears safe for discharge. [SB]    Clinical Course User Index [SB] Atiyana Welte A, PA-C   MDM Rules/Calculators/A&P                         Patient with right ankle pain onset 2 days ago status  post rolling ankle.  She has been able to ambulate since the incident occurred. On exam, patient with mild tenderness to palpation to lateral aspect of right ankle.  Mild swelling noted to right ankle.  No erythema appreciated.  Sensation and pulses intact bilaterally to lower extremities.  Patient given ibuprofen in the ED prior to discharge. Differential diagnosis includes sprain, fracture, dislocation.  Right ankle xray negative for acute fractures or dislocations.  Patient afebrile with vital signs within normal limits, less likely septic arthritis at this time.  Less likely fracture or dislocation due to negative imaging findings.  This is likely acute sprain of right ankle.  Aircast splint and crutches provided to the patient in the ED.    Information for on-call orthopedist provided to patient.  Supportive care and strict return precautions discussed with patient.  Patient acknowledges and voices understanding.  Patient appears safe for discharge at this time.  Follow-up as indicated in discharge paperwork.   Final Clinical Impression(s) / ED Diagnoses Final diagnoses:  Sprain of right ankle, unspecified ligament, initial encounter    Rx / DC Orders ED Discharge Orders     None        Terriyah Westra A, PA-C 11/09/21 1702    Fredia Sorrow, MD 11/22/21 2333

## 2022-02-26 ENCOUNTER — Ambulatory Visit (INDEPENDENT_AMBULATORY_CARE_PROVIDER_SITE_OTHER): Payer: Medicaid Other

## 2022-02-26 DIAGNOSIS — Z3201 Encounter for pregnancy test, result positive: Secondary | ICD-10-CM | POA: Diagnosis not present

## 2022-02-26 DIAGNOSIS — Z3200 Encounter for pregnancy test, result unknown: Secondary | ICD-10-CM

## 2022-02-26 DIAGNOSIS — Z3490 Encounter for supervision of normal pregnancy, unspecified, unspecified trimester: Secondary | ICD-10-CM

## 2022-02-26 NOTE — Progress Notes (Signed)
Possible Pregnancy ? ?Here today for pregnancy confirmation. UPT in office today is positive. Pt reports first positive home UPT on 02/22/22. Patient states she does not have regular menstrual cycle. Is unsure of last menstrual period. Reports possible menstrual period 2 months prior. Dating [place] scheduled for 03/05/22.  ? ?OB history reviewed. Recent c-section on 03/17/21. Reviewed medications and allergies with patient. Recommended pt begin prenatal vitamin and schedule prenatal care following dating [place]. ? ?[name], RN ?02/26/2022  3:22 PM ? ?

## 2022-02-27 LAB — POCT PREGNANCY, URINE: Preg Test, Ur: POSITIVE — AB

## 2022-03-05 ENCOUNTER — Ambulatory Visit (INDEPENDENT_AMBULATORY_CARE_PROVIDER_SITE_OTHER): Payer: Medicaid Other

## 2022-03-05 ENCOUNTER — Other Ambulatory Visit: Payer: Self-pay | Admitting: Obstetrics and Gynecology

## 2022-03-05 DIAGNOSIS — Z3490 Encounter for supervision of normal pregnancy, unspecified, unspecified trimester: Secondary | ICD-10-CM

## 2022-03-05 DIAGNOSIS — O3680X0 Pregnancy with inconclusive fetal viability, not applicable or unspecified: Secondary | ICD-10-CM

## 2022-03-13 ENCOUNTER — Encounter: Payer: Self-pay | Admitting: Obstetrics and Gynecology

## 2022-03-15 ENCOUNTER — Ambulatory Visit (INDEPENDENT_AMBULATORY_CARE_PROVIDER_SITE_OTHER): Payer: Medicaid Other

## 2022-03-15 DIAGNOSIS — Z0130 Encounter for examination of blood pressure without abnormal findings: Secondary | ICD-10-CM | POA: Diagnosis not present

## 2022-03-15 MED ORDER — BLOOD PRESSURE KIT DEVI: 1.0000 | 0 refills | Status: DC | PRN

## 2022-03-15 MED ORDER — GOJJI WEIGHT SCALE MISC: 1.0000 | 0 refills | Status: DC | PRN

## 2022-03-15 NOTE — Progress Notes (Signed)
Pt here today because she has a history of elevated BP in her prior pregnancy.  Pt reports that she was told to stop taking BP medication after delivery.  Pt states that she takes her BP at work and its been 140's/80's and she has intermittent headaches which she takes Tylenol and headache will come back.  BP 142/89 LA.  Rpt RA 149/83.  Reviewed chart with Dr.Arnold who recommends that pt continues to watch her BP and report to the office at this time no medication management.  Prescription sent to [facility] to get BP cuff and pt advised to start taking ASA 81 mg at 12 weeks.  Pt verbalized understanding with no further questions.  ? ?Xiadani Damman,RN  ?03/15/22 ?

## 2022-03-26 ENCOUNTER — Telehealth (INDEPENDENT_AMBULATORY_CARE_PROVIDER_SITE_OTHER): Payer: Medicaid Other

## 2022-03-26 DIAGNOSIS — O0990 Supervision of high risk pregnancy, unspecified, unspecified trimester: Secondary | ICD-10-CM | POA: Insufficient documentation

## 2022-03-26 DIAGNOSIS — O119 Pre-existing hypertension with pre-eclampsia, unspecified trimester: Secondary | ICD-10-CM

## 2022-03-26 DIAGNOSIS — Z3A00 Weeks of gestation of pregnancy not specified: Secondary | ICD-10-CM

## 2022-03-26 NOTE — Progress Notes (Signed)
New OB Intake ? ?I connected with  Brandy Brock on 03/26/22 at  8:15 AM EDT by MyChart Video Visit and verified that I am speaking with the correct person using two identifiers. Nurse is located at [facility] and pt is located at home. ? ?I discussed the limitations, risks, security and privacy concerns of performing an evaluation and management service by telephone and the availability of in person appointments. I also discussed with the patient that there may be a patient responsible charge related to this service. The patient expressed understanding and agreed to proceed. ? ?I explained I am completing New OB Intake today. We discussed her EDD of 10/03/2022 that is based on ultrasound on 03/05/2022. Pt is G2/P1. I reviewed her allergies, medications, Medical/Surgical/OB history, and appropriate screenings. I informed her of [facility] services. Based on history, this is a/an  pregnancy complicated by hypertension .  ? ?Patient Active Problem List  ? Diagnosis Date Noted  ? Supervision of high risk pregnancy, antepartum 03/26/2022  ? Contraception management 06/18/2021  ? History of low transverse cesarean section 04/30/2021  ? BMI 60.0-69.9, adult ([place]) 10/13/2008  ? Asthma 10/13/2008  ? ? ?Delivery Plans:  ?Plans to deliver at [facility].  ? ?MyChart/Babyscripts ?MyChart access verified. I explained pt will have some visits in office and some virtually. Babyscripts instructions given and order placed. Patient verifies receipt of registration text/e-mail. Account successfully created and app downloaded. ? ?Blood Pressure Cuff  ?Patient still has her blood pressure cuff from her previous pregnancy. Explained after first prenatal appt pt will check weekly and document in 25. ? ?Weight scale: Patient does have weight scale. Weight scale ordered for patient to pick up from [employer].  ? ?Anatomy US ?Explained first scheduled [place] will be around 19 weeks. Anatomy US scheduled for 05/09/2022 at 10:30am. Pt  notified to arrive at 10:15am. ? ?Labs ?Discussed Johnsie Cancel genetic screening with patient. Would like both Panorama drawn only.  ? ?Covid Vaccine ?Patient has covid vaccine.  ? ?Is patient a CenteringPregnancy candidate?  ?Declined ?Declined due to Childcare ?  ?Is patient a Mom+Baby Combined Care candidate?  ?Not a candidate   ? ?Is patient interested in [place]?  ?No  ? ?Informed patient of [facility] website  and placed link in her AVS.  ? ?Social Determinants of Health ?Food Insecurity: Patient denies food insecurity. ?WIC Referral: Patient is interested in referral to [facility].  ?Transportation: Patient denies transportation needs. ?Childcare: Discussed no children allowed at ultrasound appointments. Offered childcare services; patient declines childcare services at this time. ? ?Send link to Pregnancy Navigators ? ? ?Placed OB Box on problem list and updated ? ?First visit review ?I reviewed new OB appt with pt. I explained she will have a pelvic exam, ob bloodwork with genetic screening.  Explained pt will be seen by Brandy Halim MD at first visit; encounter routed to appropriate provider. Explained that patient will be seen by pregnancy navigator following visit with provider. [facility] information placed in AVS.  ? ?Brandy Brock, [place] ?03/26/2022  8:46 AM  ?

## 2022-04-07 ENCOUNTER — Ambulatory Visit
Admission: EM | Admit: 2022-04-07 | Discharge: 2022-04-07 | Disposition: A | Discharge status: Home / Self Care | Payer: Medicaid Other | Attending: Emergency Medicine | Admitting: Emergency Medicine

## 2022-04-07 DIAGNOSIS — J00 Acute nasopharyngitis [common cold]: Secondary | ICD-10-CM | POA: Diagnosis not present

## 2022-04-07 DIAGNOSIS — Z3490 Encounter for supervision of normal pregnancy, unspecified, unspecified trimester: Secondary | ICD-10-CM

## 2022-04-07 DIAGNOSIS — R52 Pain, unspecified: Secondary | ICD-10-CM

## 2022-04-07 DIAGNOSIS — J029 Acute pharyngitis, unspecified: Secondary | ICD-10-CM | POA: Diagnosis not present

## 2022-04-07 DIAGNOSIS — B349 Viral infection, unspecified: Secondary | ICD-10-CM | POA: Diagnosis not present

## 2022-04-07 MED ORDER — CETIRIZINE HCL 10 MG PO TABS: 10.0000 mg | ORAL_TABLET | Freq: Every day | ORAL | 2 refills | Status: DC

## 2022-04-07 MED ORDER — FLUTICASONE PROPIONATE 50 MCG/ACT NA SUSP: 1.0000 | Freq: Every day | NASAL | 1 refills | Status: DC

## 2022-04-07 NOTE — Discharge Instructions (Signed)
Your symptoms and physical exam findings are concerning for a viral respiratory infection.     Your rapid influenza test today was negative.  Due to the duration of your symptoms, you would no longer benefit from antiviral therapy for influenza.   You were tested for COVID-19 today.  The result of your COVID-19 test will be posted to your MyChart once it is complete, typically this takes 36 to 48 hours.    If your COVID-19 test is positive, you will be contacted by phone with further recommendations if any.  Due to the duration of your symptoms, you would no longer benefit from the antiviral treatment, Paxlovid.   Based on my physical exam findings and the history you have provided  today, I do not recommend antibiotics at this time.  I do not believe the risks and side effects of antibiotics would outweigh any minimal benefit that they might provide.       Please see the list below for recommended medications, dosages and frequencies to provide relief of your current symptoms:    Tylenol (acetaminophen): This is a good fever reducer.  If your body temperature rises above 101.5 as measured with a thermometer, it is recommended that you take 1,000 mg every 8 hours until your temperature falls below 101.5, please not take more than 3,000 mg of acetaminophen either as a separate medication or as in ingredient in an over-the-counter cold/flu preparation within a 24-hour period.      Flonase (fluticasone): This is a steroid nasal spray that you use once daily, 1 spray in each nare.  After 3 to 5 days of use, you will have significant improvement of the inflammation and mucus production that is being caused by exposure to allergens.  This medication can be purchased over-the-counter however I have provided you with a prescription.      Zyrtec (cetirizine): This is an excellent second-generation antihistamine that helps to reduce respiratory inflammatory response to viruses and environmental allergens.   Please take 1 tablet daily at bedtime.   Please follow-up within the next 5-7 days either with your primary care provider or urgent care if your symptoms do not resolve.  If you do not have a primary care provider, we will assist you in finding one.   Thank you for visiting urgent care today.  We appreciate the opportunity to participate in your care.

## 2022-04-07 NOTE — ED Triage Notes (Signed)
Pt presents with sore throat and congestion X 2 days.

## 2022-04-07 NOTE — ED Provider Notes (Signed)
[facility]    CSN: [id number] Arrival date & time: 04/07/22  0859    HISTORY   Chief Complaint  Patient presents with   URI   HPI Brandy Brock is a 26 y.o. female. Patient presents urgent care today complaining of sore throat and congestion for the past 2 days.  Patient denies pain with swallowing.  Patient denies cough, otalgia, fever, chills, nausea, vomiting, diarrhea.  Patient endorses body aches and recent fatigue.  Patient states she has a history of allergies but not currently taking allergy medications.  Patient has mildly elevated blood pressure on arrival, patient states he is not feeling well right now.  Patient states has not tried any remedies for her current symptoms.  Patient denies known sick contacts.  The history is provided by the patient.  Past Medical History:  Diagnosis Date   Asthma    childhood   Elevated liver function tests 12/11/2020   Elevated in MAU on 1/7 - repeat beginning of February    Hypertension    Pregnancy induced hypertension    Syphilis affecting pregnancy    Vaginal Pap smear, abnormal    Patient Active Problem List   Diagnosis Date Noted   Supervision of high risk pregnancy, antepartum 03/26/2022   Contraception management 06/18/2021   History of low transverse cesarean section 04/30/2021   BMI 60.0-69.9, adult ([place]) 10/13/2008   Asthma 10/13/2008   Past Surgical History:  Procedure Laterality Date   CESAREAN SECTION N/A 03/17/2021   Procedure: CESAREAN SECTION;  Surgeon: Aletha Halim, MD;  Location: [facility] LD [facility];  Service: Obstetrics;  Laterality: N/A;   MULTIPLE EXTRACTIONS WITH ALVEOLOPLASTY N/A 10/11/2016   Procedure: MULTIPLE EXTRACTION WITH ALVEOLOPLASTY;  Surgeon: Diona Browner, DDS;  Location: [facility];  Service: Oral Surgery;  Laterality: N/A;   OB History     Gravida  2   Para  1   Term  1   Preterm  0   AB  0   Living  1      SAB  0   IAB  0   Ectopic  0   Multiple  0   Live Births  1           Home Medications    Prior to Admission medications   Medication Sig Start Date End Date Taking? Authorizing Provider  acetaminophen (TYLENOL) 325 MG tablet Take 650 mg by mouth every 6 (six) hours as needed. Patient not taking: Reported on 06/18/2021    [provider]  albuterol (VENTOLIN HFA) 108 (90 Base) MCG/ACT inhaler Inhale 2 puffs into the lungs every 6 (six) hours as needed for wheezing or shortness of breath. 11/13/20   Virginia Rochester, NP  Blood Pressure Monitoring (BLOOD PRESSURE KIT) DEVI 1 Device by Does not apply route as needed. 03/15/22   Woodroe Mode, MD  Misc. Devices (GOJJI WEIGHT SCALE) MISC 1 Device by Does not apply route as needed. 03/15/22   Woodroe Mode, MD  norethindrone (MICRONOR) 0.35 MG tablet Take 1 tablet (0.35 mg total) by mouth daily. Patient not taking: Reported on 02/26/2022 06/18/21   Chancy Milroy, MD   Family History Family History  Problem Relation Age of Onset   Diabetes Father    Diabetes Brother    Social History Social History   Tobacco Use   Smoking status: Never   Smokeless tobacco: Former  Scientific laboratory technician Use: Never used  Substance Use Topics   Alcohol  use: Not Currently   Drug use: No   Allergies   Peanut-containing drug products  Review of Systems Review of Systems Pertinent findings noted in history of present illness.   Physical Exam Triage Vital Signs ED Triage Vitals  Enc Vitals Group     BP 09/07/21 0827 (!) 147/82     Pulse Rate 09/07/21 0827 72     Resp 09/07/21 0827 18     Temp 09/07/21 0827 98.3 F (36.8 C)     Temp Source 09/07/21 0827 Oral     SpO2 09/07/21 0827 98 %     Weight --      Height --      Head Circumference --      Peak Flow --      Pain Score 09/07/21 0826 5     Pain Loc --      Pain Edu? --      Excl. in [place]? --   No data found.  Updated Vital Signs BP (!) 142/85 (BP Location: Left Arm)   Pulse 88   Temp 97.8 F (36.6 C) (Oral)   LMP  (LMP Unknown)   SpO2  95%   Physical Exam Vitals and nursing note reviewed.  Constitutional:      General: She is not in acute distress.    Appearance: Normal appearance. She is not ill-appearing.  HENT:     Head: Normocephalic and atraumatic.     Salivary Glands: Right salivary gland is not diffusely enlarged or tender. Left salivary gland is not diffusely enlarged or tender.     Right Ear: Tympanic membrane, ear canal and external ear normal. No drainage. No middle ear effusion. There is no impacted cerumen. Tympanic membrane is not erythematous or bulging.     Left Ear: Tympanic membrane, ear canal and external ear normal. No drainage.  No middle ear effusion. There is no impacted cerumen. Tympanic membrane is not erythematous or bulging.     Nose: Nose normal. No nasal deformity, septal deviation, mucosal edema, congestion or rhinorrhea.     Right Turbinates: Not enlarged, swollen or pale.     Left Turbinates: Not enlarged, swollen or pale.     Right Sinus: No maxillary sinus tenderness or frontal sinus tenderness.     Left Sinus: No maxillary sinus tenderness or frontal sinus tenderness.     Mouth/Throat:     Lips: Pink. No lesions.     Mouth: Mucous membranes are moist. No oral lesions.     Pharynx: Oropharynx is clear. Uvula midline. No posterior oropharyngeal erythema or uvula swelling.     Tonsils: No tonsillar exudate. 0 on the right. 0 on the left.  Eyes:     General: Lids are normal.        Right eye: No discharge.        Left eye: No discharge.     Extraocular Movements: Extraocular movements intact.     Conjunctiva/sclera: Conjunctivae normal.     Right eye: Right conjunctiva is not injected.     Left eye: Left conjunctiva is not injected.  Neck:     Trachea: Trachea and phonation normal.  Cardiovascular:     Rate and Rhythm: Normal rate and regular rhythm.     Pulses: Normal pulses.     Heart sounds: Normal heart sounds. No murmur heard.   No friction rub. No gallop.  Pulmonary:      Effort: Pulmonary effort is normal. No accessory muscle usage, prolonged expiration or respiratory  distress.     Breath sounds: Normal breath sounds. No stridor, decreased air movement or transmitted upper airway sounds. No decreased breath sounds, wheezing, rhonchi or rales.  Chest:     Chest wall: No tenderness.  Musculoskeletal:        General: Normal range of motion.     Cervical back: Normal range of motion and neck supple. Normal range of motion.  Lymphadenopathy:     Cervical: No cervical adenopathy.  Skin:    General: Skin is warm and dry.     Findings: No erythema or rash.  Neurological:     General: No focal deficit present.     Mental Status: She is alert and oriented to person, place, and time.  Psychiatric:        Mood and Affect: Mood normal.        Behavior: Behavior normal.    Visual Acuity Right Eye Distance:   Left Eye Distance:   Bilateral Distance:    Right Eye Near:   Left Eye Near:    Bilateral Near:     UC Couse / Diagnostics / Procedures:    EKG  Radiology No results found.  Procedures Procedures (including critical care time)  UC Diagnoses / Final Clinical Impressions(s)   I have reviewed the triage vital signs and the nursing notes.  Pertinent labs & imaging results that were available during my care of the patient were reviewed by me and considered in my medical decision making (see chart for details).   Final diagnoses:  Acute pharyngitis, unspecified etiology  Acute rhinitis  Generalized body aches  Pregnancy, unspecified gestational age  Viral illness   Patient is otherwise well-appearing on exam.  Patient advised that she likely is suffering from a viral illness which will just need to run its course.  Viral testing pending.  Return precautions advised.  Conservative care recommended.  ED Prescriptions     Medication Sig Dispense Auth. Provider   fluticasone (FLONASE) 50 MCG/ACT nasal spray Place 1 spray into both nostrils daily.  Begin by using 2 sprays in each nare daily for 3 to 5 days, then decrease to 1 spray in each nare daily. 32 mL Lynden Oxford Scales, PA-C   cetirizine (ZYRTEC ALLERGY) 10 MG tablet Take 1 tablet (10 mg total) by mouth at bedtime. 30 tablet Lynden Oxford Scales, PA-C      PDMP not reviewed this encounter.  Pending results:  Labs Reviewed  COVID-19, FLU A+B NAA    Medications Ordered in UC: Medications - No data to display  Disposition Upon Discharge:  Condition: stable for discharge home Home: take medications as prescribed; routine discharge instructions as discussed; follow up as advised.  Patient presented with an acute illness with associated systemic symptoms and significant discomfort requiring urgent management. In my opinion, this is a condition that a prudent lay person (someone who possesses an average knowledge of health and medicine) may potentially expect to result in complications if not addressed urgently such as respiratory distress, impairment of bodily function or dysfunction of bodily organs.   Routine symptom specific, illness specific and/or disease specific instructions were discussed with the patient and/or caregiver at length.   As such, the patient has been evaluated and assessed, work-up was performed and treatment was provided in alignment with urgent care protocols and evidence based medicine.  Patient/parent/caregiver has been advised that the patient may require follow up for further testing and treatment if the symptoms continue in spite of treatment, as clinically  indicated and appropriate.  If the patient was tested for COVID-19, Influenza and/or RSV, then the patient/parent/guardian was advised to isolate at home pending the results of his/her diagnostic coronavirus test and potentially longer if they're positive. I have also advised pt that if his/her COVID-19 test returns positive, it's recommended to self-isolate for at least 10 days after symptoms  first appeared AND until fever-free for 24 hours without fever reducer AND other symptoms have improved or resolved. Discussed self-isolation recommendations as well as instructions for household member/close contacts as per the [facility] and [place] DHHS, and also gave patient the [place] packet with this information.  Patient/parent/caregiver has been advised to return to the [facility] or PCP in 3-5 days if no better; to PCP or the Emergency Department if new signs and symptoms develop, or if the current signs or symptoms continue to change or worsen for further workup, evaluation and treatment as clinically indicated and appropriate  The patient will follow up with their current PCP if and as advised. If the patient does not currently have a PCP we will assist them in obtaining one.   The patient may need specialty follow up if the symptoms continue, in spite of conservative treatment and management, for further workup, evaluation, consultation and treatment as clinically indicated and appropriate.  Patient/parent/caregiver verbalized understanding and agreement of plan as discussed.  All questions were addressed during visit.  Please see discharge instructions below for further details of plan.  Discharge Instructions:   Discharge Instructions      Your symptoms and physical exam findings are concerning for a viral respiratory infection.     Your rapid influenza test today was negative.  Due to the duration of your symptoms, you would no longer benefit from antiviral therapy for influenza.   You were tested for COVID-19 today.  The result of your COVID-19 test will be posted to your MyChart once it is complete, typically this takes 36 to 48 hours.    If your COVID-19 test is positive, you will be contacted by phone with further recommendations if any.  Due to the duration of your symptoms, you would no longer benefit from the antiviral treatment, Paxlovid.   Based on my physical exam findings and the history  you have provided  today, I do not recommend antibiotics at this time.  I do not believe the risks and side effects of antibiotics would outweigh any minimal benefit that they might provide.       Please see the list below for recommended medications, dosages and frequencies to provide relief of your current symptoms:    Tylenol (acetaminophen): This is a good fever reducer.  If your body temperature rises above 101.5 as measured with a thermometer, it is recommended that you take 1,000 mg every 8 hours until your temperature falls below 101.5, please not take more than 3,000 mg of acetaminophen either as a separate medication or as in ingredient in an over-the-counter cold/flu preparation within a 24-hour period.      Flonase (fluticasone): This is a steroid nasal spray that you use once daily, 1 spray in each nare.  After 3 to 5 days of use, you will have significant improvement of the inflammation and mucus production that is being caused by exposure to allergens.  This medication can be purchased over-the-counter however I have provided you with a prescription.      Zyrtec (cetirizine): This is an excellent second-generation antihistamine that helps to reduce respiratory inflammatory response to viruses and  environmental allergens.  Please take 1 tablet daily at bedtime.   Please follow-up within the next 5-7 days either with your primary care provider or urgent care if your symptoms do not resolve.  If you do not have a primary care provider, we will assist you in finding one.   Thank you for visiting urgent care today.  We appreciate the opportunity to participate in your care.       This office note has been dictated using Museum/gallery curator.  Unfortunately, and despite my best efforts, this method of dictation can sometimes lead to occasional typographical or grammatical errors.  I apologize in advance if this occurs.     Lynden Oxford Scales, PA-C 04/08/22 1349

## 2022-04-09 LAB — COVID-19, FLU A+B NAA
Influenza A, NAA: NOT DETECTED
Influenza B, NAA: NOT DETECTED
SARS-CoV-2, NAA: NOT DETECTED

## 2022-04-16 ENCOUNTER — Inpatient Hospital Stay (HOSPITAL_COMMUNITY)
Admission: AD | Admit: 2022-04-16 | Discharge: 2022-04-16 | Disposition: A | Discharge status: Home / Self Care | Payer: Medicaid Other | Attending: Obstetrics & Gynecology | Admitting: Obstetrics & Gynecology

## 2022-04-16 ENCOUNTER — Encounter (HOSPITAL_COMMUNITY): Payer: Self-pay | Admitting: Obstetrics & Gynecology

## 2022-04-16 ENCOUNTER — Inpatient Hospital Stay (HOSPITAL_BASED_OUTPATIENT_CLINIC_OR_DEPARTMENT_OTHER): Payer: Medicaid Other

## 2022-04-16 ENCOUNTER — Inpatient Hospital Stay (HOSPITAL_COMMUNITY): Payer: Medicaid Other

## 2022-04-16 DIAGNOSIS — Z3A15 15 weeks gestation of pregnancy: Secondary | ICD-10-CM

## 2022-04-16 DIAGNOSIS — O26892 Other specified pregnancy related conditions, second trimester: Secondary | ICD-10-CM | POA: Diagnosis not present

## 2022-04-16 DIAGNOSIS — W501XXA Accidental kick by another person, initial encounter: Secondary | ICD-10-CM | POA: Diagnosis not present

## 2022-04-16 DIAGNOSIS — O9A212 Injury, poisoning and certain other consequences of external causes complicating pregnancy, second trimester: Secondary | ICD-10-CM | POA: Diagnosis not present

## 2022-04-16 DIAGNOSIS — Z711 Person with feared health complaint in whom no diagnosis is made: Secondary | ICD-10-CM | POA: Diagnosis not present

## 2022-04-16 DIAGNOSIS — R109 Unspecified abdominal pain: Secondary | ICD-10-CM | POA: Diagnosis not present

## 2022-04-16 LAB — CBC
HCT: 32.7 % — ABNORMAL LOW (ref 36.0–46.0)
Hemoglobin: 11 g/dL — ABNORMAL LOW (ref 12.0–15.0)
MCH: 29.8 pg (ref 26.0–34.0)
MCHC: 33.6 g/dL (ref 30.0–36.0)
MCV: 88.6 fL (ref 80.0–100.0)
Platelets: 252 10*3/uL (ref 150–400)
RBC: 3.69 MIL/uL — ABNORMAL LOW (ref 3.87–5.11)
RDW: 12.4 % (ref 11.5–15.5)
WBC: 11.2 10*3/uL — ABNORMAL HIGH (ref 4.0–10.5)
nRBC: 0 % (ref 0.0–0.2)

## 2022-04-16 LAB — URINALYSIS, ROUTINE W REFLEX MICROSCOPIC
Bilirubin Urine: NEGATIVE
Glucose, UA: NEGATIVE mg/dL
Hgb urine dipstick: NEGATIVE
Ketones, ur: NEGATIVE mg/dL
Nitrite: NEGATIVE
Protein, ur: 30 mg/dL — AB
Specific Gravity, Urine: 1.023 (ref 1.005–1.030)
pH: 6 (ref 5.0–8.0)

## 2022-04-16 LAB — TYPE AND SCREEN
ABO/RH(D): B POS
Antibody Screen: NEGATIVE
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
## 2022-04-17 LAB — CULTURE, OB URINE

## 2022-04-22 ENCOUNTER — Ambulatory Visit (INDEPENDENT_AMBULATORY_CARE_PROVIDER_SITE_OTHER): Payer: Medicaid Other | Admitting: Obstetrics and Gynecology

## 2022-04-22 ENCOUNTER — Encounter: Payer: Self-pay | Admitting: Obstetrics and Gynecology

## 2022-04-22 ENCOUNTER — Other Ambulatory Visit (HOSPITAL_COMMUNITY)
Admission: RE | Admit: 2022-04-22 | Discharge: 2022-04-22 | Disposition: A | Discharge status: Home / Self Care | Payer: Medicaid Other | Source: Ambulatory Visit | Attending: Obstetrics and Gynecology | Admitting: Obstetrics and Gynecology

## 2022-04-22 VITALS — BP 128/83 | HR 78 | Wt 377.9 lb

## 2022-04-22 DIAGNOSIS — Z8619 Personal history of other infectious and parasitic diseases: Secondary | ICD-10-CM

## 2022-04-22 DIAGNOSIS — Z8759 Personal history of other complications of pregnancy, childbirth and the puerperium: Secondary | ICD-10-CM | POA: Insufficient documentation

## 2022-04-22 DIAGNOSIS — O99210 Obesity complicating pregnancy, unspecified trimester: Secondary | ICD-10-CM | POA: Insufficient documentation

## 2022-04-22 DIAGNOSIS — O0990 Supervision of high risk pregnancy, unspecified, unspecified trimester: Secondary | ICD-10-CM

## 2022-04-22 DIAGNOSIS — Z6841 Body Mass Index (BMI) 40.0 and over, adult: Secondary | ICD-10-CM

## 2022-04-22 DIAGNOSIS — Z1151 Encounter for screening for human papillomavirus (HPV): Secondary | ICD-10-CM

## 2022-04-22 DIAGNOSIS — Z98891 History of uterine scar from previous surgery: Secondary | ICD-10-CM

## 2022-04-22 DIAGNOSIS — O09899 Supervision of other high risk pregnancies, unspecified trimester: Secondary | ICD-10-CM | POA: Insufficient documentation

## 2022-04-22 DIAGNOSIS — O10919 Unspecified pre-existing hypertension complicating pregnancy, unspecified trimester: Secondary | ICD-10-CM

## 2022-04-22 DIAGNOSIS — R87612 Low grade squamous intraepithelial lesion on cytologic smear of cervix (LGSIL): Secondary | ICD-10-CM

## 2022-04-22 HISTORY — DX: Personal history of other complications of pregnancy, childbirth and the puerperium: Z87.59

## 2022-04-22 NOTE — Addendum Note (Signed)
Addended by: Seth Bake on: 04/22/2022 11:43 AM   Modules accepted: Orders

## 2022-04-22 NOTE — Progress Notes (Signed)
New OB Note  04/22/2022   [facility]inic: [facility]  Chief Complaint: new OB  Transfer of Care Patient: no  History of Present Illness: Brandy Brock is a 26 y.o. G2P1001 @ 16/4 weeks ([place] 11/23, based on 9wk u/s. Patient is unsure of her LMP. Preg complicated by has BMI 12.8-78.6, adult ([place]); Asthma; Chronic hypertension affecting pregnancy; LGSIL on Pap smear of cervix; History of low transverse cesarean section; Supervision of high risk pregnancy, antepartum; Obesity in pregnancy; Short interval between pregnancies affecting pregnancy, antepartum; History of postpartum dehiscence of wound; and History of syphilis on their problem list.   Patient denies any labor or morning sickness s/s.   ROS: A 12-point review of systems was performed and negative, except as stated in the above HPI.  OBGYN History: As per HPI. OB History  Gravida Para Term Preterm AB Living  '2 1 1 '$ 0 0 1  SAB IAB Ectopic Multiple Live Births  0 0 0 0 1    # Outcome Date GA Lbr Len/2nd Weight Sex Delivery Anes PTL Lv  2 Current           1 Term 03/17/21 [redacted]w[redacted]d 5 lb 12.9 oz (2.634 kg) M CS-LTranv Spinal  LIV     Birth Comments: WNL     Past Medical History: Past Medical History:  Diagnosis Date   Asthma    childhood   Elevated liver function tests 12/11/2020   Elevated in MAU on 1/7 - repeat beginning of February    Hypertension    LGSIL of cervix of undetermined significance    Pregnancy induced hypertension    Syphilis affecting pregnancy     Past Surgical History: Past Surgical History:  Procedure Laterality Date   CESAREAN SECTION N/A 03/17/2021   Procedure: CESAREAN SECTION;  Surgeon: PAletha Halim MD;  [facility];  Service: Obstetrics;  Laterality: N/A;   MULTIPLE EXTRACTIONS WITH ALVEOLOPLASTY N/A 10/11/2016   Procedure: MULTIPLE EXTRACTION WITH ALVEOLOPLASTY;  Surgeon: SDiona Browner DDS;  [facility]  Service: Oral Surgery;  Laterality: N/A;     Family History:  Family History  Problem Relation Age of Onset   Diabetes Father    Diabetes Brother     Social History:  Social History   Socioeconomic History   Marital status: Single    Spouse name: CVicente SereneSr   Number of children: Not on file   Years of education: Not on file   Highest education level: Not on file  Occupational History   Not on file  Tobacco Use   Smoking status: Never   Smokeless tobacco: Former  VScientific laboratory technicianUse: Never used  Substance and Sexual Activity   Alcohol use: Not Currently   Drug use: No   Sexual activity: Yes    Birth control/protection: None  Other Topics Concern   Not on file  Social History Narrative   Not on file   Social Determinants of Health   Financial Resource Strain: Not on file  Food Insecurity: No Food Insecurity (04/22/2022)   Hunger Vital Sign    Worried About Running Out of Food in the Last Year: Never true    Ran Out of Food in the Last Year: Never true  Transportation Needs: No Transportation Needs (04/22/2022)   PRAPARE - THydrologist(Medical): No    Lack of Transportation (Non-Medical): No  Physical Activity: Not on file  Stress: Not on file  Social Connections: Not on file  Intimate Partner Violence: Not on file    Allergy: Allergies  Allergen Reactions   Peanut-Containing Drug Products Itching and Swelling    REACTION: Facial itching and swelling.    Current Outpatient Medications: Prenatal vitamin Asa '81mg'$  qday   Physical Exam:   BP 128/83   Pulse 78   Wt (!) 377 lb 14.4 oz (171.4 kg)   LMP  (LMP Unknown)   BMI 60.99 kg/m  Body mass index is 60.99 kg/m. Contractions: Not present Vag. Bleeding: None. FHTs: 138  General appearance: Well nourished, well developed female in no acute distress.  Neck:  Supple, normal appearance, and no thyromegaly  Cardiovascular: S1, S2 normal, no murmur, rub or gallop, regular rate and rhythm Respiratory:  Clear to  auscultation bilateral. Normal respiratory effort Abdomen: positive bowel sounds and no masses, hernias; diffusely non tender to palpation, non distended. Well healed low transverse skin incision  Neuro/Psych:  Normal mood and affect.  Skin:  Warm and dry.  Lymphatic:  No inguinal lymphadenopathy.   Pelvic exam: is limited by body habitus EGBUS: within normal limits, Vagina: within normal limits and with no blood in the vault, Cervix: normal appearing cervix without discharge or lesions, closed/long/high, Uterus:  enlarged, c/w 16 week size, and Adnexa:  normal adnexa and no mass, fullness, tenderness  Laboratory: none  Imaging:  No new imaging  Assessment: pt doing well  Plan: 1. Supervision of high risk pregnancy, antepartum Routine care. Has anatomy u/s already scheduled for later this month - Genetic Screening - GC/Chlamydia probe amp ([place])not at [facility] - Culture, OB Urine - CHL AMB BABYSCRIPTS SCHEDULE OPTIMIZATION - CBC/D/Plt+RPR+Rh+ABO+RubIgG... - AFP, Serum, Open Spina Bifida - Comprehensive metabolic panel - Protein / creatinine ratio, urine - Hemoglobin A1c - TSH  2. History of postpartum dehiscence of wound Pfannenstiel incision after laboring. Prevena used but pt still had post op slight skin dehisence. I told her she has a risk of recurrent but less so if she has a c/s where she has not labored  3. Chronic hypertension affecting pregnancy Doing well on no meds. Continue low dose asa  4. Obesity in pregnancy Weight lower then last pregnancy. Total weight gain goals d/w her  5. BMI 60.0-69.9, adult ([place])  6. History of low transverse cesarean section 03/2021 c/s for arrest of dilation at 6-7cm. Bandl's ring noted during c/s. I told her that this may be reason why she had arrest of dilation. Risk of recurrence unknown but history of this and her size could make urgent c/s difficult, which risk of uterine rupture is higher given 18-55minterval between  pregnancies. I told her that I would recommend a repeat c-section. Pt to consider.   7. Short interval between pregnancies affecting pregnancy, antepartum  8. History of syphilis F/u rpr today  9. LGSIL on Pap smear of cervix F/u pap today  Problem list reviewed and updated.  Follow up in 3 weeks.  The nature of CCanbywith multiple MDs and other Advanced Practice Providers was explained to patient; also emphasized that residents, students are part of our team.  >50% of 45 min visit spent on counseling and coordination of care.     CDurene RomansMD Attending Center for [facility]

## 2022-04-23 LAB — CYTOLOGY - PAP
Chlamydia: NEGATIVE
Comment: NEGATIVE
Comment: NORMAL
Diagnosis: NEGATIVE
Neisseria Gonorrhea: NEGATIVE

## 2022-04-23 LAB — PROTEIN / CREATININE RATIO, URINE
Creatinine, Urine: 88 mg/dL
Protein, Ur: 14.4 mg/dL
Protein/Creat Ratio: 164 mg/g creat (ref 0–200)

## 2022-04-26 LAB — URINE CULTURE, OB REFLEX

## 2022-04-26 LAB — CULTURE, OB URINE

## 2022-04-30 LAB — CBC/D/PLT+RPR+RH+ABO+RUBIGG...
Antibody Screen: NEGATIVE
Basophils Absolute: 0 10*3/uL (ref 0.0–0.2)
Basos: 0 %
EOS (ABSOLUTE): 0.1 10*3/uL (ref 0.0–0.4)
Eos: 1 %
HCV Ab: NONREACTIVE
HIV Screen 4th Generation wRfx: NONREACTIVE
Hematocrit: 34.2 % (ref 34.0–46.6)
Hemoglobin: 11.3 g/dL (ref 11.1–15.9)
Hepatitis B Surface Ag: NEGATIVE
Immature Grans (Abs): 0 10*3/uL (ref 0.0–0.1)
Immature Granulocytes: 0 %
Lymphocytes Absolute: 2.3 10*3/uL (ref 0.7–3.1)
Lymphs: 21 %
MCH: 28.7 pg (ref 26.6–33.0)
MCHC: 33 g/dL (ref 31.5–35.7)
MCV: 87 fL (ref 79–97)
Monocytes Absolute: 0.7 10*3/uL (ref 0.1–0.9)
Monocytes: 6 %
Neutrophils Absolute: 8 10*3/uL — ABNORMAL HIGH (ref 1.4–7.0)
Neutrophils: 72 %
Platelets: 215 10*3/uL (ref 150–450)
RBC: 3.94 x10E6/uL (ref 3.77–5.28)
RDW: 12.5 % (ref 11.7–15.4)
RPR Ser Ql: REACTIVE — AB
Rh Factor: POSITIVE
Rubella Antibodies, IGG: 17 index (ref >0.99)
WBC: 11 10*3/uL — ABNORMAL HIGH (ref 3.4–10.8)

## 2022-04-30 LAB — RPR, QUANT+TP ABS (REFLEX)
Rapid Plasma Reagin, Quant: 1:2 {titer} — ABNORMAL HIGH
T Pallidum Abs: REACTIVE — AB

## 2022-04-30 LAB — AFP, SERUM, OPEN SPINA BIFIDA
AFP MoM: 1.24
AFP Value: 31.2 ng/mL
Gest. Age on Collection Date: 16.4 weeks
Maternal Age At EDD: 25.9 yr
OSBR Risk 1 IN: 10000
Test Results:: NEGATIVE
Weight: 377 [lb_av]

## 2022-04-30 LAB — COMPREHENSIVE METABOLIC PANEL
ALT: 14 IU/L (ref 0–32)
AST: 14 IU/L (ref 0–40)
Albumin/Globulin Ratio: 1.2 (ref 1.2–2.2)
Albumin: 3.7 g/dL — ABNORMAL LOW (ref 3.9–5.0)
Alkaline Phosphatase: 52 IU/L (ref 44–121)
BUN/Creatinine Ratio: 9 (ref 9–23)
BUN: 4 mg/dL — ABNORMAL LOW (ref 6–20)
Bilirubin Total: 0.4 mg/dL (ref 0.0–1.2)
CO2: 21 mmol/L (ref 20–29)
Calcium: 9.3 mg/dL (ref 8.7–10.2)
Chloride: 100 mmol/L (ref 96–106)
Creatinine, Ser: 0.43 mg/dL — ABNORMAL LOW (ref 0.57–1.00)
Globulin, Total: 3.2 g/dL (ref 1.5–4.5)
Glucose: 74 mg/dL (ref 70–99)
Potassium: 3.9 mmol/L (ref 3.5–5.2)
Sodium: 134 mmol/L (ref 134–144)
Total Protein: 6.9 g/dL (ref 6.0–8.5)
eGFR: 138 mL/min/{1.73_m2} (ref >59)

## 2022-04-30 LAB — HCV INTERPRETATION

## 2022-04-30 LAB — HEMOGLOBIN A1C
Est. average glucose Bld gHb Est-mCnc: 94 mg/dL
Hgb A1c MFr Bld: 4.9 % (ref 4.8–5.6)

## 2022-04-30 LAB — TSH: TSH: 1.07 u[IU]/mL (ref 0.450–4.500)

## 2022-05-01 ENCOUNTER — Encounter: Payer: Self-pay | Admitting: Obstetrics and Gynecology

## 2022-05-09 ENCOUNTER — Ambulatory Visit: Payer: Medicaid Other | Attending: Obstetrics and Gynecology

## 2022-05-09 ENCOUNTER — Ambulatory Visit: Payer: Medicaid Other | Admitting: *Deleted

## 2022-05-09 ENCOUNTER — Other Ambulatory Visit: Payer: Self-pay | Admitting: *Deleted

## 2022-05-09 ENCOUNTER — Encounter: Payer: Self-pay | Admitting: *Deleted

## 2022-05-09 VITALS — BP 136/66 | HR 93

## 2022-05-09 DIAGNOSIS — O99212 Obesity complicating pregnancy, second trimester: Secondary | ICD-10-CM

## 2022-05-09 DIAGNOSIS — O0990 Supervision of high risk pregnancy, unspecified, unspecified trimester: Secondary | ICD-10-CM | POA: Diagnosis present

## 2022-05-09 DIAGNOSIS — O10912 Unspecified pre-existing hypertension complicating pregnancy, second trimester: Secondary | ICD-10-CM

## 2022-05-09 DIAGNOSIS — O34219 Maternal care for unspecified type scar from previous cesarean delivery: Secondary | ICD-10-CM

## 2022-05-09 DIAGNOSIS — O09892 Supervision of other high risk pregnancies, second trimester: Secondary | ICD-10-CM

## 2022-05-16 ENCOUNTER — Encounter: Payer: Medicaid Other | Admitting: Family Medicine

## 2022-05-16 ENCOUNTER — Encounter: Payer: Self-pay | Admitting: Family Medicine

## 2022-05-16 NOTE — Progress Notes (Signed)
Patient did not keep appointment today. She will be called to reschedule.  

## 2022-06-07 ENCOUNTER — Encounter: Payer: Self-pay | Admitting: *Deleted

## 2022-06-07 ENCOUNTER — Other Ambulatory Visit: Payer: Self-pay | Admitting: *Deleted

## 2022-06-07 ENCOUNTER — Ambulatory Visit: Payer: Medicaid Other | Attending: Maternal & Fetal Medicine

## 2022-06-07 ENCOUNTER — Ambulatory Visit: Payer: Medicaid Other | Admitting: *Deleted

## 2022-06-07 VITALS — BP 148/70 | HR 86

## 2022-06-07 DIAGNOSIS — O99212 Obesity complicating pregnancy, second trimester: Secondary | ICD-10-CM | POA: Diagnosis present

## 2022-06-07 DIAGNOSIS — O34219 Maternal care for unspecified type scar from previous cesarean delivery: Secondary | ICD-10-CM

## 2022-06-07 DIAGNOSIS — O10912 Unspecified pre-existing hypertension complicating pregnancy, second trimester: Secondary | ICD-10-CM

## 2022-06-07 DIAGNOSIS — O0990 Supervision of high risk pregnancy, unspecified, unspecified trimester: Secondary | ICD-10-CM | POA: Diagnosis present

## 2022-06-07 DIAGNOSIS — R638 Other symptoms and signs concerning food and fluid intake: Secondary | ICD-10-CM

## 2022-06-07 DIAGNOSIS — O09892 Supervision of other high risk pregnancies, second trimester: Secondary | ICD-10-CM | POA: Diagnosis present

## 2022-07-05 ENCOUNTER — Ambulatory Visit: Payer: Medicaid Other | Admitting: *Deleted

## 2022-07-05 ENCOUNTER — Encounter: Payer: Self-pay | Admitting: *Deleted

## 2022-07-05 ENCOUNTER — Other Ambulatory Visit: Payer: Self-pay | Admitting: *Deleted

## 2022-07-05 ENCOUNTER — Ambulatory Visit: Payer: Medicaid Other | Attending: Obstetrics

## 2022-07-05 VITALS — BP 128/78 | HR 82

## 2022-07-05 DIAGNOSIS — O99212 Obesity complicating pregnancy, second trimester: Secondary | ICD-10-CM

## 2022-07-05 DIAGNOSIS — E669 Obesity, unspecified: Secondary | ICD-10-CM

## 2022-07-05 DIAGNOSIS — R638 Other symptoms and signs concerning food and fluid intake: Secondary | ICD-10-CM | POA: Diagnosis present

## 2022-07-05 DIAGNOSIS — O10012 Pre-existing essential hypertension complicating pregnancy, second trimester: Secondary | ICD-10-CM | POA: Diagnosis not present

## 2022-07-05 DIAGNOSIS — O0990 Supervision of high risk pregnancy, unspecified, unspecified trimester: Secondary | ICD-10-CM | POA: Diagnosis present

## 2022-07-05 DIAGNOSIS — O34219 Maternal care for unspecified type scar from previous cesarean delivery: Secondary | ICD-10-CM

## 2022-07-05 DIAGNOSIS — O10912 Unspecified pre-existing hypertension complicating pregnancy, second trimester: Secondary | ICD-10-CM

## 2022-07-05 DIAGNOSIS — Z3A27 27 weeks gestation of pregnancy: Secondary | ICD-10-CM

## 2022-07-08 ENCOUNTER — Encounter: Payer: Medicaid Other | Admitting: Obstetrics and Gynecology

## 2022-07-17 ENCOUNTER — Ambulatory Visit (INDEPENDENT_AMBULATORY_CARE_PROVIDER_SITE_OTHER): Payer: Medicaid Other | Admitting: Family Medicine

## 2022-07-17 ENCOUNTER — Encounter: Payer: Self-pay | Admitting: Family Medicine

## 2022-07-17 VITALS — BP 130/83 | HR 91 | Wt 391.0 lb

## 2022-07-17 DIAGNOSIS — O09893 Supervision of other high risk pregnancies, third trimester: Secondary | ICD-10-CM

## 2022-07-17 DIAGNOSIS — O09899 Supervision of other high risk pregnancies, unspecified trimester: Secondary | ICD-10-CM

## 2022-07-17 DIAGNOSIS — O99213 Obesity complicating pregnancy, third trimester: Secondary | ICD-10-CM

## 2022-07-17 DIAGNOSIS — O99210 Obesity complicating pregnancy, unspecified trimester: Secondary | ICD-10-CM

## 2022-07-17 DIAGNOSIS — O0990 Supervision of high risk pregnancy, unspecified, unspecified trimester: Secondary | ICD-10-CM

## 2022-07-17 DIAGNOSIS — O0993 Supervision of high risk pregnancy, unspecified, third trimester: Secondary | ICD-10-CM

## 2022-07-17 DIAGNOSIS — Z8619 Personal history of other infectious and parasitic diseases: Secondary | ICD-10-CM

## 2022-07-17 DIAGNOSIS — Z8759 Personal history of other complications of pregnancy, childbirth and the puerperium: Secondary | ICD-10-CM

## 2022-07-17 DIAGNOSIS — O10919 Unspecified pre-existing hypertension complicating pregnancy, unspecified trimester: Secondary | ICD-10-CM

## 2022-07-17 DIAGNOSIS — O10913 Unspecified pre-existing hypertension complicating pregnancy, third trimester: Secondary | ICD-10-CM

## 2022-07-17 DIAGNOSIS — Z98891 History of uterine scar from previous surgery: Secondary | ICD-10-CM

## 2022-07-17 DIAGNOSIS — Z3A28 28 weeks gestation of pregnancy: Secondary | ICD-10-CM

## 2022-07-17 NOTE — Progress Notes (Signed)
Patient reports daily pelvic and hip pain that started roughly 2 weeks ago. She described the pain as "sharp" when she walks.  [name] and I discussed Tdap vaccine and why it is reccommended during pregnancy. She decided to wait until next visit to receive vaccine.  [name], [place]   07/17/22

## 2022-07-17 NOTE — Patient Instructions (Signed)

## 2022-07-17 NOTE — Progress Notes (Signed)
   Subjective:  Brandy Brock is a 26 y.o. G2P1001 at 66w6dbeing seen today for ongoing prenatal care.  She is currently monitored for the following issues for this high-risk pregnancy and has BMI 60.0-69.9, adult ([place]; Asthma; Chronic hypertension affecting pregnancy; LGSIL on Pap smear of cervix; History of low transverse cesarean section; Supervision of high risk pregnancy, antepartum; Obesity in pregnancy; Short interval between pregnancies affecting pregnancy, antepartum; History of postpartum dehiscence of wound; and History of syphilis on their problem list.  Patient reports  round ligament pain .  Contractions: Not present. Vag. Bleeding: None.  Movement: Present. Denies leaking of fluid.   The following portions of the patient's history were reviewed and updated as appropriate: allergies, current medications, past family history, past medical history, past social history, past surgical history and problem list. Problem list updated.  Objective:   Vitals:   07/17/22 1409  BP: (!) 143/89  Pulse: 94  Weight: (!) 391 lb (177.4 kg)    Fetal Status: Fetal Heart Rate (bpm): 137   Movement: Present     General:  Alert, oriented and cooperative. Patient is in no acute distress.  Skin: Skin is warm and dry. No rash noted.   Cardiovascular: Normal heart rate noted  Respiratory: Normal respiratory effort, no problems with respiration noted  Abdomen: Soft, gravid, appropriate for gestational age. Pain/Pressure: Present     Pelvic: Vag. Bleeding: None     Cervical exam deferred        Extremities: Normal range of motion.     Mental Status: Normal mood and affect. Normal behavior. Normal judgment and thought content.   Urinalysis:      Assessment and Plan:  Pregnancy: G2P1001 at 24w6d1. Supervision of high risk pregnancy, antepartum BP mild range, see below FHR normal Has not been seen for three months because "life be lifing", stressed importance of routine prenatal care Due  for 28wk labs but not fasting, will return for a lab appt at another date Offered TDAP, prefers to get at next visit Would like post placental IUD, problem list updated  2. Chronic hypertension affecting pregnancy BP elevated on first check, normal on recheck Not taking ASA, encouraged to do so  3. History of low transverse cesarean section Discussed TOLAC vs RCS, consented for TOSchick Shadel Hosptialonsent signed  4. History of postpartum dehiscence of wound Plan for prevena if she has CS  5. History of syphilis Stable titer at new OB of 1:2 Recheck RPR with third trimester labs  6. Obesity in pregnancy Following w MFM, plan for antenatal testing per guidelines  7. Short interval between pregnancies affecting pregnancy, antepartum Last delivery 03/2021  Preterm labor symptoms and general obstetric precautions including but not limited to vaginal bleeding, contractions, leaking of fluid and fetal movement were reviewed in detail with the patient. Please refer to After Visit Summary for other counseling recommendations.  Return in 2 weeks (on 07/31/2022) [facility].   EcClarnce FlockMD

## 2022-07-23 ENCOUNTER — Other Ambulatory Visit: Payer: Medicaid Other

## 2022-07-23 ENCOUNTER — Other Ambulatory Visit: Payer: Self-pay

## 2022-07-23 DIAGNOSIS — O0990 Supervision of high risk pregnancy, unspecified, unspecified trimester: Secondary | ICD-10-CM

## 2022-07-24 LAB — GLUCOSE TOLERANCE, 2 HOURS W/ 1HR
Glucose, 1 hour: 103 mg/dL (ref 70–179)
Glucose, 2 hour: 91 mg/dL (ref 70–152)
Glucose, Fasting: 73 mg/dL (ref 70–91)

## 2022-07-24 LAB — CBC
Hematocrit: 35 % (ref 34.0–46.6)
Hemoglobin: 11.3 g/dL (ref 11.1–15.9)
MCH: 29.1 pg (ref 26.6–33.0)
MCHC: 32.3 g/dL (ref 31.5–35.7)
MCV: 90 fL (ref 79–97)
Platelets: 171 10*3/uL (ref 150–450)
RBC: 3.88 x10E6/uL (ref 3.77–5.28)
RDW: 12.8 % (ref 11.7–15.4)
WBC: 12 10*3/uL — ABNORMAL HIGH (ref 3.4–10.8)

## 2022-07-24 LAB — RPR, QUANT+TP ABS (REFLEX)
Rapid Plasma Reagin, Quant: 1:2 {titer} — ABNORMAL HIGH
T Pallidum Abs: REACTIVE — AB

## 2022-07-24 LAB — RPR: RPR Ser Ql: REACTIVE — AB

## 2022-07-24 LAB — HIV ANTIBODY (ROUTINE TESTING W REFLEX): HIV Screen 4th Generation wRfx: NONREACTIVE

## 2022-07-30 ENCOUNTER — Encounter: Payer: Medicaid Other | Admitting: Family Medicine

## 2022-08-01 ENCOUNTER — Other Ambulatory Visit: Payer: Self-pay

## 2022-08-01 ENCOUNTER — Ambulatory Visit (INDEPENDENT_AMBULATORY_CARE_PROVIDER_SITE_OTHER): Payer: Medicaid Other | Admitting: Obstetrics and Gynecology

## 2022-08-01 VITALS — BP 139/83 | HR 87 | Wt 394.4 lb

## 2022-08-01 DIAGNOSIS — Z98891 History of uterine scar from previous surgery: Secondary | ICD-10-CM

## 2022-08-01 DIAGNOSIS — O0990 Supervision of high risk pregnancy, unspecified, unspecified trimester: Secondary | ICD-10-CM

## 2022-08-01 DIAGNOSIS — O99210 Obesity complicating pregnancy, unspecified trimester: Secondary | ICD-10-CM

## 2022-08-01 DIAGNOSIS — O10913 Unspecified pre-existing hypertension complicating pregnancy, third trimester: Secondary | ICD-10-CM

## 2022-08-01 DIAGNOSIS — O10919 Unspecified pre-existing hypertension complicating pregnancy, unspecified trimester: Secondary | ICD-10-CM

## 2022-08-01 DIAGNOSIS — Z6841 Body Mass Index (BMI) 40.0 and over, adult: Secondary | ICD-10-CM

## 2022-08-01 DIAGNOSIS — Z3A31 31 weeks gestation of pregnancy: Secondary | ICD-10-CM

## 2022-08-01 DIAGNOSIS — O99213 Obesity complicating pregnancy, third trimester: Secondary | ICD-10-CM

## 2022-08-01 DIAGNOSIS — Z0289 Encounter for other administrative examinations: Secondary | ICD-10-CM

## 2022-08-01 DIAGNOSIS — N949 Unspecified condition associated with female genital organs and menstrual cycle: Secondary | ICD-10-CM | POA: Insufficient documentation

## 2022-08-01 DIAGNOSIS — O09893 Supervision of other high risk pregnancies, third trimester: Secondary | ICD-10-CM

## 2022-08-01 DIAGNOSIS — O0993 Supervision of high risk pregnancy, unspecified, third trimester: Secondary | ICD-10-CM

## 2022-08-01 DIAGNOSIS — O09899 Supervision of other high risk pregnancies, unspecified trimester: Secondary | ICD-10-CM

## 2022-08-01 NOTE — Progress Notes (Signed)
   PRENATAL VISIT NOTE  Subjective:  Brandy Brock is a 26 y.o. G2P1001 at 42w0dbeing seen today for ongoing prenatal care.  She is currently monitored for the following issues for this high-risk pregnancy and has BMI 60.0-69.9, adult ([place]; Asthma; Chronic hypertension affecting pregnancy; LGSIL on Pap smear of cervix; History of low transverse cesarean section; Supervision of high risk pregnancy, antepartum; Obesity in pregnancy; Short interval between pregnancies affecting pregnancy, antepartum; History of postpartum dehiscence of wound; and History of syphilis on their problem list.  Patient doing well with no acute concerns today. She reports  lower pelvic pain with movement .  Contractions: Irritability. Vag. Bleeding: None.  Movement: Present. Denies leaking of fluid.   The following portions of the patient's history were reviewed and updated as appropriate: allergies, current medications, past family history, past medical history, past social history, past surgical history and problem list. Problem list updated.  Objective:   Vitals:   08/01/22 1328  BP: 139/83  Pulse: 87  Weight: (!) 394 lb 6.4 oz (178.9 kg)    Fetal Status: Fetal Heart Rate (bpm): 153   Movement: Present     General:  Alert, oriented and cooperative. Patient is in no acute distress.  Skin: Skin is warm and dry. No rash noted.   Cardiovascular: Normal heart rate noted  Respiratory: Normal respiratory effort, no problems with respiration noted  Abdomen: Soft, gravid, appropriate for gestational age.  Pain/Pressure: Present     Pelvic: Cervical exam deferred        Extremities: Normal range of motion.  Edema: Trace  Mental Status:  Normal mood and affect. Normal behavior. Normal judgment and thought content.   Assessment and Plan:  Pregnancy: G2P1001 at 36w0d1. Supervision of high risk pregnancy, antepartum Continue routine prenatal care  2. [redacted] weeks gestation of pregnancy   3. Chronic hypertension  affecting pregnancy Patient on no meds, SBP is borderline, pt has no other symptoms  4. Short interval between pregnancies affecting pregnancy, antepartum   5. Obesity in pregnancy Unable to palpate fundus  6. BMI 60.0-69.9, adult ([place].  Hx of cesarean section: VBAC form not previously scanned into media. TOLAC form re-signed so it can be scanned into the chart  8. Round ligament pain:  Lower pelvic pain c/w round ligament pain.  Advised tylenol , rest and warm heat.  Pt declined flexeril     Preterm labor symptoms and general obstetric precautions including but not limited to vaginal bleeding, contractions, leaking of fluid and fetal movement were reviewed in detail with the patient.  Please refer to After Visit Summary for other counseling recommendations.   Return in about 2 weeks (around 08/15/2022) for [facility]in [facility].   LaLynnda ShieldsMD Faculty Attending Center for [facility]

## 2022-08-01 NOTE — Progress Notes (Signed)
Pt reports daily pelvic pain, sometimes she feels that she can't walk. Yesterday she had a sharpe pain in Pelvic area that took her breath.

## 2022-08-09 ENCOUNTER — Ambulatory Visit: Payer: Medicaid Other | Admitting: *Deleted

## 2022-08-09 ENCOUNTER — Other Ambulatory Visit: Payer: Self-pay | Admitting: *Deleted

## 2022-08-09 ENCOUNTER — Ambulatory Visit: Payer: Medicaid Other | Attending: Obstetrics

## 2022-08-09 ENCOUNTER — Encounter: Payer: Self-pay | Admitting: *Deleted

## 2022-08-09 VITALS — BP 149/88 | HR 88

## 2022-08-09 DIAGNOSIS — O99213 Obesity complicating pregnancy, third trimester: Secondary | ICD-10-CM

## 2022-08-09 DIAGNOSIS — O10012 Pre-existing essential hypertension complicating pregnancy, second trimester: Secondary | ICD-10-CM | POA: Diagnosis not present

## 2022-08-09 DIAGNOSIS — O0990 Supervision of high risk pregnancy, unspecified, unspecified trimester: Secondary | ICD-10-CM | POA: Diagnosis present

## 2022-08-09 DIAGNOSIS — O34219 Maternal care for unspecified type scar from previous cesarean delivery: Secondary | ICD-10-CM

## 2022-08-09 DIAGNOSIS — O09899 Supervision of other high risk pregnancies, unspecified trimester: Secondary | ICD-10-CM

## 2022-08-09 DIAGNOSIS — O10912 Unspecified pre-existing hypertension complicating pregnancy, second trimester: Secondary | ICD-10-CM | POA: Diagnosis not present

## 2022-08-09 DIAGNOSIS — Z3A32 32 weeks gestation of pregnancy: Secondary | ICD-10-CM

## 2022-08-09 DIAGNOSIS — E669 Obesity, unspecified: Secondary | ICD-10-CM | POA: Diagnosis not present

## 2022-08-09 DIAGNOSIS — Z6841 Body Mass Index (BMI) 40.0 and over, adult: Secondary | ICD-10-CM

## 2022-08-16 ENCOUNTER — Encounter: Payer: Medicaid Other | Admitting: Medical

## 2022-08-23 ENCOUNTER — Ambulatory Visit: Payer: Medicaid Other | Attending: Obstetrics

## 2022-08-23 ENCOUNTER — Ambulatory Visit: Payer: Medicaid Other | Admitting: *Deleted

## 2022-08-23 ENCOUNTER — Encounter: Payer: Self-pay | Admitting: *Deleted

## 2022-08-23 VITALS — BP 132/90 | HR 92

## 2022-08-23 DIAGNOSIS — O10912 Unspecified pre-existing hypertension complicating pregnancy, second trimester: Secondary | ICD-10-CM | POA: Insufficient documentation

## 2022-08-23 DIAGNOSIS — O0990 Supervision of high risk pregnancy, unspecified, unspecified trimester: Secondary | ICD-10-CM | POA: Insufficient documentation

## 2022-08-23 IMAGING — US US MFM OB DETAIL+14 WK
1 series · 12 of 28 positions shown · non-contrast
Comparison: none

[Series 1: us mfm ob detail+14 wk · 149 acquisitions, 12 frames shown]
[im 6/149]
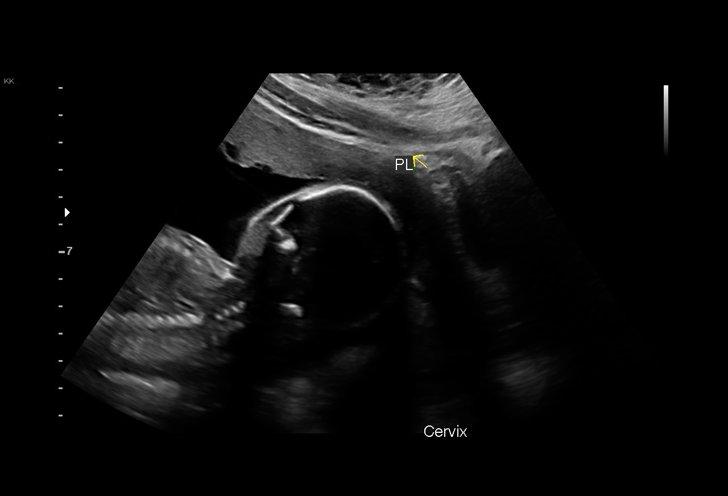
[im 17/149]
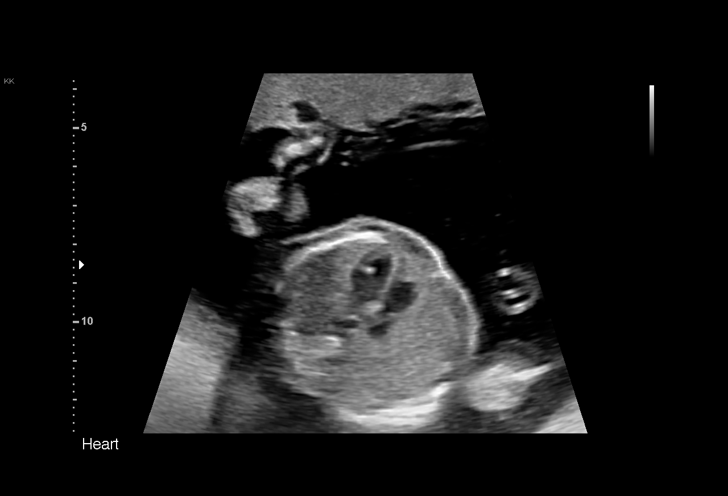
[im 28/149]
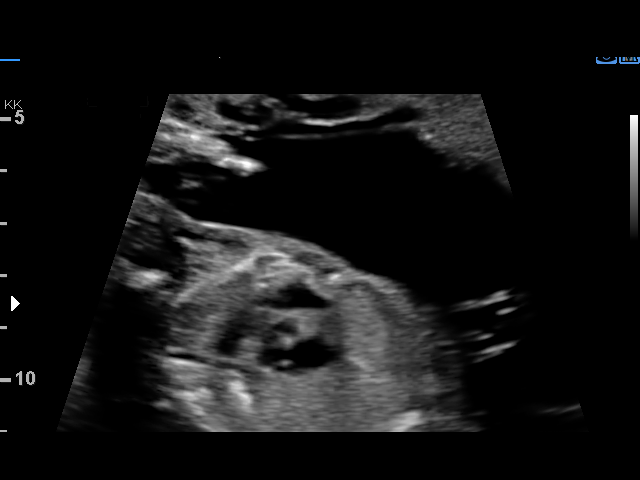
[im 44/149]
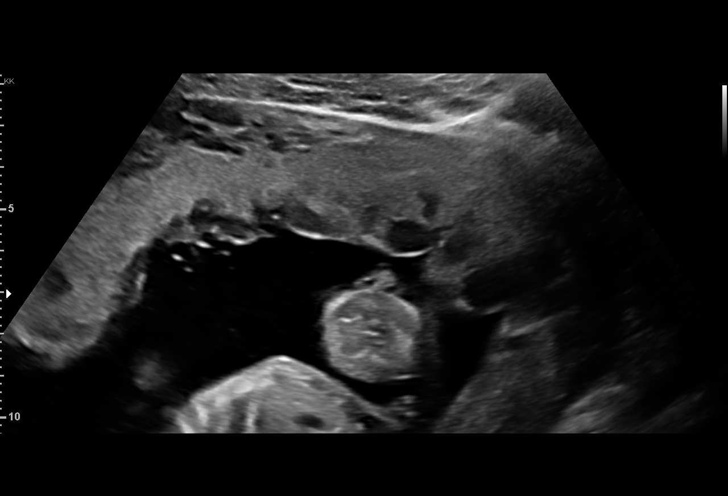
[im 55/149]
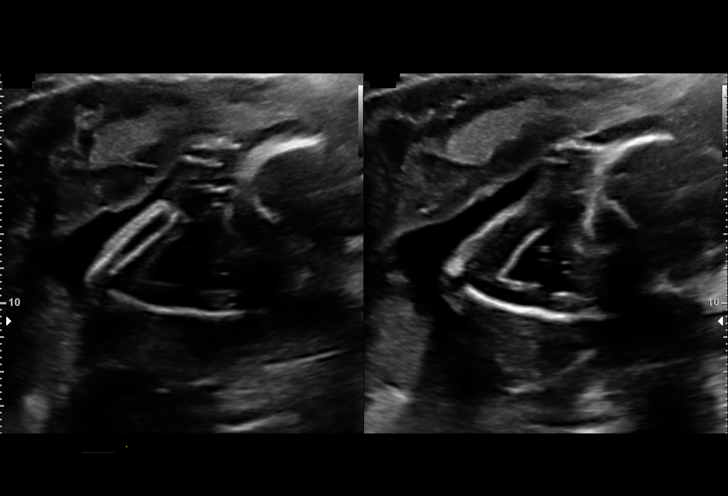
[im 66/149]
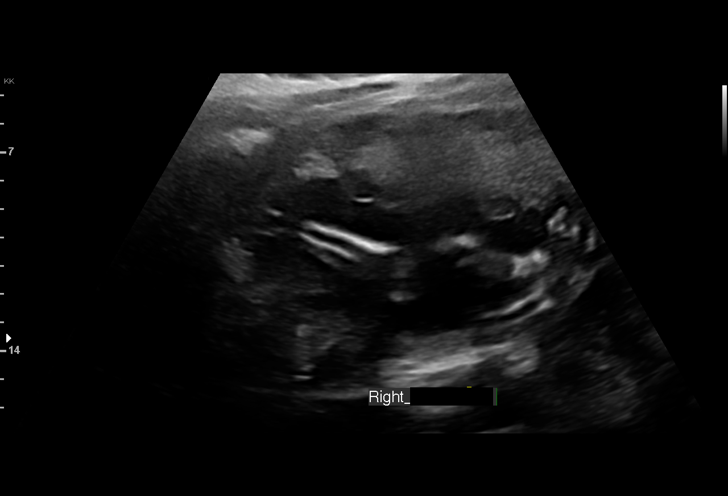
[im 83/149]
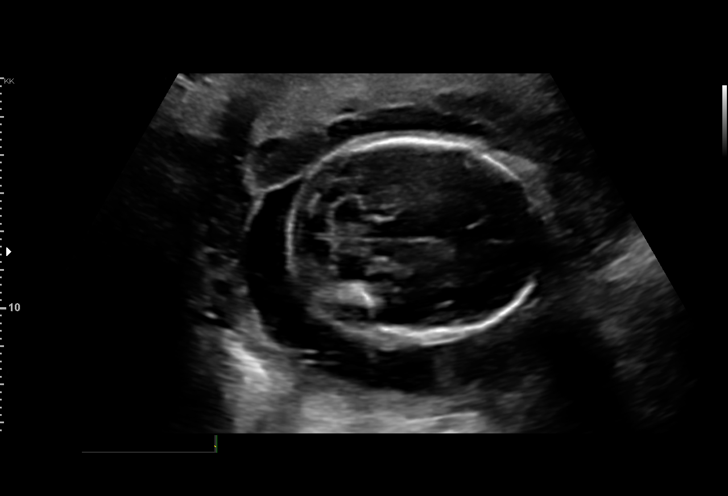
[im 94/149]
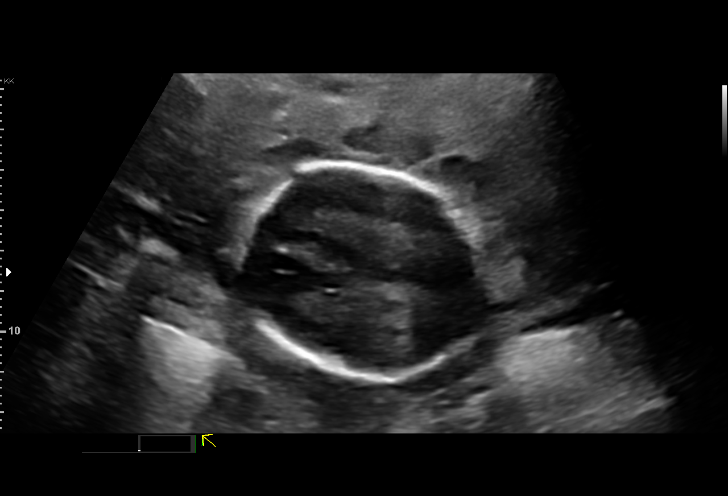
[im 105/149]
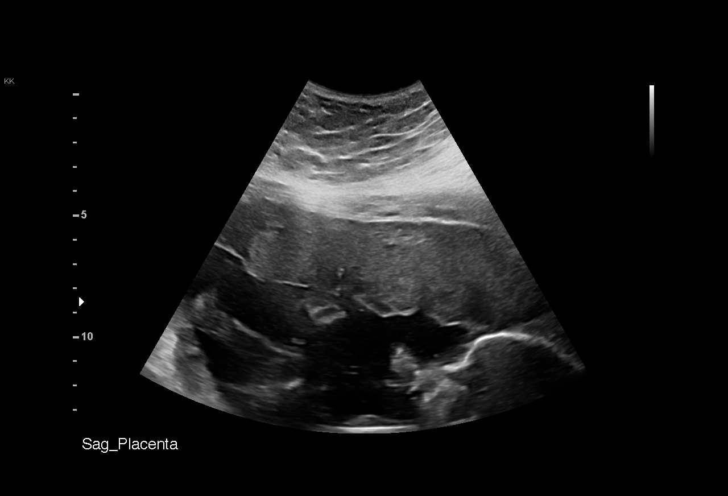
[im 121/149]
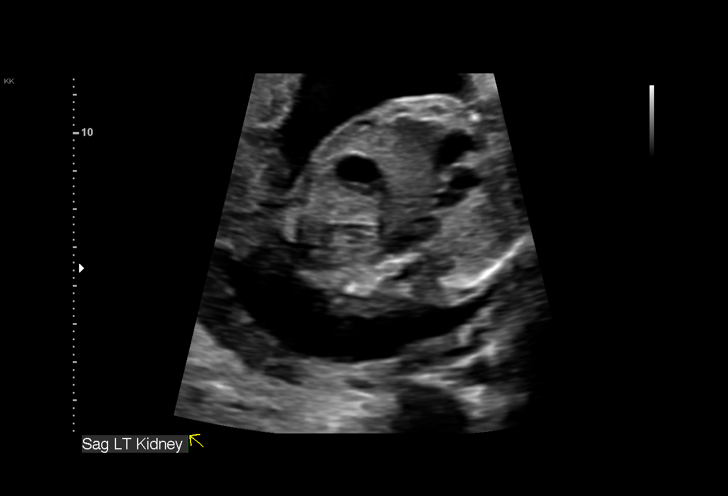
[im 132/149]
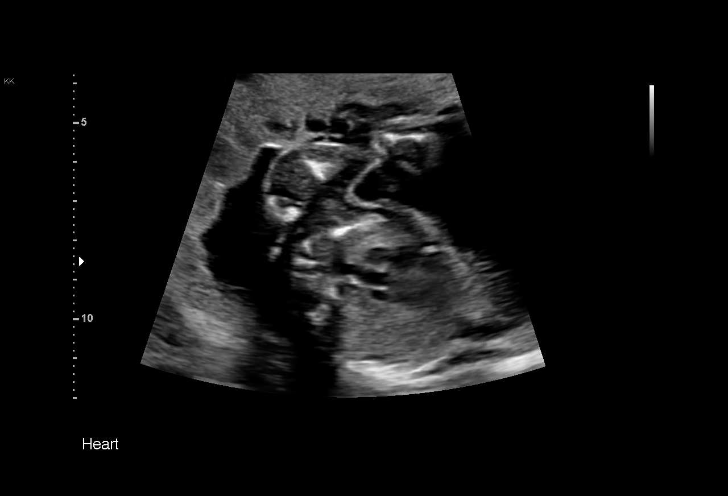
[im 143/149]
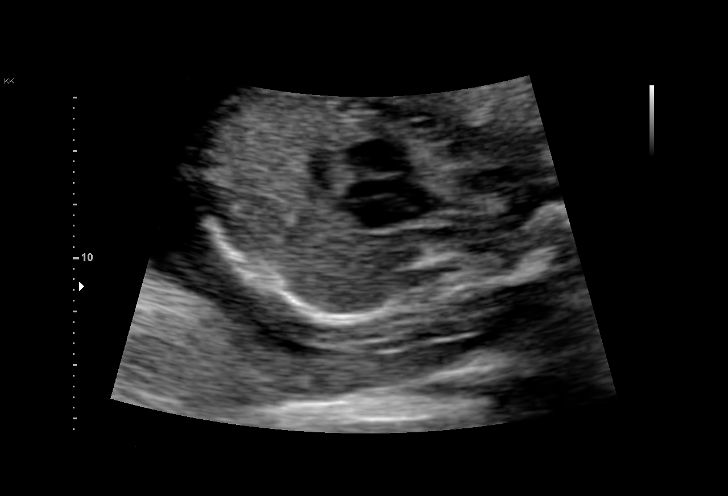

[12 of 28 positions shown; findings below may reference images not displayed]

GEORGES NP                              [HOSPITAL] at
 Ref. Address:     [facility]ulty [facility]

Indications

 Obesity complicating pregnancy, second
 trimester (Pregravid BMI 61)
 Hypertension - Chronic/Pre-existing
 Echogenic intracardiac focus of the heart
 (EIF)
 Asthma                                         VLL.XL j37.333
 Encounter for antenatal screening for
 malformations
 22 weeks gestation of pregnancy
 Low lying placenta, antepartum
Vital Signs

 BMI:
Fetal Evaluation

 Num Of Fetuses:         1
 Cardiac Activity:       Observed
 Presentation:           Cephalic
 Placenta:               Anterior, low-lying,1.6 cm from int os
 P. Cord Insertion:      Visualized

 Amniotic Fluid
 AFI FV:      Within normal limits

                             Largest Pocket(cm)

Biometry

 BPD:      51.4  mm     G. Age:  21w 4d          8  %    CI:        72.31   %    70 - 86
                                                         FL/HC:      19.8   %    19.2 -
 HC:      192.3  mm     G. Age:  21w 3d        2.8  %    HC/AC:      1.08        1.05 -
 AC:      178.3  mm     G. Age:  22w 5d         37  %    FL/BPD:     74.1   %    71 - 87
 FL:       38.1  mm     G. Age:  22w 1d         19  %    FL/AC:      21.4   %    20 - 24
 HUM:      33.5  mm     G. Age:  21w 2d         12  %
 CER:      24.3  mm     G. Age:  22w 2d         60  %
 NFT:       5.5  mm
 CM:        4.9  mm

 Est. FW:     496  gm      1 lb 1 oz     21  %
OB History

 Gravidity:    1
Gestational Age

 LMP:           26w 4d        Date:  06/03/20                 EDD:   03/10/21
 U/S Today:     22w 0d                                        EDD:   04/11/21
 Best:          22w 6d     Det. By:  Previous Ultrasound      EDD:   04/05/21
                                     (10/02/20)
Anatomy

 Cranium:               Appears normal         Aortic Arch:            Appears normal
 Cavum:                 Appears normal         Ductal Arch:            Appears normal
 Ventricles:            Appears normal         Diaphragm:              Appears normal
 Choroid Plexus:        Appears normal         Stomach:                Appears normal, left
                                                                       sided
 Cerebellum:            Appears normal         Abdomen:                Appears normal
 Posterior Fossa:       Appears normal         Abdominal Wall:         Appears nml (cord
                                                                       insert, abd wall)
 Nuchal Fold:           Appears normal         Cord Vessels:           Appears normal (3
                                                                       vessel cord)
 Face:                  Appears normal         Kidneys:                Appear normal
                        (orbits and profile)
 Lips:                  Appears normal         Bladder:                Appears normal
 Thoracic:              Appears normal         Spine:                  Not well visualized
 Heart:                 Appears normal; EIF    Upper Extremities:      Appears normal
 RVOT:                  Appears normal         Lower Extremities:      Appears normal
 LVOT:                  Appears normal

 Other:  Fetus a male. Heels visualized. Technically difficult due to maternal
         habitus and fetal position.
Cervix Uterus Adnexa

 Cervix
 Length:           3.31  cm.
 Normal appearance by transabdominal scan.
Impression

 G1 P0.  Patient is here for fetal anatomy scan.  Apparently,
 she had screening for aneuploidies health department.  We
 do not have the results with us now.
 Patient had treatment for syphilis early in this pregnancy.
 She has chronic hypertension that is well controlled without
 antihypertensives.  Blood pressure today at her office is
 132/83 mmHg.
 She has grade 3 obesity.

 We performed fetal anatomy scan. An echogenic intracardiac
 focus is seen. No other makers of aneuploidies or fetal
 structural defects are seen. Fetal biometry is consistent with
 her previously-established dates. Amniotic fluid is normal and
 good fetal activity is seen.
 Placenta and low lying. Patient does not have symptoms of
 vaginal bleeding. We reassured that low-lying placenta
 resolves with advancing gestation in most cases.
 Maternal obesity imposes limitations on the resolution of
 images, and failure to detect fetal anomalies is more common
 in obese pregnant women. As maternal obesity makes
 clinical assessment of fetal growth difficult, we recommend
 serial growth scans until delivery.
 Echogenic intracardiac focus: I counseled the patient that
 echogenic focus is seen in about 3% to 4% of normal
 fetuses, and in about 15%-20% of fetuses with Down
 syndrome. She was reassured that echogenic focus is not
 associated with any structural heart malformations. Presence
 of this isolated marker only slightly increases the a priori risk
 for Down syndrome.

 I discussed the following options: 1) Maternal blood for cell-
 free fetal DNA screening of trisomies 21, 18 and 13, which
 has a greater detection rate than conventional screening
 tests. I informed the patient that not all chromosomal
 malformations are detected by this test. 2) I informed her that
 only amniocentesis will give a definitive result on the fetal
 karyotype. I discussed a procedure-related pregnancy loss of
 about 1 in 500.

 Patient understands the limitations of ultrasound in detecting
 fetal anomalies and opted to have cell free fetal DNA
 screening.

 Patient was sent over to the lab to have her blood drawn for
 Caternat2V.
Recommendations

 -We will communicate the NIPT results to the patient.
 -Patient will require genetic counseling if cell free fetal DNA
 screening is inconclusive (for example low fetal fraction).
 -An appointment was made for her to return in 4 weeks for
 completion of fetal anatomy.
 -Fetal growth assessment every 4 weeks till delivery.
                 Fta, Emuchay

## 2022-08-27 ENCOUNTER — Encounter: Payer: Medicaid Other | Admitting: Advanced Practice Midwife

## 2022-08-30 ENCOUNTER — Ambulatory Visit: Payer: Medicaid Other | Attending: Maternal & Fetal Medicine | Admitting: *Deleted

## 2022-08-30 ENCOUNTER — Ambulatory Visit (HOSPITAL_BASED_OUTPATIENT_CLINIC_OR_DEPARTMENT_OTHER): Payer: Medicaid Other

## 2022-08-30 VITALS — BP 143/77 | HR 87

## 2022-08-30 DIAGNOSIS — Z6841 Body Mass Index (BMI) 40.0 and over, adult: Secondary | ICD-10-CM | POA: Diagnosis not present

## 2022-08-30 DIAGNOSIS — E669 Obesity, unspecified: Secondary | ICD-10-CM

## 2022-08-30 DIAGNOSIS — O34219 Maternal care for unspecified type scar from previous cesarean delivery: Secondary | ICD-10-CM | POA: Diagnosis not present

## 2022-08-30 DIAGNOSIS — Z3A35 35 weeks gestation of pregnancy: Secondary | ICD-10-CM | POA: Diagnosis not present

## 2022-08-30 DIAGNOSIS — O99213 Obesity complicating pregnancy, third trimester: Secondary | ICD-10-CM | POA: Diagnosis not present

## 2022-08-30 DIAGNOSIS — O10913 Unspecified pre-existing hypertension complicating pregnancy, third trimester: Secondary | ICD-10-CM | POA: Insufficient documentation

## 2022-08-30 DIAGNOSIS — O09899 Supervision of other high risk pregnancies, unspecified trimester: Secondary | ICD-10-CM | POA: Diagnosis not present

## 2022-08-30 DIAGNOSIS — O0990 Supervision of high risk pregnancy, unspecified, unspecified trimester: Secondary | ICD-10-CM

## 2022-09-06 ENCOUNTER — Ambulatory Visit: Payer: Medicaid Other | Admitting: *Deleted

## 2022-09-06 ENCOUNTER — Ambulatory Visit: Payer: Medicaid Other | Attending: Maternal & Fetal Medicine

## 2022-09-06 VITALS — BP 144/72 | HR 82

## 2022-09-06 DIAGNOSIS — Z3A36 36 weeks gestation of pregnancy: Secondary | ICD-10-CM | POA: Insufficient documentation

## 2022-09-06 DIAGNOSIS — O34219 Maternal care for unspecified type scar from previous cesarean delivery: Secondary | ICD-10-CM | POA: Diagnosis not present

## 2022-09-06 DIAGNOSIS — O09893 Supervision of other high risk pregnancies, third trimester: Secondary | ICD-10-CM

## 2022-09-06 DIAGNOSIS — Z6841 Body Mass Index (BMI) 40.0 and over, adult: Secondary | ICD-10-CM

## 2022-09-06 DIAGNOSIS — O09899 Supervision of other high risk pregnancies, unspecified trimester: Secondary | ICD-10-CM | POA: Diagnosis present

## 2022-09-06 DIAGNOSIS — O99213 Obesity complicating pregnancy, third trimester: Secondary | ICD-10-CM | POA: Insufficient documentation

## 2022-09-06 DIAGNOSIS — O0990 Supervision of high risk pregnancy, unspecified, unspecified trimester: Secondary | ICD-10-CM

## 2022-09-06 DIAGNOSIS — O10913 Unspecified pre-existing hypertension complicating pregnancy, third trimester: Secondary | ICD-10-CM | POA: Insufficient documentation

## 2022-09-13 ENCOUNTER — Other Ambulatory Visit
Admission: RE | Admit: 2022-09-13 | Discharge: 2022-09-13 | Disposition: A | Discharge status: Home / Self Care | Payer: Medicaid Other | Source: Ambulatory Visit | Attending: Maternal & Fetal Medicine | Admitting: Maternal & Fetal Medicine

## 2022-09-13 ENCOUNTER — Other Ambulatory Visit: Payer: Self-pay

## 2022-09-13 ENCOUNTER — Ambulatory Visit (INDEPENDENT_AMBULATORY_CARE_PROVIDER_SITE_OTHER): Payer: Medicaid Other | Admitting: Obstetrics and Gynecology

## 2022-09-13 ENCOUNTER — Ambulatory Visit (HOSPITAL_BASED_OUTPATIENT_CLINIC_OR_DEPARTMENT_OTHER): Payer: Medicaid Other | Admitting: Obstetrics

## 2022-09-13 ENCOUNTER — Inpatient Hospital Stay (HOSPITAL_COMMUNITY)
Admission: AD | Admit: 2022-09-13 | Discharge: 2022-09-13 | Disposition: A | Discharge status: Home / Self Care | Payer: Medicaid Other | Attending: Obstetrics and Gynecology | Admitting: Obstetrics and Gynecology

## 2022-09-13 ENCOUNTER — Ambulatory Visit: Payer: Medicaid Other | Admitting: *Deleted

## 2022-09-13 ENCOUNTER — Ambulatory Visit (HOSPITAL_BASED_OUTPATIENT_CLINIC_OR_DEPARTMENT_OTHER): Payer: Medicaid Other

## 2022-09-13 ENCOUNTER — Encounter (HOSPITAL_COMMUNITY): Payer: Self-pay | Admitting: Obstetrics and Gynecology

## 2022-09-13 VITALS — BP 162/89 | Wt >= 6400 oz

## 2022-09-13 VITALS — BP 159/85 | HR 89

## 2022-09-13 DIAGNOSIS — O10913 Unspecified pre-existing hypertension complicating pregnancy, third trimester: Secondary | ICD-10-CM

## 2022-09-13 DIAGNOSIS — O0990 Supervision of high risk pregnancy, unspecified, unspecified trimester: Secondary | ICD-10-CM | POA: Diagnosis not present

## 2022-09-13 DIAGNOSIS — Z6841 Body Mass Index (BMI) 40.0 and over, adult: Secondary | ICD-10-CM

## 2022-09-13 DIAGNOSIS — O99213 Obesity complicating pregnancy, third trimester: Secondary | ICD-10-CM

## 2022-09-13 DIAGNOSIS — E669 Obesity, unspecified: Secondary | ICD-10-CM | POA: Diagnosis not present

## 2022-09-13 DIAGNOSIS — O99210 Obesity complicating pregnancy, unspecified trimester: Secondary | ICD-10-CM

## 2022-09-13 DIAGNOSIS — Z3A37 37 weeks gestation of pregnancy: Secondary | ICD-10-CM

## 2022-09-13 DIAGNOSIS — Z8619 Personal history of other infectious and parasitic diseases: Secondary | ICD-10-CM

## 2022-09-13 DIAGNOSIS — O09899 Supervision of other high risk pregnancies, unspecified trimester: Secondary | ICD-10-CM

## 2022-09-13 DIAGNOSIS — O09893 Supervision of other high risk pregnancies, third trimester: Secondary | ICD-10-CM

## 2022-09-13 DIAGNOSIS — Z98891 History of uterine scar from previous surgery: Secondary | ICD-10-CM

## 2022-09-13 DIAGNOSIS — O34219 Maternal care for unspecified type scar from previous cesarean delivery: Secondary | ICD-10-CM

## 2022-09-13 DIAGNOSIS — O10919 Unspecified pre-existing hypertension complicating pregnancy, unspecified trimester: Secondary | ICD-10-CM

## 2022-09-13 DIAGNOSIS — Z8759 Personal history of other complications of pregnancy, childbirth and the puerperium: Secondary | ICD-10-CM

## 2022-09-13 LAB — COMPREHENSIVE METABOLIC PANEL
ALT: 13 U/L (ref 0–44)
AST: 19 U/L (ref 15–41)
Albumin: 2.9 g/dL — ABNORMAL LOW (ref 3.5–5.0)
Alkaline Phosphatase: 67 U/L (ref 38–126)
Anion gap: 7 (ref 5–15)
BUN: <5 mg/dL — ABNORMAL LOW (ref 6–20)
CO2: 22 mmol/L (ref 22–32)
Calcium: 9.1 mg/dL (ref 8.9–10.3)
Chloride: 105 mmol/L (ref 98–111)
Creatinine, Ser: 0.45 mg/dL (ref 0.44–1.00)
GFR, Estimated: >60 mL/min (ref >60)
Glucose, Bld: 88 mg/dL (ref 70–99)
Potassium: 4.2 mmol/L (ref 3.5–5.1)
Sodium: 134 mmol/L — ABNORMAL LOW (ref 135–145)
Total Bilirubin: 0.5 mg/dL (ref 0.3–1.2)
Total Protein: 6.8 g/dL (ref 6.5–8.1)

## 2022-09-13 LAB — URINALYSIS, ROUTINE W REFLEX MICROSCOPIC
Bilirubin Urine: NEGATIVE
Glucose, UA: NEGATIVE mg/dL
Hgb urine dipstick: NEGATIVE
Ketones, ur: NEGATIVE mg/dL
Leukocytes,Ua: NEGATIVE
Nitrite: NEGATIVE
Protein, ur: 30 mg/dL — AB
Specific Gravity, Urine: 1.029 (ref 1.005–1.030)
Squamous Epithelial / HPF: >50 — ABNORMAL HIGH (ref 0–5)
pH: 6 (ref 5.0–8.0)

## 2022-09-13 LAB — CBC
HCT: 33.1 % — ABNORMAL LOW (ref 36.0–46.0)
Hemoglobin: 11.3 g/dL — ABNORMAL LOW (ref 12.0–15.0)
MCH: 30.3 pg (ref 26.0–34.0)
MCHC: 34.1 g/dL (ref 30.0–36.0)
MCV: 88.7 fL (ref 80.0–100.0)
Platelets: 173 10*3/uL (ref 150–400)
RBC: 3.73 MIL/uL — ABNORMAL LOW (ref 3.87–5.11)
RDW: 13.4 % (ref 11.5–15.5)
WBC: 12.8 10*3/uL — ABNORMAL HIGH (ref 4.0–10.5)
nRBC: 0 % (ref 0.0–0.2)

## 2022-09-13 LAB — PROTEIN / CREATININE RATIO, URINE
Creatinine, Urine: 274 mg/dL
Protein Creatinine Ratio: 0.15 mg/mg{Cre} (ref 0.00–0.15)
Total Protein, Urine: 41 mg/dL

## 2022-09-13 NOTE — Progress Notes (Signed)
PRENATAL VISIT NOTE  Subjective:  Brandy Brock is a 26 y.o. G2P1001 at 77w1dbeing seen today for ongoing prenatal care.  She is currently monitored for the following issues for this high-risk pregnancy and has BMI 60.0-69.9, adult ([place]; Asthma; Chronic hypertension affecting pregnancy; LGSIL on Pap smear of cervix; History of low transverse cesarean section; Supervision of high risk pregnancy, antepartum; Obesity in pregnancy; Short interval between pregnancies affecting pregnancy, antepartum; History of postpartum dehiscence of wound; History of syphilis; and Round ligament pain on their problem list.  Patient reports no complaints.  Contractions: Irritability. Vag. Bleeding: None.  Movement: Present. Denies leaking of fluid.   The following portions of the patient's history were reviewed and updated as appropriate: allergies, current medications, past family history, past medical history, past social history, past surgical history and problem list.   Objective:   Vitals:   09/13/22 1002 09/13/22 1013 09/13/22 1017  BP: (!) 154/83 (!) 160/85 (!) 162/89  Weight: (!) 401 lb 14.4 oz (182.3 kg)      Fetal Status: Fetal Heart Rate (bpm): 145   Movement: Present     General:  Alert, oriented and cooperative. Patient is in no acute distress.  Skin: Skin is warm and dry. No rash noted.   Cardiovascular: Normal heart rate noted  Respiratory: Normal respiratory effort, no problems with respiration noted  Abdomen: Soft, gravid, appropriate for gestational age.  Pain/Pressure: Present     Pelvic: Cervical exam performed in the presence of a chaperone        Extremities: Normal range of motion.     Mental Status: Normal mood and affect. Normal behavior. Normal judgment and thought content.   Assessment and Plan:  Pregnancy: G2P1001 at 377w1d. [redacted] weeks gestation of pregnancy GBS swas done today  2. Chronic hypertension affecting pregnancy On no meds. 150s/80s in mfm. In HROB, she is  150s-low 160s/80s. NAD/normal s1 and s2, no MRGs/CTAB Bpp today 8/8, cephalic, afi 19  Recommend mau eval to see if she has severe pre-eclampsia. Had may 2022 IOL for cHTN as well at 37/0 weeks. MAU aware  3. BMI 60.0-69.9, adult ([place] 4. Obesity in pregnancy  5. History of low transverse cesarean section May 2022 PLTCS for arrest of dilation at 7cm after several day long IOL. Bandl's ring noted. Long d/w pt re: this. I told her that the ring is likely the cause for arrest of dilation and thankfully it was below the above and didn't hinder delivery at the time of c-sectin. I d/w her no real data in regards to recurrence. Given she is not in labor and her history, BMI,  I told her I would recommend a repeat c-section given high risk of repeat scenario with several day long IOL, with increased uterine rupture risk; high risk of arrest of dilation/encountering ring again. Patient to consider. She is amenable to staying npo until mau eval is complete and dispo made  6. History of postpartum dehiscence of wound Recommend prevena  7. History of syphilis F/u admit RPR  8. Short interval between pregnancies affecting pregnancy, antepartum  Term labor symptoms and general obstetric precautions including but not limited to vaginal bleeding, contractions, leaking of fluid and fetal movement were reviewed in detail with the patient. Please refer to After Visit Summary for other counseling recommendations.   No follow-ups on file.  Future Appointments  Date Time Provider DeBoykins11/01/2022 10:35 AM PiAletha HalimMD [facility]11/08/2022  8:30 AM  [facility] NURSE [facility]  09/20/2022  8:45 AM [facility] US5 [facility]  09/23/2022  9:55 AM Chancy Milroy, MD [facility]  09/30/2022 10:55 AM Chancy Milroy, MD [facility]  10/07/2022  9:15 AM [facility] NST [facility]  10/07/2022 11:15 AM Chancy Milroy, MD [facility]    Aletha Halim, MD

## 2022-09-13 NOTE — Discharge Instructions (Signed)

## 2022-09-13 NOTE — Progress Notes (Signed)
MFM Note  Brandy Brock was seen for a BPP due to to maternal obesity with a BMI of 60.  She also has a history of chronic hypertension that is not treated with any medications.  She denies any problems since her last exam.  Her blood pressures today were 159/83 and 159/85.  She denies any signs or symptoms of preeclampsia.  A BPP performed today was 8 out of 8.  There was normal amniotic fluid noted today.  Preeclampsia precautions were reviewed today.    As her blood pressures have been elevated for the past few weeks along with her other comorbidities, delivery should be considered soon.  The patient has a prior cesarean delivery and would like to attempt a TOLAC/VBAC.    No further exams were scheduled in our office . A total of 20 minutes was spent counseling and coordinating the care for this patient.  Greater than 50% of the time was spent in direct face-to-face contact.

## 2022-09-13 NOTE — MAU Note (Addendum)
.  Brandy Brock is a 26 y.o. at 20w1dhere in MAU reporting:  Sent here from office for pre-eclampsia evaluation.  Pt reported hypertension only during pregnancy; chronic hypertension noted in chart. No headache, blurry vision, or floaters, and no RUQ/epigastic pain at this time. Reports swelling in her ankles. No vaginal discharge or bleeding, pt reports + FM.  Pain score: 0/10 Vitals:   09/13/22 1520 09/13/22 1532  BP: 131/66 (!) 140/75  Pulse: 91 79  Resp: 18   Temp: 98.7 F (37.1 C)      FHT:125 Lab orders placed from triage:  ua

## 2022-09-13 NOTE — MAU Provider Note (Signed)
History     CSN: [id number]  Arrival date and time: 09/13/22 1449   Event Date/Time   First Provider Initiated Contact with Patient 09/13/22 1537      Chief Complaint  Patient presents with   Hypertension   HPI  Brandy Brock is a 26 y.o. G2P1001 at 51w1dwho presents for evaluation of elevated blood pressures. Patient was seen in the office today and had new onset severe range blood pressures. She has chronic hypertension and is not on any medication. She denies any headache, visual changes or epigastric pain.  She denies any vaginal bleeding, discharge, and leaking of fluid. Denies any constipation, diarrhea or any urinary complaints. Reports normal fetal movement.   OB History     Gravida  2   Para  1   Term  1   Preterm  0   AB  0   Living  1      SAB  0   IAB  0   Ectopic  0   Multiple  0   Live Births  1           Past Medical History:  Diagnosis Date   Asthma    childhood   Chlamydia    Elevated liver function tests 12/11/2020   Elevated in MAU on 1/7 - repeat beginning of February    Hypertension    LGSIL of cervix of undetermined significance    Pregnancy induced hypertension    Syphilis affecting pregnancy     Past Surgical History:  Procedure Laterality Date   CESAREAN SECTION N/A 03/17/2021   Procedure: CESAREAN SECTION;  Surgeon: PAletha Halim MD;  Location: [facility];  Service: Obstetrics;  Laterality: N/A;   MULTIPLE EXTRACTIONS WITH ALVEOLOPLASTY N/A 10/11/2016   Procedure: MULTIPLE EXTRACTION WITH ALVEOLOPLASTY;  Surgeon: SDiona Browner DDS;  [facility]  Service: Oral Surgery;  Laterality: N/A;    Family History  Problem Relation Age of Onset   Diabetes Father    Diabetes Brother     Social History   Tobacco Use   Smoking status: Never   Smokeless tobacco: Former  VScientific laboratory technicianUse: Never used  Substance Use Topics   Alcohol use: Not Currently   Drug use: No    Allergies:  Allergies  Allergen  Reactions   Peanut-Containing Drug Products Itching and Swelling    REACTION: Facial itching and swelling.    Medications Prior to Admission  Medication Sig Dispense Refill Last Dose   aspirin EC 81 MG tablet Take 81 mg by mouth daily. Swallow whole.   09/13/2022   Blood Pressure Monitoring (BLOOD PRESSURE KIT) DEVI 1 Device by Does not apply route as needed. 1 each 0 Past Week   Misc. Devices (GOJJI WEIGHT SCALE) MISC 1 Device by Does not apply route as needed. 1 each 0 Past Week   Prenatal Vit-Fe Fumarate-FA (MULTIVITAMIN-PRENATAL) 27-0.8 MG TABS tablet Take 1 tablet by mouth daily at 12 noon.   09/12/2022   albuterol (VENTOLIN HFA) 108 (90 Base) MCG/ACT inhaler Inhale 2 puffs into the lungs every 6 (six) hours as needed for wheezing or shortness of breath. 8 g 2 More than a month    Review of Systems  Constitutional: Negative.  Negative for fatigue and fever.  HENT: Negative.    Respiratory: Negative.  Negative for shortness of breath.   Cardiovascular: Negative.  Negative for chest pain.  Gastrointestinal: Negative.  Negative for abdominal pain, constipation, diarrhea, nausea and  vomiting.  Genitourinary: Negative.  Negative for dysuria, vaginal bleeding and vaginal discharge.  Neurological: Negative.  Negative for dizziness and headaches.   Physical Exam   Blood pressure (!) 141/71, pulse 74, temperature 98.7 F (37.1 C), temperature source Oral, resp. rate 18, weight (!) 180.8 kg, SpO2 98 %, currently breastfeeding.  Patient Vitals for the past 24 hrs:  BP Temp Temp src Pulse Resp SpO2 Weight  09/13/22 1730 (!) 141/71 -- -- 74 -- 98 % --  09/13/22 1716 -- -- -- 78 -- -- --  09/13/22 1700 (!) 148/80 -- -- 79 -- 98 % --  09/13/22 1646 (!) 154/85 -- -- 80 -- -- --  09/13/22 1630 (!) 145/76 -- -- 63 -- 98 % --  09/13/22 1616 (!) 141/80 -- -- 77 -- -- --  09/13/22 1600 (!) 122/59 -- -- 81 -- 98 % --  09/13/22 1545 134/68 -- -- 79 -- 99 % --  09/13/22 1532 (!) 140/75 -- -- 79 --  -- --  09/13/22 1520 131/66 98.7 F (37.1 C) Oral 91 18 -- --  09/13/22 1502 -- -- -- -- -- -- (!) 180.8 kg    Physical Exam Vitals and nursing note reviewed.  Constitutional:      General: She is not in acute distress.    Appearance: She is well-developed.  HENT:     Head: Normocephalic.  Eyes:     Pupils: Pupils are equal, round, and reactive to light.  Cardiovascular:     Rate and Rhythm: Normal rate and regular rhythm.     Heart sounds: Normal heart sounds.  Pulmonary:     Effort: Pulmonary effort is normal. No respiratory distress.     Breath sounds: Normal breath sounds.  Abdominal:     General: Bowel sounds are normal. There is no distension.     Palpations: Abdomen is soft.     Tenderness: There is no abdominal tenderness.  Skin:    General: Skin is warm and dry.  Neurological:     Mental Status: She is alert and oriented to person, place, and time.  Psychiatric:        Mood and Affect: Mood normal.        Behavior: Behavior normal.        Thought Content: Thought content normal.        Judgment: Judgment normal.     Fetal Tracing:  Baseline: 125 Variability: moderate Accels: 15x15 Decels: none  Toco: none  MAU Course  Procedures  Results for orders placed or performed during the hospital encounter of 09/13/22 (from the past 24 hour(s))  Urinalysis, Routine w reflex microscopic     Status: Abnormal   Collection Time: 09/13/22  2:54 PM  Result Value Ref Range   Color, Urine AMBER (A) YELLOW   APPearance CLOUDY (A) CLEAR   Specific Gravity, Urine 1.029 1.005 - 1.030   pH 6.0 5.0 - 8.0   Glucose, UA NEGATIVE NEGATIVE mg/dL   Hgb urine dipstick NEGATIVE NEGATIVE   Bilirubin Urine NEGATIVE NEGATIVE   Ketones, ur NEGATIVE NEGATIVE mg/dL   Protein, ur 30 (A) NEGATIVE mg/dL   Nitrite NEGATIVE NEGATIVE   Leukocytes,Ua NEGATIVE NEGATIVE   RBC / HPF 0-5 0 - 5 RBC/hpf   WBC, UA 0-5 0 - 5 WBC/hpf   Bacteria, UA FEW (A) NONE SEEN   Squamous Epithelial /  LPF >50 (H) 0 - 5   Mucus PRESENT   Protein / creatinine ratio, urine  Status: None   Collection Time: 09/13/22  2:54 PM  Result Value Ref Range   Creatinine, Urine 274 mg/dL   Total Protein, Urine 41 mg/dL   Protein Creatinine Ratio 0.15 0.00 - 0.15 mg/mg[Cre]  CBC     Status: Abnormal   Collection Time: 09/13/22  3:10 PM  Result Value Ref Range   WBC 12.8 (H) 4.0 - 10.5 K/uL   RBC 3.73 (L) 3.87 - 5.11 MIL/uL   Hemoglobin 11.3 (L) 12.0 - 15.0 g/dL   HCT 33.1 (L) 36.0 - 46.0 %   MCV 88.7 80.0 - 100.0 fL   MCH 30.3 26.0 - 34.0 pg   MCHC 34.1 30.0 - 36.0 g/dL   RDW 13.4 11.5 - 15.5 %   Platelets 173 150 - 400 K/uL   nRBC 0.0 0.0 - 0.2 %  Comprehensive metabolic panel     Status: Abnormal   Collection Time: 09/13/22  3:10 PM  Result Value Ref Range   Sodium 134 (L) 135 - 145 mmol/L   Potassium 4.2 3.5 - 5.1 mmol/L   Chloride 105 98 - 111 mmol/L   CO2 22 22 - 32 mmol/L   Glucose, Bld 88 70 - 99 mg/dL   BUN <5 (L) 6 - 20 mg/dL   Creatinine, Ser 0.45 0.44 - 1.00 mg/dL   Calcium 9.1 8.9 - 10.3 mg/dL   Total Protein 6.8 6.5 - 8.1 g/dL   Albumin 2.9 (L) 3.5 - 5.0 g/dL   AST 19 15 - 41 U/L   ALT 13 0 - 44 U/L   Alkaline Phosphatase 67 38 - 126 U/L   Total Bilirubin 0.5 0.3 - 1.2 mg/dL   GFR, Estimated >60 >60 mL/min   Anion gap 7 5 - 15     [place] MFM FETAL BPP WO NON STRESS  Result Date: 09/13/2022 ----------------------------------------------------------------------  OBSTETRICS REPORT                       (Signed Final 09/13/2022 10:14 am) ---------------------------------------------------------------------- Patient Info  ID #:       [id number]                          D.O.B.:  10-12-1996 (25 yrs)  Name:       Brandy Brock                Visit Date: 09/13/2022 07:28 am ---------------------------------------------------------------------- Performed By  Attending:        Johnell Comings MD         Secondary Phy.:   [facility]  Performed By:     Stephenie Acres        Address:          [address]  [place], [place]  Referred By:      Marshall Cork          [facility]                                                             [facility]                                                             [facility] ---------------------------------------------------------------------- Orders  #  Description                           Code        Ordered By  1  [place] MFM FETAL BPP WO NON               76819.01    [facility]     STRESS ----------------------------------------------------------------------  #  Order #                     Accession #                Episode #  1  [id number]                   [id number]                 [id number] ---------------------------------------------------------------------- Indications  Obesity complicating pregnancy, third          O99.213  trimester (BMI 60)  Hypertension - Chronic/Pre-existing            O10.019  Short interval between pregancies, 3rd         O09.893  trimester  Encounter for other antenatal screening        Z36.2  follow-up  History of cesarean delivery, currently        O34.219  pregnant  LR NIPS  [redacted] weeks gestation of pregnancy                Z3A.37 ---------------------------------------------------------------------- Fetal Evaluation  Num Of Fetuses:         1  Fetal Heart Rate(bpm):  136  Cardiac Activity:       Observed  Presentation:           Cephalic  Placenta:  Posterior  P. Cord Insertion:      Previously Visualized  Amniotic Fluid  AFI FV:      Within normal limits  AFI Sum(cm)     %Tile       Largest Pocket(cm)  19.4            75          6.6  RUQ(cm)       RLQ(cm)       LUQ(cm)        LLQ(cm)  6              6.6           3.1            3.7 ---------------------------------------------------------------------- Biophysical Evaluation  Amniotic F.V:   Pocket => 2 cm             F. Tone:        Observed  F. Movement:    Observed                   Score:          8/8  F. Breathing:   Observed ---------------------------------------------------------------------- Biometry  LV:        5.3  mm ---------------------------------------------------------------------- OB History  Gravidity:    2         Term:   1  Living:       1p ---------------------------------------------------------------------- Gestational Age  Best:          37w 1d     Det. ByLoman Chroman         EDD:   10/03/22                                      (03/05/22) ---------------------------------------------------------------------- Anatomy  Cranium:               Appears normal         Stomach:                Appears normal, left                                                                        sided  Ventricles:            Appears normal         Kidneys:                Appear normal  Diaphragm:             Appears normal         Bladder:                Appears normal ---------------------------------------------------------------------- Comments  Kasidi Toney was seen for a BPP due to to maternal  obesity with a BMI of 60.  She also has a history of chronic  hypertension that is not treated with any medications.  She  denies any problems since her last exam.  Her blood pressures today were 159/83 and 159/85.  She  denies any signs or symptoms of preeclampsia.  A BPP performed today was 8  out of 8.  There was normal amniotic fluid noted today.  Preeclampsia precautions were reviewed today.  As her blood pressures have been elevated for the past few  weeks along with her other comorbidities, delivery should be  considered soon.  The patient has a prior cesarean delivery  and would like to attempt a TOLAC/VBAC.  No further exams were scheduled in  our office.  A total of 20 minutes was spent counseling and coordinating  the care for this patient.  Greater than 50% of the time was  spent in direct face-to-face contact. ----------------------------------------------------------------------                   Johnell Comings, MD Electronically Signed Final Report   09/13/2022 10:14 am ----------------------------------------------------------------------    MDM Labs ordered and reviewed.   UA CBC, CMP, Protein/creat ratio  Patient states she no longer wants to [facility] and desires repeat cesarean.  CNM consulted with Dr. Damita Dunnings regarding presentation and results- MD recommends scheduling her repeat c/s on Sunday 11/5. Patient agreeable to plan of care  Assessment and Plan   1. Chronic hypertension affecting pregnancy   2. [redacted] weeks gestation of pregnancy     -Discharge home in stable condition -Preeclampsia precautions discussed -Patient advised to follow-up with [facility] on Sunday for repeat c/s -Patient may return to MAU as needed or if her condition were to change or worsen  Wende Mott, CNM 09/13/2022, 3:37 PM

## 2022-09-14 ENCOUNTER — Other Ambulatory Visit: Payer: Self-pay | Admitting: Obstetrics and Gynecology

## 2022-09-14 DIAGNOSIS — Z01818 Encounter for other preprocedural examination: Secondary | ICD-10-CM

## 2022-09-14 DIAGNOSIS — I10 Essential (primary) hypertension: Secondary | ICD-10-CM | POA: Insufficient documentation

## 2022-09-14 DIAGNOSIS — O10919 Unspecified pre-existing hypertension complicating pregnancy, unspecified trimester: Secondary | ICD-10-CM | POA: Insufficient documentation

## 2022-09-14 MED ORDER — CEFAZOLIN IN SODIUM CHLORIDE 3-0.9 GM/100ML-% IV SOLN: 3.0000 g | INTRAVENOUS | Status: DC

## 2022-09-14 MED ORDER — SOD CITRATE-CITRIC ACID 500-334 MG/5ML PO SOLN: 30.0000 mL | ORAL | Status: DC

## 2022-09-15 ENCOUNTER — Inpatient Hospital Stay
Admission: RE | Admit: 2022-09-15 | Discharge: 2022-09-17 | DRG: 788 | Disposition: A | Discharge status: Home / Self Care | Payer: Medicaid Other | Attending: Obstetrics and Gynecology | Admitting: Obstetrics and Gynecology

## 2022-09-15 ENCOUNTER — Inpatient Hospital Stay (HOSPITAL_COMMUNITY): Payer: Medicaid Other | Admitting: Anesthesiology

## 2022-09-15 ENCOUNTER — Encounter (HOSPITAL_COMMUNITY): Payer: Self-pay | Admitting: Obstetrics and Gynecology

## 2022-09-15 ENCOUNTER — Encounter (HOSPITAL_COMMUNITY)
Admission: RE | Disposition: A | Discharge status: Home / Self Care | Payer: Self-pay | Source: Home / Self Care | Attending: Obstetrics and Gynecology

## 2022-09-15 ENCOUNTER — Other Ambulatory Visit: Payer: Self-pay

## 2022-09-15 DIAGNOSIS — Z7982 Long term (current) use of aspirin: Secondary | ICD-10-CM

## 2022-09-15 DIAGNOSIS — Z23 Encounter for immunization: Secondary | ICD-10-CM

## 2022-09-15 DIAGNOSIS — Z3A00 Weeks of gestation of pregnancy not specified: Secondary | ICD-10-CM | POA: Diagnosis not present

## 2022-09-15 DIAGNOSIS — O9081 Anemia of the puerperium: Secondary | ICD-10-CM | POA: Diagnosis not present

## 2022-09-15 DIAGNOSIS — O164 Unspecified maternal hypertension, complicating childbirth: Secondary | ICD-10-CM | POA: Diagnosis not present

## 2022-09-15 DIAGNOSIS — Z8759 Personal history of other complications of pregnancy, childbirth and the puerperium: Secondary | ICD-10-CM

## 2022-09-15 DIAGNOSIS — O34219 Maternal care for unspecified type scar from previous cesarean delivery: Secondary | ICD-10-CM | POA: Diagnosis not present

## 2022-09-15 DIAGNOSIS — O1002 Pre-existing essential hypertension complicating childbirth: Principal | ICD-10-CM | POA: Diagnosis present

## 2022-09-15 DIAGNOSIS — O99214 Obesity complicating childbirth: Secondary | ICD-10-CM | POA: Diagnosis present

## 2022-09-15 DIAGNOSIS — O99210 Obesity complicating pregnancy, unspecified trimester: Secondary | ICD-10-CM | POA: Diagnosis present

## 2022-09-15 DIAGNOSIS — O139 Gestational [pregnancy-induced] hypertension without significant proteinuria, unspecified trimester: Secondary | ICD-10-CM | POA: Diagnosis present

## 2022-09-15 DIAGNOSIS — Z8619 Personal history of other infectious and parasitic diseases: Secondary | ICD-10-CM | POA: Diagnosis present

## 2022-09-15 DIAGNOSIS — J45909 Unspecified asthma, uncomplicated: Secondary | ICD-10-CM

## 2022-09-15 DIAGNOSIS — Z3A37 37 weeks gestation of pregnancy: Secondary | ICD-10-CM | POA: Diagnosis not present

## 2022-09-15 DIAGNOSIS — O9952 Diseases of the respiratory system complicating childbirth: Secondary | ICD-10-CM | POA: Diagnosis not present

## 2022-09-15 DIAGNOSIS — O34211 Maternal care for low transverse scar from previous cesarean delivery: Secondary | ICD-10-CM | POA: Diagnosis present

## 2022-09-15 DIAGNOSIS — O0990 Supervision of high risk pregnancy, unspecified, unspecified trimester: Secondary | ICD-10-CM

## 2022-09-15 DIAGNOSIS — O10919 Unspecified pre-existing hypertension complicating pregnancy, unspecified trimester: Secondary | ICD-10-CM | POA: Diagnosis present

## 2022-09-15 DIAGNOSIS — I10 Essential (primary) hypertension: Secondary | ICD-10-CM | POA: Diagnosis present

## 2022-09-15 DIAGNOSIS — O134 Gestational [pregnancy-induced] hypertension without significant proteinuria, complicating childbirth: Secondary | ICD-10-CM | POA: Diagnosis not present

## 2022-09-15 DIAGNOSIS — Z98891 History of uterine scar from previous surgery: Secondary | ICD-10-CM

## 2022-09-15 LAB — CBC
HCT: 35 % — ABNORMAL LOW (ref 36.0–46.0)
Hemoglobin: 11.3 g/dL — ABNORMAL LOW (ref 12.0–15.0)
MCH: 29.7 pg (ref 26.0–34.0)
MCHC: 32.3 g/dL (ref 30.0–36.0)
MCV: 91.9 fL (ref 80.0–100.0)
Platelets: 165 10*3/uL (ref 150–400)
RBC: 3.81 MIL/uL — ABNORMAL LOW (ref 3.87–5.11)
RDW: 13.4 % (ref 11.5–15.5)
WBC: 11.3 10*3/uL — ABNORMAL HIGH (ref 4.0–10.5)
nRBC: 0 % (ref 0.0–0.2)

## 2022-09-15 LAB — TYPE AND SCREEN
ABO/RH(D): B POS
Antibody Screen: NEGATIVE

## 2022-09-15 LAB — COMPREHENSIVE METABOLIC PANEL
ALT: 11 U/L (ref 0–44)
AST: 18 U/L (ref 15–41)
Albumin: 2.7 g/dL — ABNORMAL LOW (ref 3.5–5.0)
Alkaline Phosphatase: 69 U/L (ref 38–126)
Anion gap: 10 (ref 5–15)
BUN: <5 mg/dL — ABNORMAL LOW (ref 6–20)
CO2: 21 mmol/L — ABNORMAL LOW (ref 22–32)
Calcium: 8.8 mg/dL — ABNORMAL LOW (ref 8.9–10.3)
Chloride: 103 mmol/L (ref 98–111)
Creatinine, Ser: 0.53 mg/dL (ref 0.44–1.00)
GFR, Estimated: >60 mL/min (ref >60)
Glucose, Bld: 78 mg/dL (ref 70–99)
Potassium: 4 mmol/L (ref 3.5–5.1)
Sodium: 134 mmol/L — ABNORMAL LOW (ref 135–145)
Total Bilirubin: 0.6 mg/dL (ref 0.3–1.2)
Total Protein: 6.1 g/dL — ABNORMAL LOW (ref 6.5–8.1)

## 2022-09-15 LAB — PROTEIN / CREATININE RATIO, URINE
Creatinine, Urine: 209 mg/dL
Protein Creatinine Ratio: 0.67 mg/mg{Cre} — ABNORMAL HIGH (ref 0.00–0.15)
Total Protein, Urine: 139 mg/dL

## 2022-09-15 SURGERY — Surgical Case
Anesthesia: Spinal

## 2022-09-15 MED ORDER — EPHEDRINE 5 MG/ML INJ
INTRAVENOUS | Status: AC
  Filled 2022-09-15: qty 5

## 2022-09-15 MED ORDER — FENTANYL CITRATE (PF) 100 MCG/2ML IJ SOLN: 25.0000 ug | INTRAMUSCULAR | Status: DC | PRN

## 2022-09-15 MED ORDER — NALOXONE HCL 4 MG/10ML IJ SOLN: 1.0000 ug/kg/h | INTRAVENOUS | Status: DC | PRN

## 2022-09-15 MED ORDER — DIPHENHYDRAMINE HCL 50 MG/ML IJ SOLN: 12.5000 mg | INTRAMUSCULAR | Status: DC | PRN

## 2022-09-15 MED ORDER — SENNOSIDES-DOCUSATE SODIUM 8.6-50 MG PO TABS: 2.0000 | ORAL_TABLET | Freq: Every evening | ORAL | Status: DC | PRN

## 2022-09-15 MED ORDER — ACETAMINOPHEN 10 MG/ML IV SOLN
INTRAVENOUS | Status: DC | PRN
  Administered 2022-09-15: 1000 mg via INTRAVENOUS

## 2022-09-15 MED ORDER — DEXAMETHASONE SODIUM PHOSPHATE 10 MG/ML IJ SOLN
INTRAMUSCULAR | Status: DC | PRN
  Administered 2022-09-15: 10 mg via INTRAVENOUS

## 2022-09-15 MED ORDER — OXYTOCIN-SODIUM CHLORIDE 30-0.9 UT/500ML-% IV SOLN: 2.5000 [IU]/h | INTRAVENOUS | Status: AC

## 2022-09-15 MED ORDER — EPINEPHRINE 1 MG/10ML IJ SOSY
PREFILLED_SYRINGE | INTRAMUSCULAR | Status: DC | PRN
  Administered 2022-09-15: 100 ug via INTRAVENOUS

## 2022-09-15 MED ORDER — SOD CITRATE-CITRIC ACID 500-334 MG/5ML PO SOLN
30.0000 mL | ORAL | Status: AC
  Administered 2022-09-15: 30 mL via ORAL

## 2022-09-15 MED ORDER — STERILE WATER FOR IRRIGATION IR SOLN
Status: DC | PRN
  Administered 2022-09-15: 1

## 2022-09-15 MED ORDER — ONDANSETRON HCL 4 MG/2ML IJ SOLN
INTRAMUSCULAR | Status: AC
  Filled 2022-09-15: qty 2

## 2022-09-15 MED ORDER — DIPHENHYDRAMINE HCL 25 MG PO CAPS: 25.0000 mg | ORAL_CAPSULE | ORAL | Status: DC | PRN

## 2022-09-15 MED ORDER — ACETAMINOPHEN 10 MG/ML IV SOLN: 1000.0000 mg | Freq: Once | INTRAVENOUS | Status: DC | PRN

## 2022-09-15 MED ORDER — WITCH HAZEL-GLYCERIN EX PADS: 1.0000 | MEDICATED_PAD | CUTANEOUS | Status: DC | PRN

## 2022-09-15 MED ORDER — PHENYLEPHRINE HCL (PRESSORS) 10 MG/ML IV SOLN
INTRAVENOUS | Status: DC | PRN
  Administered 2022-09-15 (×6): 80 ug via INTRAVENOUS

## 2022-09-15 MED ORDER — LACTATED RINGERS IV SOLN: INTRAVENOUS | Status: DC

## 2022-09-15 MED ORDER — MENTHOL 3 MG MT LOZG: 1.0000 | LOZENGE | OROMUCOSAL | Status: DC | PRN

## 2022-09-15 MED ORDER — COCONUT OIL OIL: 1.0000 | TOPICAL_OIL | Status: DC | PRN

## 2022-09-15 MED ORDER — SIMETHICONE 80 MG PO CHEW
80.0000 mg | CHEWABLE_TABLET | Freq: Three times a day (TID) | ORAL | Status: DC
  Administered 2022-09-15 – 2022-09-17 (×5): 80 mg via ORAL
  Filled 2022-09-15 (×5): qty 1

## 2022-09-15 MED ORDER — NALOXONE HCL 0.4 MG/ML IJ SOLN: 0.4000 mg | INTRAMUSCULAR | Status: DC | PRN

## 2022-09-15 MED ORDER — ACETAMINOPHEN 10 MG/ML IV SOLN
INTRAVENOUS | Status: AC
  Filled 2022-09-15: qty 100

## 2022-09-15 MED ORDER — OXYCODONE HCL 5 MG PO TABS: 5.0000 mg | ORAL_TABLET | ORAL | Status: DC | PRN

## 2022-09-15 MED ORDER — ATROPINE SULFATE 0.4 MG/ML IV SOLN
INTRAVENOUS | Status: AC
  Filled 2022-09-15: qty 1

## 2022-09-15 MED ORDER — PHENYLEPHRINE 80 MCG/ML (10ML) SYRINGE FOR IV PUSH (FOR BLOOD PRESSURE SUPPORT)
PREFILLED_SYRINGE | INTRAVENOUS | Status: AC
  Filled 2022-09-15: qty 20

## 2022-09-15 MED ORDER — OXYTOCIN-SODIUM CHLORIDE 30-0.9 UT/500ML-% IV SOLN
INTRAVENOUS | Status: AC
  Filled 2022-09-15: qty 500

## 2022-09-15 MED ORDER — CEFAZOLIN IN SODIUM CHLORIDE 3-0.9 GM/100ML-% IV SOLN
3.0000 g | INTRAVENOUS | Status: AC
  Administered 2022-09-15: 3 g via INTRAVENOUS

## 2022-09-15 MED ORDER — SODIUM CHLORIDE 0.9% FLUSH: 3.0000 mL | INTRAVENOUS | Status: DC | PRN

## 2022-09-15 MED ORDER — KETOROLAC TROMETHAMINE 30 MG/ML IJ SOLN
INTRAMUSCULAR | Status: AC
  Filled 2022-09-15: qty 1

## 2022-09-15 MED ORDER — SODIUM CHLORIDE 0.9 % IR SOLN
Status: DC | PRN
  Administered 2022-09-15: 1

## 2022-09-15 MED ORDER — ATROPINE SULFATE 0.4 MG/ML IV SOLN
INTRAVENOUS | Status: DC | PRN
  Administered 2022-09-15: .4 mg via INTRAVENOUS

## 2022-09-15 MED ORDER — BUPIVACAINE IN DEXTROSE 0.75-8.25 % IT SOLN
INTRATHECAL | Status: DC | PRN
  Administered 2022-09-15: 2 mL via INTRATHECAL

## 2022-09-15 MED ORDER — AMLODIPINE BESYLATE 5 MG PO TABS
5.0000 mg | ORAL_TABLET | Freq: Every day | ORAL | Status: DC
  Administered 2022-09-15 – 2022-09-17 (×3): 5 mg via ORAL
  Filled 2022-09-15 (×3): qty 1

## 2022-09-15 MED ORDER — MORPHINE SULFATE (PF) 0.5 MG/ML IJ SOLN
INTRAMUSCULAR | Status: DC | PRN
  Administered 2022-09-15: .15 mg via INTRATHECAL

## 2022-09-15 MED ORDER — CEFAZOLIN IN SODIUM CHLORIDE 3-0.9 GM/100ML-% IV SOLN
INTRAVENOUS | Status: AC
  Filled 2022-09-15: qty 100

## 2022-09-15 MED ORDER — KETOROLAC TROMETHAMINE 30 MG/ML IJ SOLN
30.0000 mg | Freq: Four times a day (QID) | INTRAMUSCULAR | Status: AC | PRN
  Administered 2022-09-15: 30 mg via INTRAMUSCULAR

## 2022-09-15 MED ORDER — LIDOCAINE-EPINEPHRINE (PF) 2 %-1:200000 IJ SOLN
INTRAMUSCULAR | Status: AC
  Filled 2022-09-15: qty 20

## 2022-09-15 MED ORDER — SCOPOLAMINE 1 MG/3DAYS TD PT72
1.0000 | MEDICATED_PATCH | Freq: Once | TRANSDERMAL | Status: DC
  Administered 2022-09-15: 1.5 mg via TRANSDERMAL
  Filled 2022-09-15: qty 1

## 2022-09-15 MED ORDER — ENOXAPARIN SODIUM 100 MG/ML IJ SOSY
0.5000 mg/kg | PREFILLED_SYRINGE | INTRAMUSCULAR | Status: DC
  Administered 2022-09-16 – 2022-09-17 (×2): 90 mg via SUBCUTANEOUS
  Filled 2022-09-15 (×2): qty 0.9

## 2022-09-15 MED ORDER — SIMETHICONE 80 MG PO CHEW: 80.0000 mg | CHEWABLE_TABLET | ORAL | Status: DC | PRN

## 2022-09-15 MED ORDER — OXYTOCIN-SODIUM CHLORIDE 30-0.9 UT/500ML-% IV SOLN
INTRAVENOUS | Status: DC | PRN
  Administered 2022-09-15: 300 mL via INTRAVENOUS

## 2022-09-15 MED ORDER — GABAPENTIN 100 MG PO CAPS
200.0000 mg | ORAL_CAPSULE | Freq: Two times a day (BID) | ORAL | Status: DC
  Administered 2022-09-15 – 2022-09-17 (×4): 200 mg via ORAL
  Filled 2022-09-15 (×5): qty 2

## 2022-09-15 MED ORDER — IBUPROFEN 600 MG PO TABS
600.0000 mg | ORAL_TABLET | Freq: Four times a day (QID) | ORAL | Status: DC
  Administered 2022-09-15 – 2022-09-17 (×7): 600 mg via ORAL
  Filled 2022-09-15 (×7): qty 1

## 2022-09-15 MED ORDER — FENTANYL CITRATE (PF) 100 MCG/2ML IJ SOLN
INTRAMUSCULAR | Status: AC
  Filled 2022-09-15: qty 2

## 2022-09-15 MED ORDER — TETANUS-DIPHTH-ACELL PERTUSSIS 5-2.5-18.5 LF-MCG/0.5 IM SUSY
0.5000 mL | PREFILLED_SYRINGE | Freq: Once | INTRAMUSCULAR | Status: AC
  Administered 2022-09-16: 0.5 mL via INTRAMUSCULAR
  Filled 2022-09-15: qty 0.5

## 2022-09-15 MED ORDER — MORPHINE SULFATE (PF) 0.5 MG/ML IJ SOLN
INTRAMUSCULAR | Status: AC
  Filled 2022-09-15: qty 10

## 2022-09-15 MED ORDER — POLYETHYLENE GLYCOL 3350 17 G PO PACK
17.0000 g | PACK | Freq: Every day | ORAL | Status: DC
  Administered 2022-09-16: 17 g via ORAL
  Filled 2022-09-15 (×2): qty 1

## 2022-09-15 MED ORDER — DIBUCAINE (PERIANAL) 1 % EX OINT: 1.0000 | TOPICAL_OINTMENT | CUTANEOUS | Status: DC | PRN

## 2022-09-15 MED ORDER — FUROSEMIDE 20 MG PO TABS
20.0000 mg | ORAL_TABLET | Freq: Every day | ORAL | Status: DC
  Administered 2022-09-15 – 2022-09-17 (×3): 20 mg via ORAL
  Filled 2022-09-15 (×3): qty 1

## 2022-09-15 MED ORDER — ONDANSETRON HCL 4 MG/2ML IJ SOLN
INTRAMUSCULAR | Status: DC | PRN
  Administered 2022-09-15 (×2): 4 mg via INTRAVENOUS

## 2022-09-15 MED ORDER — DEXAMETHASONE SODIUM PHOSPHATE 10 MG/ML IJ SOLN
INTRAMUSCULAR | Status: AC
  Filled 2022-09-15: qty 1

## 2022-09-15 MED ORDER — FENTANYL CITRATE (PF) 100 MCG/2ML IJ SOLN
INTRAMUSCULAR | Status: DC | PRN
  Administered 2022-09-15: 15 ug via INTRATHECAL

## 2022-09-15 MED ORDER — LACTATED RINGERS IV SOLN: Freq: Once | INTRAVENOUS | Status: AC

## 2022-09-15 MED ORDER — KETOROLAC TROMETHAMINE 30 MG/ML IJ SOLN: 30.0000 mg | Freq: Four times a day (QID) | INTRAMUSCULAR | Status: AC | PRN

## 2022-09-15 MED ORDER — PRENATAL MULTIVITAMIN CH
1.0000 | ORAL_TABLET | Freq: Every day | ORAL | Status: DC
  Administered 2022-09-16 – 2022-09-17 (×2): 1 via ORAL
  Filled 2022-09-15 (×2): qty 1

## 2022-09-15 MED ORDER — PHENYLEPHRINE HCL-NACL 20-0.9 MG/250ML-% IV SOLN
INTRAVENOUS | Status: DC | PRN
  Administered 2022-09-15: 60 ug/min via INTRAVENOUS

## 2022-09-15 MED ORDER — DIPHENHYDRAMINE HCL 25 MG PO CAPS: 25.0000 mg | ORAL_CAPSULE | Freq: Four times a day (QID) | ORAL | Status: DC | PRN

## 2022-09-15 MED ORDER — SOD CITRATE-CITRIC ACID 500-334 MG/5ML PO SOLN
ORAL | Status: AC
  Filled 2022-09-15: qty 30

## 2022-09-15 MED ORDER — ONDANSETRON HCL 4 MG/2ML IJ SOLN: 4.0000 mg | Freq: Three times a day (TID) | INTRAMUSCULAR | Status: DC | PRN

## 2022-09-15 MED ORDER — EPINEPHRINE 1 MG/10ML IJ SOSY
PREFILLED_SYRINGE | INTRAMUSCULAR | Status: AC
  Filled 2022-09-15: qty 10

## 2022-09-15 MED ORDER — PHENYLEPHRINE HCL-NACL 20-0.9 MG/250ML-% IV SOLN
INTRAVENOUS | Status: AC
  Filled 2022-09-15: qty 250

## 2022-09-15 MED ORDER — ACETAMINOPHEN 500 MG PO TABS
1000.0000 mg | ORAL_TABLET | Freq: Four times a day (QID) | ORAL | Status: AC
  Administered 2022-09-15 – 2022-09-16 (×4): 1000 mg via ORAL
  Filled 2022-09-15 (×4): qty 2

## 2022-09-15 MED ORDER — EPHEDRINE SULFATE-NACL 50-0.9 MG/10ML-% IV SOSY
PREFILLED_SYRINGE | INTRAVENOUS | Status: DC | PRN
  Administered 2022-09-15: 5 mg via INTRAVENOUS
  Administered 2022-09-15: 15 mg via INTRAVENOUS
  Administered 2022-09-15: 5 mg via INTRAVENOUS

## 2022-09-15 SURGICAL SUPPLY — 34 items
APL SKNCLS STERI-STRIP NONHPOA (GAUZE/BANDAGES/DRESSINGS) ×1
BENZOIN TINCTURE PRP APPL 2/3 (GAUZE/BANDAGES/DRESSINGS) ×1 IMPLANT
CANISTER PREVENA PLUS 150 (CANNISTER) IMPLANT
CANISTER SUCT 3000ML PPV (MISCELLANEOUS) ×1 IMPLANT
CHLORAPREP W/TINT 26ML (MISCELLANEOUS) ×2 IMPLANT
CLAMP UMBILICAL CORD (MISCELLANEOUS) ×1 IMPLANT
DRESSING PREVENA PLUS CUSTOM (GAUZE/BANDAGES/DRESSINGS) IMPLANT
DRSG OPSITE POSTOP 4X10 (GAUZE/BANDAGES/DRESSINGS) ×1 IMPLANT
DRSG PREVENA PLUS CUSTOM (GAUZE/BANDAGES/DRESSINGS) ×1
ELECT REM PT RETURN 9FT ADLT (ELECTROSURGICAL) ×1
ELECTRODE REM PT RTRN 9FT ADLT (ELECTROSURGICAL) ×1 IMPLANT
EXTRACTOR VACUUM KIWI (MISCELLANEOUS) ×1 IMPLANT
GLOVE BIOGEL PI IND STRL 7.0 (GLOVE) ×2 IMPLANT
GLOVE BIOGEL PI IND STRL 7.5 (GLOVE) ×1 IMPLANT
GLOVE SKINSENSE STRL SZ7.0 (GLOVE) ×1 IMPLANT
GOWN STRL REUS W/ TWL LRG LVL3 (GOWN DISPOSABLE) ×2 IMPLANT
GOWN STRL REUS W/ TWL XL LVL3 (GOWN DISPOSABLE) ×1 IMPLANT
GOWN STRL REUS W/TWL LRG LVL3 (GOWN DISPOSABLE) ×2
GOWN STRL REUS W/TWL XL LVL3 (GOWN DISPOSABLE) ×1
NS IRRIG 1000ML POUR BTL (IV SOLUTION) ×1 IMPLANT
PACK C SECTION WH (CUSTOM PROCEDURE TRAY) ×1 IMPLANT
PAD ABD 7.5X8 STRL (GAUZE/BANDAGES/DRESSINGS) ×1 IMPLANT
PAD OB MATERNITY 4.3X12.25 (PERSONAL CARE ITEMS) ×1 IMPLANT
PAD PREP 24X48 CUFFED NSTRL (MISCELLANEOUS) ×1 IMPLANT
STRIP CLOSURE SKIN 1/2X4 (GAUZE/BANDAGES/DRESSINGS) ×1 IMPLANT
SUT MNCRL 0 VIOLET CTX 36 (SUTURE) ×2 IMPLANT
SUT MON AB 4-0 PS1 27 (SUTURE) ×1 IMPLANT
SUT MONOCRYL 0 CTX 36 (SUTURE) ×2
SUT PLAIN 2 0 XLH (SUTURE) ×1 IMPLANT
SUT VIC AB 0 CT1 36 (SUTURE) ×2 IMPLANT
SUT VIC AB 3-0 CT1 27 (SUTURE) ×1
SUT VIC AB 3-0 CT1 TAPERPNT 27 (SUTURE) ×1 IMPLANT
TOWEL OR 17X24 6PK STRL BLUE (TOWEL DISPOSABLE) ×2 IMPLANT
WATER STERILE IRR 1000ML POUR (IV SOLUTION) ×1 IMPLANT

## 2022-09-15 NOTE — Anesthesia Procedure Notes (Signed)
Spinal  Patient location during procedure: OR Start time: 09/15/2022 11:10 AM End time: 09/15/2022 11:15 AM Reason for block: surgical anesthesia Staffing Performed: anesthesiologist  Anesthesiologist: Freddrick March, MD Performed by: Freddrick March, MD Authorized by: Freddrick March, MD   Preanesthetic Checklist Completed: patient identified, IV checked, risks and benefits discussed, surgical consent, monitors and equipment checked, pre-op evaluation and timeout performed Spinal Block Patient position: sitting Prep: DuraPrep and site prepped and draped Patient monitoring: cardiac monitor, continuous pulse ox and blood pressure Approach: midline Location: L3-4 Injection technique: single-shot Needle Needle type: Tuohy and Sprotte  Needle gauge: 24 G Needle length: 9 cm Assessment Sensory level: T6 Events: CSF return Additional Notes Functioning IV was confirmed and monitors were applied. Sterile prep and drape, including hand hygiene and sterile gloves were used. The patient was positioned and the spine was prepped. The skin was anesthetized with lidocaine.  Free flow of clear CSF was obtained prior to injecting local anesthetic into the CSF.  The spinal needle aspirated freely following injection.  The needle was carefully withdrawn.  The patient tolerated the procedure well.   2 attempts. First attempt at L3/4 with introducer and 24G Pencan. Second attempt at L3/4 with Tuohy. LOR at 10cm. Dural puncture with 24G Sprotte.

## 2022-09-15 NOTE — Lactation Note (Signed)
This note was copied from a baby's chart. Lactation Consultation Note  Patient Name: Brandy Brock [id number] Date: 09/15/2022 Reason for consult: Initial assessment;Early term 37-38.6wks Age:26 hours   P2: Early term infant at 37+3 weeks Feeding preference: Breast  Mother had "Brandy Brock" swaddled and covered with additional blankets when I arrived; attempting to latch.  Offered to assist with waking and latching; mother receptive.  Suggested she remove blankets prior to latching; changed a large void and then assisted to latch after a few repeated attempts.  Once at the breast, "Brandy Brock" was not interested in taking more than a few sucks.  Demonstrated breast compression and gentle stimulation.  Encouraged to feed at least every three hours or soon if "Brandy Brock" shows feeding cues.  Mother will call her RN/LC for latch assistance as needed.  She plans to call the [facility] department tomorrow.  She is currently receiving WIC benefits with her 47 month old son.  Visitor present.   Maternal Data Has patient been taught Hand Expression?: Yes Does the patient have breastfeeding experience prior to this delivery?: Yes How long did the patient breastfeed?: A few weeks but not immediately after birth  Feeding Mother's Current Feeding Choice: Breast Milk  LATCH Score Latch: Repeated attempts needed to sustain latch, nipple held in mouth throughout feeding, stimulation needed to elicit sucking reflex.  Audible Swallowing: None  Type of Nipple: Everted at rest and after stimulation  Comfort (Breast/Nipple): Soft / non-tender  Hold (Positioning): Assistance needed to correctly position infant at breast and maintain latch.  LATCH Score: 6   Lactation Tools Discussed/Used    Interventions Interventions: Breast feeding basics reviewed;Assisted with latch;Skin to skin;Breast massage;Hand express;Support pillows;Education;LC Services brochure  Discharge Pump: WIC Loaner (Will plan to obtain a WIC pump if  needed) WIC Program: Yes  Consult Status Consult Status: Follow-up Date: 09/16/22 Follow-up type: In-patient    Mindel Friscia R Adelfa Lozito 09/15/2022, 4:14 PM

## 2022-09-15 NOTE — Progress Notes (Signed)
Pharmacy Consult for Lovenox Dosing  Pharmacy was requested to help dose lovenox for VTE prophylaxis in this postpartum (s/p C-section) obese pt.  Weight 180.8 kg Scr 0.53  Will dose lovenox at 90 mg (0.'5mg'$ /kg) SQ daily starting 11/6 at 0800.  Thank you for the consult.  Emilio Math, RPh

## 2022-09-15 NOTE — Discharge Summary (Addendum)
Postpartum Discharge Summary  Date of Service updated 09/17/22     Patient Name: Brandy Brock DOB: [birth year] MRN: [id number]  Date of admission: 09/15/2022 Delivery date:09/15/2022  Delivering provider: Aletha Halim  Date of discharge: 09/17/2022  Admitting diagnosis: Gestational hypertension [O13.9] Intrauterine pregnancy: [redacted]w[redacted]d    Secondary diagnosis:  Principal Problem:   Status post repeat low transverse cesarean section Active Problems:   Chronic hypertension affecting pregnancy   Supervision of high risk pregnancy, antepartum   Obesity in pregnancy   History of postpartum dehiscence of wound   History of syphilis  Additional problems: Anemia in pregnancy    Discharge diagnosis: Term Pregnancy Delivered and CHTN                                              Post partum procedures: none Augmentation: N/A Complications: None  Hospital course: Sceduled C/S   26y.o. yo G2P1001 at 344w3das admitted to the hospital 09/15/2022 for scheduled cesarean section with the following indication:Elective Repeat.Delivery details are as follows:  Membrane Rupture Time/Date: 12:00 PM ,09/15/2022   Delivery Method:C-Section, Low Transverse  Details of operation can be found in separate operative note.  Patient had a postpartum course complicated by anemia. Hgb drop from 11.3 to 9.2 and oral Fe was started.  She is ambulating, tolerating a regular diet, passing flatus, and urinating well. Patient is discharged home in stable condition on  09/17/22        Newborn Data: Birth date:09/15/2022  Birth time:12:01 PM  Gender:Female  Living status:Living  Apgars:8 ,9  Weight:3150 g     Magnesium Sulfate received: No BMZ received: No Rhophylac:N/A MMR:N/A; Rubella Immune T-DaP: no Flu: No Transfusion:No  Physical exam  Vitals:   09/16/22 1441 09/16/22 1515 09/16/22 2031 09/17/22 0545  BP: (!) 145/71 125/60 132/73 129/65  Pulse: 83  86 70  Resp: _0 Temp: 98.5 F (36.9  C)  98.3 F (36.8 C) 98 F (36.7 C)  TempSrc: Oral  Oral   SpO2: 100%  98% 98%   General: alert Lochia: appropriate Uterine Fundus: firm Incision: Healing well with no significant drainage, No significant erythema, Dressing is clean, dry, and intact, Prevena in place DVT Evaluation: No evidence of DVT seen on physical exam. Negative Homan's sign. No cords or calf tenderness. No significant calf/ankle edema. Labs: Lab Results  Component Value Date   WBC 15.1 (H) 09/16/2022   HGB 9.2 (L) 09/16/2022   HCT 28.1 (L) 09/16/2022   MCV 90.9 09/16/2022   PLT 164 09/16/2022      Latest Ref Rng & Units 09/15/2022    9:40 AM  CMP  Glucose 70 - 99 mg/dL 78   BUN 6 - 20 mg/dL <5   Creatinine 0.44 - 1.00 mg/dL 0.53   Sodium 135 - 145 mmol/L 134   Potassium 3.5 - 5.1 mmol/L 4.0   Chloride 98 - 111 mmol/L 103   CO2 22 - 32 mmol/L 21   Calcium 8.9 - 10.3 mg/dL 8.8   Total Protein 6.5 - 8.1 g/dL 6.1   Total Bilirubin 0.3 - 1.2 mg/dL 0.6   Alkaline Phos 38 - 126 U/L 69   AST 15 - 41 U/L 18   ALT 0 - 44 U/L 11    Edinburgh Score:    09/16/2022  9:55 AM  Edinburgh Postnatal Depression Scale Screening Tool  I have been able to laugh and see the funny side of things. 0  I have looked forward with enjoyment to things. 0  I have blamed myself unnecessarily when things went wrong. 1  I have been anxious or worried for no good reason. 1  I have felt scared or panicky for no good reason. 1  Things have been getting on top of me. 1  I have been so unhappy that I have had difficulty sleeping. 1  I have felt sad or miserable. 1  I have been so unhappy that I have been crying. 1  The thought of harming myself has occurred to me. 0  Edinburgh Postnatal Depression Scale Total 7     After visit meds:  Allergies as of 09/17/2022       Reactions   Peanut-containing Drug Products Itching, Swelling   Facial itching and swelling.        Medication List     STOP taking these  medications    aspirin EC 81 MG tablet       TAKE these medications    albuterol 108 (90 Base) MCG/ACT inhaler Commonly known as: VENTOLIN HFA Inhale 2 puffs into the lungs every 6 (six) hours as needed for wheezing or shortness of breath. What changed: when to take this   amLODipine 5 MG tablet Commonly known as: NORVASC Take 1 tablet (5 mg total) by mouth daily.   furosemide 20 MG tablet Commonly known as: LASIX Take 1 tablet (20 mg total) by mouth daily.   gabapentin 100 MG capsule Commonly known as: NEURONTIN Take 1 capsule (100 mg total) by mouth 3 (three) times daily.   ibuprofen 600 MG tablet Commonly known as: ADVIL Take 1 tablet (600 mg total) by mouth every 6 (six) hours.   multivitamin-prenatal 27-0.8 MG Tabs tablet Take 1 tablet by mouth daily at 12 noon.   oxyCODONE 5 MG immediate release tablet Commonly known as: Oxy IR/ROXICODONE Take 1-2 tablets (5-10 mg total) by mouth every 4 (four) hours as needed for moderate pain.        Discharge home in stable condition Infant Feeding: Breast Infant Disposition:home with mother Discharge instruction: per After Visit Summary and Postpartum booklet. Activity: Advance as tolerated. Pelvic rest for 6 weeks.  Diet: routine diet Future Appointments: Future Appointments  Date Time Provider Rote  09/23/2022  2:00 PM [facility] NURSE [facility]  10/23/2022 10:35 AM Griffin Basil, MD [facility]   Follow up Visit:  The following message was sent to [facility] by Mikki Santee, MD  Please schedule this patient for a In person postpartum visit in 4 weeks with the following provider: MD Additional Postpartum F/U:Incision check 1 week and BP check 1 week;' may need a repeat incision check by week 2, given history of wound dehiscence during last C-section High risk pregnancy complicated by:  cHTN, obesity, short interpregnancy interval Delivery mode:  C-Section, Low Transverse  Anticipated Birth Control:   IUD in office   09/17/2022 Julianne Handler, CNM

## 2022-09-15 NOTE — Op Note (Signed)
Operative Note   SURGERY DATE: 09/15/2022  PRE-OP DIAGNOSIS:  *Pregnancy at 37/3 *History of prior cesarean section and desire for repeat *Chronic hypertension needing medications *BMI 60s *Remote history of syphilis.   POST-OP DIAGNOSIS: Same   PROCEDURE: Repeat low transverse cesarean section via pfannenstiel skin incision with double layer uterine closure. Prevena placement  SURGEON: Surgeon(s) and Role:    * Aletha Halim, MD - Primary  ASSISTANT:    * Ndulue, Lyndel Safe, MD - Fellow  An experienced assistant was required given the standard of surgical care given the complexity of the case.  This assistant was needed for exposure, dissection, suctioning, retraction, instrument exchange, assisting with delivery with administration of fundal pressure, and for overall help during the procedure.  ANESTHESIA: spinal  ESTIMATED BLOOD LOSS:  66m  DRAINS: 1046mUOP via indwelling foley  TOTAL IV FLUIDS: per anesthesia note  VTE PROPHYLAXIS: SCDs to bilateral lower extremities  ANTIBIOTICS: Three grams of Cefazolin were given., within 1 hour of skin incision  SPECIMENS: placenta to pathology (h/o syphilis)  COMPLICATIONS: None  FINDINGS: No intra-abdominal adhesions were noted. Grossly normal uterus, tubes and ovaries. Clear amniotic fluid, cephalic, female infant, weight 3150gm, APGARs 8/9, intact placenta.  PROCEDURE IN DETAIL: The patient was taken to the operating room where anesthesia was administered and normal fetal heart tones were confirmed. She was then prepped and draped in the normal fashion in the dorsal supine position with a leftward tilt.  After a time out was performed, a pfannensteil skin incision was made with the scalpel and carried through to the underlying layer of fascia. The fascia was then incised at the midline and this incision was extended laterally with the mayo scissors. Attention was turned to the superior aspect of the fascial incision which was  grasped with the kocher clamps x 2, tented up and the rectus muscles were dissected off with the scalpel. In a similar fashion the inferior aspect of the fascial incision was grasped with the kocher clamps, tented up and the rectus muscles dissected off with the mayo scissors. The rectus muscles were then separated in the midline and the peritoneum was entered bluntly. The bladder blade was inserted and the vesicouterine peritoneum was identified, tented up and entered with the metzenbaum scissors. This incision was extended laterally and the bladder flap was created digitally. The bladder blade was reinserted.  A low transverse hysterotomy was made with the scalpel until the endometrial cavity was breached and the amniotic sac ruptured with the Allis clamp, yielding clear amniotic fluid. This incision was extended bluntly and the infant's head, shoulders and body were delivered atraumatically.The cord was clamped x 2 and cut, and the infant was handed to the awaiting pediatricians, after delayed cord clamping was done.  The placenta was then gradually expressed from the uterus and then the uterus was exteriorized and cleared of all clots and debris. The hysterotomy was repaired with a running suture of 1-0 monocryl. A second imbricating layer of 1-0 monocryl suture was then placed to achieve excellent hemostasis.   The uterus and adnexa were then returned to the abdomen, and the hysterotomy and all operative sites were reinspected and excellent hemostasis was noted after irrigation and suction of the abdomen with warm saline.  The peritoneum was closed with a running stitch of 3-0 Vicryl. The fascia was reapproximated with 0 Vicryl in a simple running fashion bilaterally. The subcutaneous layer was then reapproximated with interrupted sutures of 2-0 plain gut, and the skin was then  closed with 4-0 monocryl, in a subcuticular fashion. Prevena device then placed  The patient  tolerated the procedure well.  Sponge, lap, needle, and instrument counts were correct x 2. The patient was transferred to the recovery room awake, alert and breathing independently in stable condition.  Durene Romans MD Attending Center for [facility])

## 2022-09-15 NOTE — Anesthesia Postprocedure Evaluation (Deleted)
Anesthesia Post Note  Patient: Brandy Brock  Procedure(s) Performed: CESAREAN SECTION     Patient location during evaluation: PACU Anesthesia Type: Spinal Level of consciousness: oriented and awake and alert Pain management: pain level controlled Vital Signs Assessment: post-procedure vital signs reviewed and stable Respiratory status: spontaneous breathing, respiratory function stable and patient connected to nasal cannula oxygen Cardiovascular status: blood pressure returned to baseline and stable Postop Assessment: no headache, no backache and no apparent nausea or vomiting Anesthetic complications: no  No notable events documented.  Last Vitals:  Vitals:   09/15/22 1432 09/15/22 1457  BP: 134/80   Pulse: 75   Resp: (!) 23   Temp:  36.9 C  SpO2: 97%     Last Pain:  Vitals:   09/15/22 1457  TempSrc: Axillary   Pain Goal:                   Maudean Hoffmann L Jathan Balling

## 2022-09-15 NOTE — Anesthesia Postprocedure Evaluation (Signed)
Anesthesia Post Note  Patient: Brandy Brock  Procedure(s) Performed: CESAREAN SECTION     Patient location during evaluation: PACU Anesthesia Type: Spinal Level of consciousness: oriented and awake and alert Pain management: pain level controlled Vital Signs Assessment: post-procedure vital signs reviewed and stable Respiratory status: spontaneous breathing, respiratory function stable and patient connected to nasal cannula oxygen Cardiovascular status: blood pressure returned to baseline and stable Postop Assessment: no headache, no backache and no apparent nausea or vomiting Anesthetic complications: no  No notable events documented.  Last Vitals:  Vitals:   09/15/22 1432 09/15/22 1457  BP: 134/80   Pulse: 75   Resp: (!) 23   Temp:  36.9 C  SpO2: 97%     Last Pain:  Vitals:   09/15/22 1457  TempSrc: Axillary   Pain Goal:                   Hovanes Hymas L Necia Kamm

## 2022-09-15 NOTE — Transfer of Care (Signed)
Immediate Anesthesia Transfer of Care Note  Patient: Brandy Brock  Procedure(s) Performed: CESAREAN SECTION  Patient Location: PACU  Anesthesia Type:Spinal  Level of Consciousness: awake, alert , and oriented  Airway & Oxygen Therapy: Patient Spontanous Breathing  Post-op Assessment: Report given to RN and Post -op Vital signs reviewed and stable  Post vital signs: Reviewed and stable  Last Vitals:  Vitals Value Taken Time  BP 131/59 09/15/22 1330  Temp 36.6 C 09/15/22 1330  Pulse 75 09/15/22 1336  Resp 17 09/15/22 1336  SpO2 97 % 09/15/22 1336  Vitals shown include unvalidated device data.  Last Pain:  Vitals:   09/15/22 1330  TempSrc: Oral         Complications: No notable events documented.

## 2022-09-15 NOTE — Anesthesia Preprocedure Evaluation (Signed)
Anesthesia Evaluation  Patient identified by MRN, date of birth, ID band Patient awake    Reviewed: Allergy & Precautions, NPO status , Patient's Chart, lab work & pertinent test results  Airway Mallampati: III  TM Distance: >3 FB Neck ROM: Full    Dental no notable dental hx.    Pulmonary asthma    Pulmonary exam normal breath sounds clear to auscultation       Cardiovascular hypertension, Normal cardiovascular exam Rhythm:Regular Rate:Normal     Neuro/Psych negative neurological ROS  negative psych ROS   GI/Hepatic negative GI ROS, Neg liver ROS,,,  Endo/Other    Morbid obesity (BMI 64)  Renal/GU negative Renal ROS  negative genitourinary   Musculoskeletal negative musculoskeletal ROS (+)    Abdominal   Peds  Hematology negative hematology ROS (+)   Anesthesia Other Findings Repeat C/S  Reproductive/Obstetrics (+) Pregnancy                             Anesthesia Physical Anesthesia Plan  ASA: 3  Anesthesia Plan: Spinal   Post-op Pain Management:    Induction:   PONV Risk Score and Plan: Treatment may vary due to age or medical condition  Airway Management Planned: Natural Airway  Additional Equipment:   Intra-op Plan:   Post-operative Plan:   Informed Consent: I have reviewed the patients History and Physical, chart, labs and discussed the procedure including the risks, benefits and alternatives for the proposed anesthesia with the patient or authorized representative who has indicated his/her understanding and acceptance.     Dental advisory given  Plan Discussed with: CRNA  Anesthesia Plan Comments:        Anesthesia Quick Evaluation

## 2022-09-15 NOTE — H&P (Signed)
Obstetrics Admission History & Physical  09/15/2022 - 10:32 AM Primary OBGYN: [facility]  Chief Complaint: scheduled repeat c-section for CHTN History of Present Illness  26 y.o. G2P1001 @ [redacted]w[redacted]d with the above CC. Pregnancy complicated by: BMI 622E short interval pregnancy, h/o postop SSI, remote h/o syphils. .  Ms. Brandy PERAGINEstates that she has no complaints or issues today  Review of Systems: as noted in the History of Present Illness.  Patient Active Problem List   Diagnosis Date Noted   Chronic hypertension during pregnancy, antepartum 09/14/2022   Round ligament pain 08/01/2022   Obesity in pregnancy 04/22/2022   Short interval between pregnancies affecting pregnancy, antepartum 04/22/2022   History of postpartum dehiscence of wound 04/22/2022   History of syphilis 04/22/2022   Supervision of high risk pregnancy, antepartum 03/26/2022   History of low transverse cesarean section 04/30/2021   LGSIL on Pap smear of cervix 10/26/2020   Chronic hypertension affecting pregnancy    BMI 60.0-69.9, adult ([place] 10/13/2008   Asthma 10/13/2008     PMHx:  Past Medical History:  Diagnosis Date   Asthma    childhood   Chlamydia    Elevated liver function tests 12/11/2020   Elevated in MAU on 1/7 - repeat beginning of February    Hypertension    LGSIL of cervix of undetermined significance    Pregnancy induced hypertension    Syphilis affecting pregnancy    PSHx:  Past Surgical History:  Procedure Laterality Date   CESAREAN SECTION N/A 03/17/2021   Procedure: CESAREAN SECTION;  Surgeon: PAletha Halim MD;  Location: [facility] LD [facility]S;  Service: Obstetrics;  Laterality: N/A;   MULTIPLE EXTRACTIONS WITH ALVEOLOPLASTY N/A 10/11/2016   Procedure: MULTIPLE EXTRACTION WITH ALVEOLOPLASTY;  Surgeon: SDiona Browner DDS;  [facility]  Service: Oral Surgery;  Laterality: N/A;   Medications:  Medications Prior to Admission  Medication Sig  Dispense Refill Last Dose   albuterol (VENTOLIN HFA) 108 (90 Base) MCG/ACT inhaler Inhale 2 puffs into the lungs every 6 (six) hours as needed for wheezing or shortness of breath. 8 g 2    aspirin EC 81 MG tablet Take 81 mg by mouth daily. Swallow whole.      Blood Pressure Monitoring (BLOOD PRESSURE KIT) DEVI 1 Device by Does not apply route as needed. 1 each 0    Misc. Devices (GOJJI WEIGHT SCALE) MISC 1 Device by Does not apply route as needed. 1 each 0    Prenatal Vit-Fe Fumarate-FA (MULTIVITAMIN-PRENATAL) 27-0.8 MG TABS tablet Take 1 tablet by mouth daily at 12 noon.        Allergies: is allergic to peanut-containing drug products. OBHx:  OB History  Gravida Para Term Preterm AB Living  _0 0 0 1  SAB IAB Ectopic Multiple Live Births  0 0 0 0 1    # Outcome Date GA Lbr Len/2nd Weight Sex Delivery Anes PTL Lv  2 Current           1 Term 03/17/21 [id number] g M CS-LTranv Spinal  LIV     Birth Comments: WNL         FHx:  Family History  Problem Relation Age of Onset   Diabetes Father    Diabetes Brother    Soc Hx:  Social History   Socioeconomic History   Marital status: Single    Spouse name: CuVicente Serener   Number of children: Not on file   Years  of education: Not on file   Highest education level: Not on file  Occupational History   Not on file  Tobacco Use   Smoking status: Never   Smokeless tobacco: Former  Scientific laboratory technician Use: Never used  Substance and Sexual Activity   Alcohol use: Not Currently   Drug use: No   Sexual activity: Yes    Birth control/protection: None  Other Topics Concern   Not on file  Social History Narrative   Not on file   Social Determinants of Health   Financial Resource Strain: Not on file  Food Insecurity: No Food Insecurity (04/22/2022)   Hunger Vital Sign    Worried About Running Out of Food in the Last Year: Never true    Ran Out of Food in the Last Year: Never true  Transportation Needs: No Transportation Needs  (04/22/2022)   PRAPARE - Hydrologist (Medical): No    Lack of Transportation (Non-Medical): No  Physical Activity: Not on file  Stress: Not on file  Social Connections: Not on file  Intimate Partner Violence: Not on file    Objective    Current Vital Signs 24h Vital Sign Ranges  T   No data recorded  BP (!) 149/89 BP  Min: 149/89  Max: 155/99  HR 74 Pulse  Avg: 75.5  Min: 74  Max: 77  RR 18 Resp  Avg: 18  Min: 18  Max: 18  SaO2 98 %  (room air) SpO2  Avg: 98 %  Min: 98 %  Max: 98 %       24 Hour I/O Current Shift I/O  Time Ins Outs No intake/output data recorded. No intake/output data recorded.   EFM: 130s   General: Well nourished, well developed female in no acute distress.  Skin:  Warm and dry.  Cardiovascular: S1, S2 normal, no murmur, rub or gallop, regular rate and rhythm Respiratory:  Clear to auscultation bilateral. Normal respiratory effort Abdomen: obese, nttp Neuro/Psych:  Normal mood and affect.   Labs  B POS  Recent Labs  Lab 09/13/22 1510 09/15/22 0940  WBC 12.8* 11.3*  HGB 11.3* 11.3*  HCT 33.1* 35.0*  PLT 173 165    Recent Labs  Lab 09/13/22 1510  NA 134*  K 4.2  CL 105  CO2 22  BUN <5*  CREATININE 0.45  CALCIUM 9.1  PROT 6.8  BILITOT 0.5  ALKPHOS 67  ALT 13  AST 19  GLUCOSE 88   PC ratio pending  Radiology 26/8: bpp 8/8, cephalic 34/19: 62%, 2297LG, ac 96%, afi 14, bpp 8/8, cephalic, placenta posterior  Assessment & Plan   26 y.o. G2P1001 @ 21w3dhere for scheduled rpt c-section; pt stable *Pregnancy: The risks of surgery were discussed in detail with the patient including but not limited to: bleeding which may require transfusion or reoperation; infection which may require prolonged hospitalization or re-hospitalization and antibiotic therapy; injury to bowel, bladder, ureters and major vessels or other surrounding organs which may lead to other procedures; formation of adhesions; need for additional  procedures including laparotomy or subsequent procedures secondary to intraoperative injury or abnormal pathology; thromboembolic phenomenon; incisional problems and other postoperative or anesthesia complications.  She is amenable to proceeding with repeat c-section and prevena placement. Pt thinking about PP visit IUD *CHTN: likely will need meds postpartum *Syphilis: placenta to pathology. Follow up admit RPRs. Pt treated late 2022. Pregnancy RPRs stable at 1:2, TPA+ Can proceed when OR  is ready  Durene Romans MD Attending Center for [facility])

## 2022-09-16 ENCOUNTER — Encounter (HOSPITAL_COMMUNITY): Payer: Self-pay | Admitting: Obstetrics and Gynecology

## 2022-09-16 LAB — CBC
HCT: 28.1 % — ABNORMAL LOW (ref 36.0–46.0)
Hemoglobin: 9.2 g/dL — ABNORMAL LOW (ref 12.0–15.0)
MCH: 29.8 pg (ref 26.0–34.0)
MCHC: 32.7 g/dL (ref 30.0–36.0)
MCV: 90.9 fL (ref 80.0–100.0)
Platelets: 164 10*3/uL (ref 150–400)
RBC: 3.09 MIL/uL — ABNORMAL LOW (ref 3.87–5.11)
RDW: 13.4 % (ref 11.5–15.5)
WBC: 15.1 10*3/uL — ABNORMAL HIGH (ref 4.0–10.5)
nRBC: 0 % (ref 0.0–0.2)

## 2022-09-16 LAB — CERVICOVAGINAL ANCILLARY ONLY
Chlamydia: NEGATIVE
Comment: NEGATIVE
Comment: NORMAL
Neisseria Gonorrhea: NEGATIVE

## 2022-09-16 LAB — RPR
RPR Ser Ql: REACTIVE — AB
RPR Titer: 1:2 {titer}

## 2022-09-16 LAB — T.PALLIDUM AB, TOTAL: T Pallidum Abs: REACTIVE — AB

## 2022-09-16 MED ORDER — ACETAMINOPHEN 325 MG PO TABS
650.0000 mg | ORAL_TABLET | ORAL | Status: DC | PRN
  Administered 2022-09-16 – 2022-09-17 (×2): 650 mg via ORAL
  Filled 2022-09-16 (×2): qty 2

## 2022-09-16 NOTE — Lactation Note (Signed)
This note was copied from a baby's chart. Lactation Consultation Note  Patient Name: Brandy Brock FVCBS'W Date: 09/16/2022 Reason for consult: Follow-up assessment;Early term 37-38.6wks Age:26 hours   P2: Early term infant at 37+3 weeks Feeding preference; Breast milk/formula Weight loss: 6%  "Brandy Brock" was able to latch to the breast yesterday for a few sucks only.  Mother is not interested in latching at this time and feels like her nipples are too large for him.  She is interested in providing formula and beginning to pump with the electric pump.  RN assisted with pump set up this morning; mother using a #30 flange with comfort.  Mother had no questions regarding pump parts, set up or cleaning.  Encouraged continued STS, breast massage and hand expression.  Due to gestational age, suggested mother feed at least every three hours or sooner if "Brandy Brock" shows feeding cues.  She has received a supplementation guideline sheet and "Brandy Brock" is consuming adequate volumes; voiding/stooling.  Mother will feed back any expressed colostrum to "Brandy Brock" and reminded her to feed her drops prior to formula.  She is aware to not mix colostrum and formula.    Mother was planning on following up with the [facility] department today regarding a loaner pump at discharge, however, she has not yet done this.  Offered to send a referral now in anticipation of a discharge home tomorrow.  Mother appreciative and fax sent.  Visitor present.  RN updated.   Maternal Data Has patient been taught Hand Expression?: Yes Does the patient have breastfeeding experience prior to this delivery?: Yes How long did the patient breastfeed?: A few weeks  Feeding Mother's Current Feeding Choice: Breast Milk and Formula Nipple Type: Slow - flow  LATCH Score                    Lactation Tools Discussed/Used Tools: Pump;Flanges Flange Size: 30 Breast pump type: Double-Electric Breast Pump;Manual Pump Education: Setup,  frequency, and cleaning;Milk Storage (RN set up; no review needed) Reason for Pumping: Breast stimulation for supplementation Pumping frequency: Every three hours  Interventions Interventions: Education  Discharge Pump: WIC Loaner (Mother was planning on calling the [facility] department today, however, she has not yet followed up.  Informed her I will fax a referral for her now in preparation for a discharge tomorrow) [place] Program: Yes  Consult Status Consult Status: Follow-up Date: 09/17/22 Follow-up type: In-patient    Lalani Winkles R Ladasha Schnackenberg 09/16/2022, 1:31 PM

## 2022-09-16 NOTE — Progress Notes (Signed)
POSTPARTUM PROGRESS NOTE  POD #1  Subjective:  Brandy Brock is a 26 y.o. F1Q1975 s/p rLTCS at [redacted]w[redacted]d  She reports she doing well. No acute events overnight. She reports she is doing well. She just had catheter taken out this morning, so yet to ambulate. Tolerating PO. Denies nausea or vomiting. She has yet to pass flatus. Pain is well controlled.  Lochia is adequate.  Objective: Blood pressure 127/72, pulse 76, temperature 98 F (36.7 C), temperature source Oral, resp. rate 18, SpO2 99 %, unknown if currently breastfeeding.  Physical Exam:  General: alert, cooperative and no distress Chest: no respiratory distress Heart:regular rate, distal pulses intact Abdomen: soft, nontender,  Uterine Fundus: firm, appropriately tender DVT Evaluation: No calf swelling or tenderness Extremities: no edema Skin: warm, dry; incision clean/dry/intact w/ prevena in place  Recent Labs    09/15/22 0940 09/16/22 0425  HGB 11.3* 9.2*  HCT 35.0* 28.1*    Assessment/Plan: TDEANNE BEDGOODis a 26y.o. [id number]/p rLTCS at 388w3dor elective repeat.  POD#1 - Doing welll; pain well controlled. H/H appropriate  Routine postpartum care  OOB, ambulated once with RN help  Lovenox for VTE prophylaxis Will work on ambulating more today and voiding trial. History of surgical site infection - with last C/section. Will discharge with prevena in place, plan for close follow up post partum for incision check.  Anemia: asymptomatic. Hgb 9.2  Start po ferrous sulfate every day  cHTN:  On amlodipine '5mg'$  daily and Furosemide '20mg'$  daily. Blood pressure well controlled  Contraception: IUD in the office Feeding: breast  Dispo: Plan for discharge in 1-2 days.   LOS: 1 day   ChLiliane ChannelD MPH OB Fellow, [place]1/04/2022

## 2022-09-17 LAB — CULTURE, BETA STREP (GROUP B ONLY): Strep Gp B Culture: NEGATIVE

## 2022-09-17 MED ORDER — FUROSEMIDE 20 MG PO TABS: 20.0000 mg | ORAL_TABLET | Freq: Every day | ORAL | 0 refills | Status: DC

## 2022-09-17 MED ORDER — IBUPROFEN 600 MG PO TABS: 600.0000 mg | ORAL_TABLET | Freq: Four times a day (QID) | ORAL | 0 refills | Status: DC

## 2022-09-17 MED ORDER — GABAPENTIN 100 MG PO CAPS: 100.0000 mg | ORAL_CAPSULE | Freq: Three times a day (TID) | ORAL | 0 refills | Status: DC

## 2022-09-17 MED ORDER — OXYCODONE HCL 5 MG PO TABS: 5.0000 mg | ORAL_TABLET | ORAL | 0 refills | Status: DC | PRN

## 2022-09-17 MED ORDER — AMLODIPINE BESYLATE 5 MG PO TABS: 5.0000 mg | ORAL_TABLET | Freq: Every day | ORAL | 1 refills | Status: DC

## 2022-09-18 LAB — SURGICAL PATHOLOGY

## 2022-09-19 ENCOUNTER — Telehealth: Payer: Self-pay | Admitting: Obstetrics and Gynecology

## 2022-09-19 DIAGNOSIS — Z8619 Personal history of other infectious and parasitic diseases: Secondary | ICD-10-CM

## 2022-09-19 NOTE — Telephone Encounter (Signed)
Patient L&D RPR stable at 1:2 and +TPA. RPRs stayed stable during her pregnancy with confirmed PCN treatment late last year, with negative MFM ultrasounds, but baby came back +RPR and +TPA; baby treated with PCN prior to discharge. Given this, recommend treatment again with PCN x 3. Note sent to clinic nurses.   November 2023: rpr 1:2, +tpa September 2023: 4p4 1:2 +tpa June 2023: rpr 1:2, +tpa May 2022: rpr 1:4, +tpa March 2022: rpr 1:2 +tpa  PCN @ [facility] #1 10/02/20 #2 10/09/20 #3 10/16/20  November 2021 @ [facility]: rpr 1:4, +tpa. No prior documentation in any system of an +rpr   Durene Romans MD Attending Center for [place] (Faculty Practice) 09/19/2022 Time: 1032am

## 2022-09-20 ENCOUNTER — Telehealth: Payer: Self-pay

## 2022-09-20 ENCOUNTER — Ambulatory Visit: Payer: Medicaid Other

## 2022-09-20 ENCOUNTER — Other Ambulatory Visit: Payer: Medicaid Other

## 2022-09-20 NOTE — Telephone Encounter (Addendum)
-----   Message from Aletha Halim, MD sent at 09/19/2022 10:24 AM EST ----- Regarding: need PCN treatment for syphilis B/c the baby came back positive for syphilis, tell her that I recommend the patient come in for three weekly PCN doses. Thanks  Called pt and reviewed provider recommendation. Pt will begin weekly treatment on Monday at scheduled nurse visit.

## 2022-09-23 ENCOUNTER — Other Ambulatory Visit: Payer: Self-pay

## 2022-09-23 ENCOUNTER — Encounter: Payer: Self-pay | Admitting: Obstetrics and Gynecology

## 2022-09-23 ENCOUNTER — Ambulatory Visit (INDEPENDENT_AMBULATORY_CARE_PROVIDER_SITE_OTHER): Payer: Medicaid Other | Admitting: *Deleted

## 2022-09-23 VITALS — BP 147/96 | HR 79 | Temp 98.9°F | Ht 66.0 in | Wt 389.7 lb

## 2022-09-23 DIAGNOSIS — A539 Syphilis, unspecified: Secondary | ICD-10-CM

## 2022-09-23 MED ORDER — PENICILLIN G BENZATHINE 1200000 UNIT/2ML IM SUSY
2.4000 10*6.[IU] | PREFILLED_SYRINGE | Freq: Once | INTRAMUSCULAR | Status: AC
  Administered 2022-09-23: 2.4 10*6.[IU] via INTRAMUSCULAR

## 2022-09-23 NOTE — Progress Notes (Signed)
Agree with nurses's documentation of this patient's clinic encounter.  Oliverio Cho L, MD  

## 2022-09-23 NOTE — Progress Notes (Signed)
Here for removal prevena wound vac, bp check and first retreatment for syphilis . BP elevated at 138/99, and repeat 147/96. At first she said she is not taking Norvasc, then said she maybe. She said she will send [place] a message once she gets home re: if she is taking her norvasc or not. We discussed she may need an increased dose.  Prevena wound vac was still in action and was turned off and then removed slowly. Bad odor noted before and during removal. Dressing completely saturated with bloody draninage. Wound intact except for about one inch area near patient left edge. Dr. Rip Harbour in to assess wound and apply silver nitrate. Wound care reviewed with patient and wound will be reassessed next week with her second treatment syphilis.  Penicillin G given as ordered. Patient tolerated without complaint. We reviewed she needs to complete treatment and avoid any sexual contact until at least 2 weeks after treatment for her and partner completed. She states she is not in contact with him anymore. Patient very teary and support given. She states baby was treated also and will be retested. Staci Acosta

## 2022-09-23 NOTE — Patient Instructions (Signed)
Take your Norvasc ( amlodipine) daily

## 2022-09-30 ENCOUNTER — Ambulatory Visit: Payer: Medicaid Other

## 2022-09-30 ENCOUNTER — Encounter: Payer: Self-pay | Admitting: Obstetrics and Gynecology

## 2022-10-01 ENCOUNTER — Other Ambulatory Visit: Payer: Self-pay

## 2022-10-01 ENCOUNTER — Ambulatory Visit (INDEPENDENT_AMBULATORY_CARE_PROVIDER_SITE_OTHER): Payer: Medicaid Other | Admitting: General Practice

## 2022-10-01 VITALS — BP 143/68 | HR 77 | Ht 66.0 in | Wt 372.0 lb

## 2022-10-01 DIAGNOSIS — Z8619 Personal history of other infectious and parasitic diseases: Secondary | ICD-10-CM | POA: Diagnosis not present

## 2022-10-01 DIAGNOSIS — I10 Essential (primary) hypertension: Secondary | ICD-10-CM

## 2022-10-01 DIAGNOSIS — G8918 Other acute postprocedural pain: Secondary | ICD-10-CM

## 2022-10-01 MED ORDER — AMLODIPINE BESYLATE 10 MG PO TABS: 10.0000 mg | ORAL_TABLET | Freq: Every day | ORAL | 0 refills | Status: DC

## 2022-10-01 MED ORDER — IBUPROFEN 600 MG PO TABS: 600.0000 mg | ORAL_TABLET | Freq: Four times a day (QID) | ORAL | 0 refills | Status: DC | PRN

## 2022-10-01 MED ORDER — PENICILLIN G BENZATHINE 1200000 UNIT/2ML IM SUSY
2.4000 10*6.[IU] | PREFILLED_SYRINGE | Freq: Once | INTRAMUSCULAR | Status: AC
  Administered 2022-10-01: 2.4 10*6.[IU] via INTRAMUSCULAR

## 2022-10-01 NOTE — Progress Notes (Signed)
Brandy Brock here for  Bicillin   Injection.  Injection administered without complication. Patient will return in one week for next injection.  BP 149/84 & 143/68 today. She reports having a headache for the last day. Tylenol helps initially but the headache comes back- denies dizziness or blurry vision however the become did become dizzy after Bicillin injections. Incision has 2cm area on the left side of her incision that is not approximated compared to the rest of her incision. Per chart review, this was already noted last week and silver nitrate was applied. Dr Currie Paris came in to assess incision and recommended patient keep it clean & dry for now. Will recheck incision next week at her appt. Dr Currie Paris increased Norvasc Rx to '10mg'$  daily & patient informed. New Rx ordered as well as refill on Ibuprofen per patient request.   Derinda Late, RN 10/01/2022  3:09 PM

## 2022-10-07 ENCOUNTER — Other Ambulatory Visit: Payer: Self-pay

## 2022-10-07 ENCOUNTER — Ambulatory Visit: Payer: Self-pay

## 2022-10-07 ENCOUNTER — Encounter: Payer: Self-pay | Admitting: Obstetrics and Gynecology

## 2022-10-08 ENCOUNTER — Other Ambulatory Visit: Payer: Self-pay

## 2022-10-08 ENCOUNTER — Ambulatory Visit (INDEPENDENT_AMBULATORY_CARE_PROVIDER_SITE_OTHER): Payer: Medicaid Other

## 2022-10-08 VITALS — BP 134/92 | HR 75 | Ht 66.0 in | Wt 380.9 lb

## 2022-10-08 DIAGNOSIS — Z202 Contact with and (suspected) exposure to infections with a predominantly sexual mode of transmission: Secondary | ICD-10-CM

## 2022-10-08 NOTE — Progress Notes (Unsigned)
Brandy Brock presents today for 3rd dose of penicillin for RPR treatment.   Received first dose of penicillin on 09/23/20 and second dose on 10/01/22. Patient received dose today--59m (1,200,000 units) in each glute muscle. [facility] tolerated well [facility]   Patient advised to watch for signs of allergic reaction and to contact office for any questions/concerns. Pt verbalized understanding.   VRae Roam, RN  10/08/22 at [phone]

## 2022-10-09 MED ORDER — PENICILLIN G BENZATHINE 1200000 UNIT/2ML IM SUSY
1.2000 10*6.[IU] | PREFILLED_SYRINGE | Freq: Once | INTRAMUSCULAR | Status: AC
  Administered 2022-10-08: 1.2 10*6.[IU] via INTRAMUSCULAR

## 2022-10-18 IMAGING — US US MFM OB FOLLOW-UP
1 series · 13 of 28 positions shown · non-contrast
Comparison: none

[Series 1: us mfm ob follow-up · 13 of 67 slices shown]
[im 3/67]
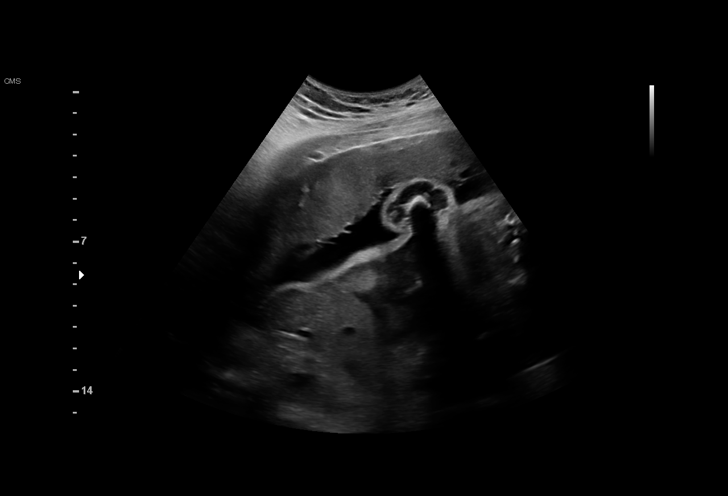
[im 8/67]
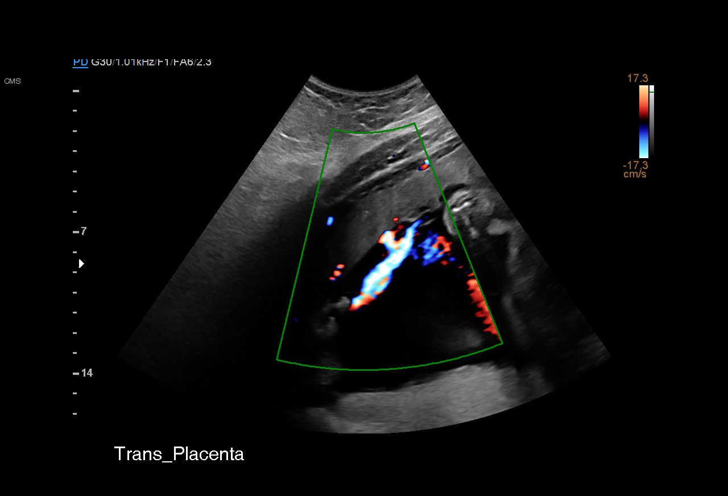
[im 13/67]
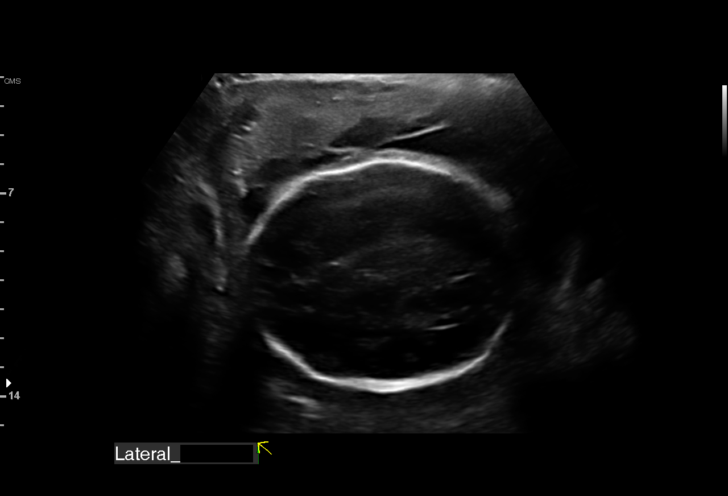
[im 18/67]
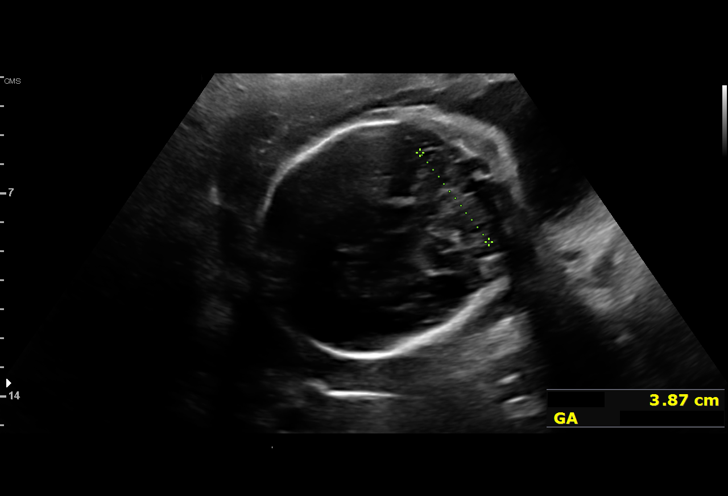
[im 23/67]
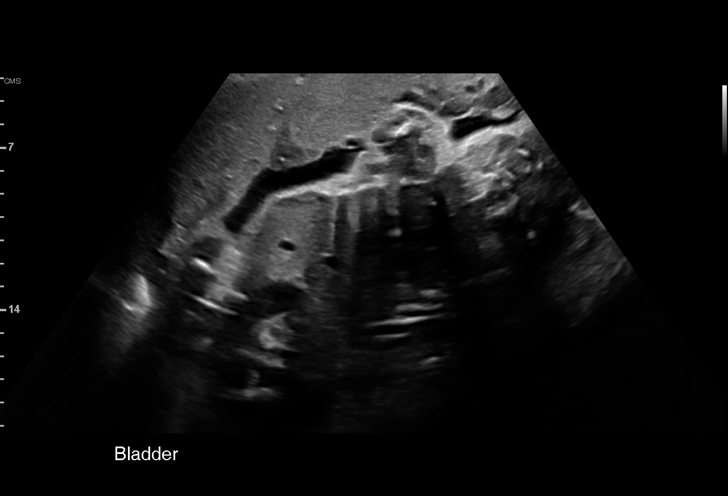
[im 27/67]
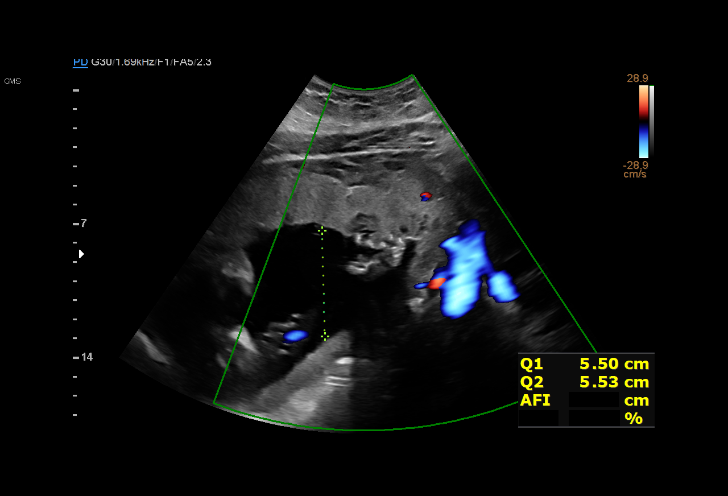
[im 35/67]
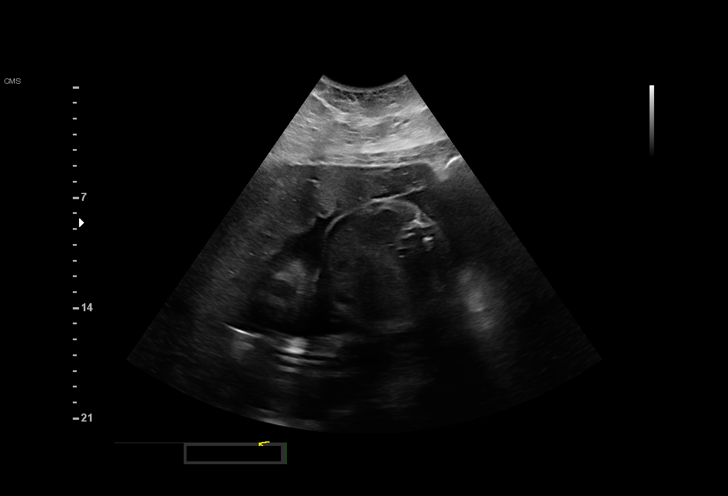
[im 40/67]
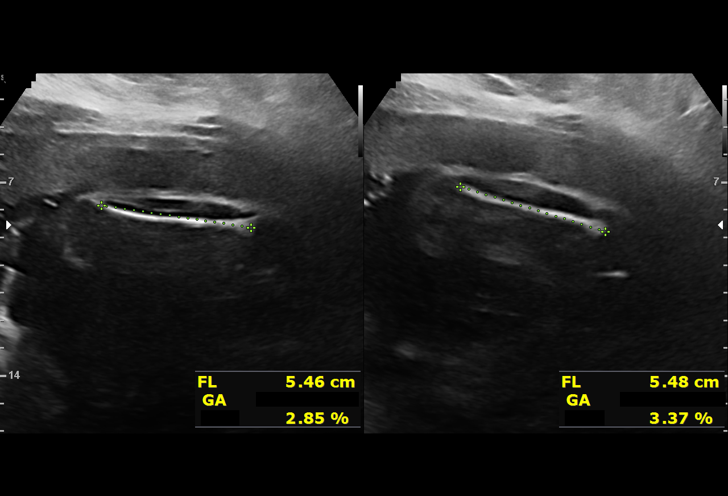
[im 45/67]
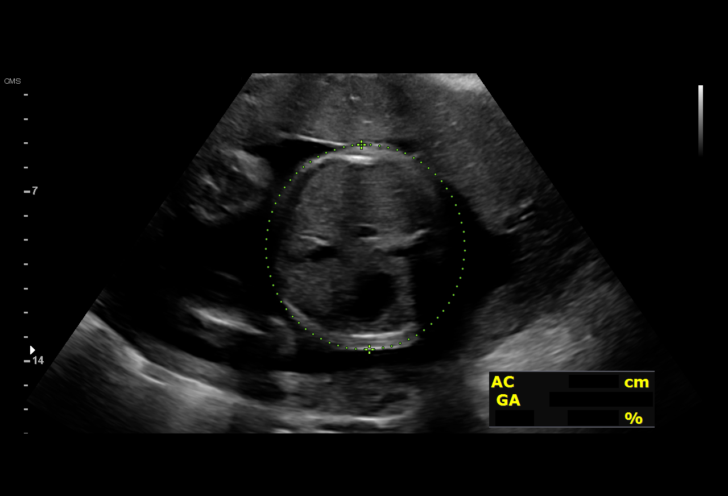
[im 49/67]
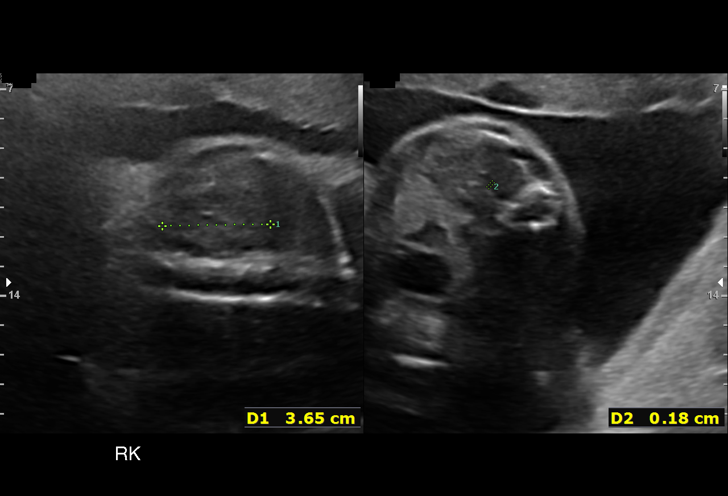
[im 54/67]
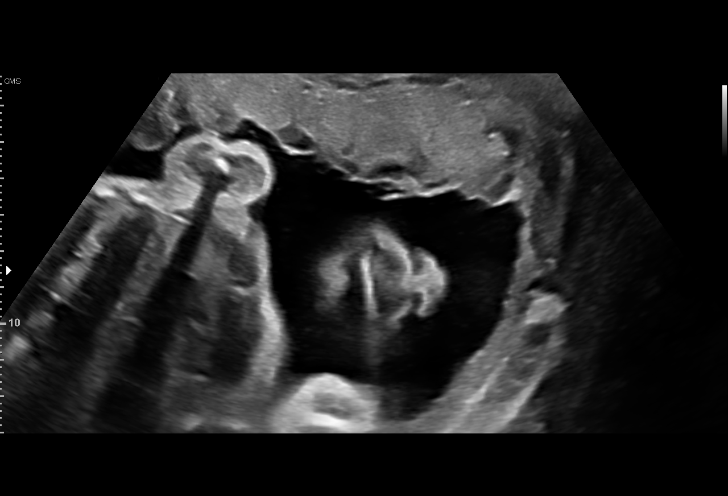
[im 59/67]
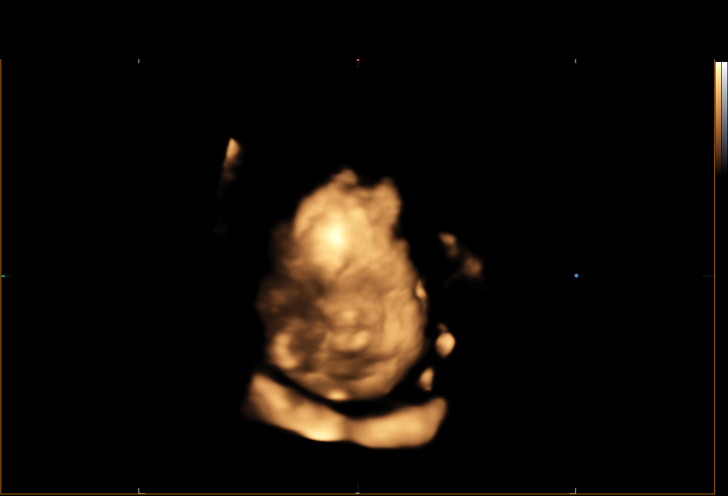
[im 64/67]
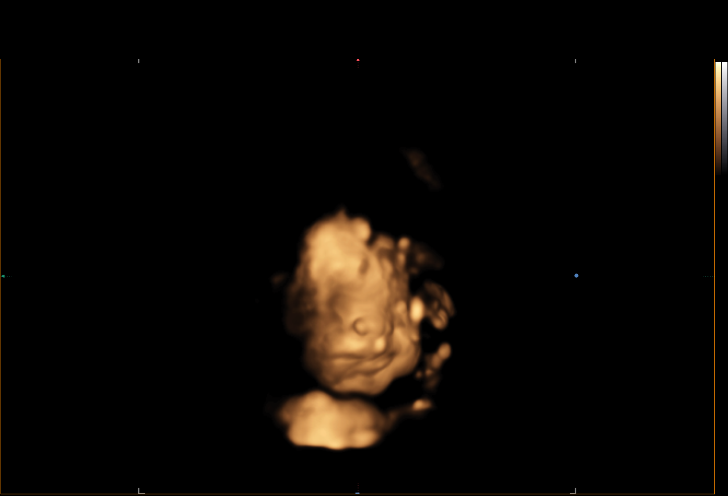

[13 of 28 positions shown; findings below may reference images not displayed]

TERRON NP                              [HOSPITAL] at
 Ref. Address:     Faculty Practice

Indications

 30 weeks gestation of pregnancy
 Hypertension - Chronic/Pre-existing
 Echogenic intracardiac focus of the heart
 (EIF)
 Asthma                                         XDD.ED j74.404
 Encounter for other antenatal screening
 follow-up
Vital Signs

                                                Height:        5'7"
Fetal Evaluation

 Num Of Fetuses:         1
 Fetal Heart Rate(bpm):  147
 Cardiac Activity:       Observed
 Presentation:           Cephalic
 Placenta:               Anterior
 P. Cord Insertion:      Visualized

 Amniotic Fluid
 AFI FV:      Within normal limits

 AFI Sum(cm)     %Tile       Largest Pocket(cm)
 24.1            95

 RUQ(cm)       RLQ(cm)       LUQ(cm)        LLQ(cm)
 5.5           6.6           6              6
Biometry

 BPD:        76  mm     G. Age:  30w 3d         27  %    CI:        71.46   %    70 - 86
                                                         FL/HC:      18.9   %    19.3 -
 HC:      286.3  mm     G. Age:  31w 3d         29  %    HC/AC:      1.09        0.96 -
 AC:      262.4  mm     G. Age:  30w 3d         32  %    FL/BPD:     71.2   %    71 - 87
 FL:       54.1  mm     G. Age:  28w 4d        1.9  %    FL/AC:      20.6   %    20 - 24
 HUM:      46.2  mm     G. Age:  27w 2d        < 5  %
 CER:      38.7  mm     G. Age:  31w 4d         50  %

 LV:        2.8  mm
 Est. FW:    5995  gm      3 lb 4 oz     13  %
OB History

 Gravidity:    1
Gestational Age

 LMP:           34w 4d        Date:  06/03/20                 EDD:   03/10/21
 U/S Today:     30w 2d                                        EDD:   04/09/21
 Best:          30w 6d     Det. By:  Previous Ultrasound      EDD:   04/05/21
                                     (10/02/20)
Anatomy

 Cranium:               Appears normal         Aortic Arch:            Previously seen
 Cavum:                 Appears normal         Ductal Arch:            Previously seen
 Ventricles:            Appears normal         Diaphragm:              Appears normal
 Choroid Plexus:        Previously seen        Stomach:                Appears normal, left
                                                                       sided
 Cerebellum:            Appears normal         Abdomen:                Appears normal
 Posterior Fossa:       Previously seen        Abdominal Wall:         Previously seen
 Nuchal Fold:           Previously seen        Cord Vessels:           Previously seen
 Face:                  Appears normal         Kidneys:                Appear normal
                        (orbits and profile)
 Lips:                  Appears normal         Bladder:                Appears normal
 Thoracic:              Appears normal         Spine:                  Appears normal
 Heart:                 Previously seen        Upper Extremities:      Previously seen
 RVOT:                  Previously seen        Lower Extremities:      Previously seen
 LVOT:                  Previously seen

 Other:  Male gender previously seen. Heels previously visualized. Nasal bone
         visualized. Technically difficult due to maternal habitus and fetal
         position.
Cervix Uterus Adnexa

 Cervix
 Not visualized (advanced GA >98wks)

 Uterus
 No abnormality visualized.

 Right Ovary
 Not visualized.
 Left Ovary
 Not visualized.

 Cul De Sac
 No free fluid seen.

 Adnexa
 No abnormality visualized.
Impression

 Follow up growth due to chronic hypertension in medication.
 Normal interval growth with measurements consistent with
 dates
 Good fetal movement and amniotic fluid volume

 Blood pressure today was 145/78 133/79 repeat. No signs or
 symptoms of preeclampsia.
Recommendations

 Follow up growth in 4 weeks.
 Initiate weekly testing at 32 weeks.

## 2022-10-22 NOTE — Progress Notes (Deleted)
    [place] Partum Visit Note  Brandy Brock is a 26 y.o. G17P2002 female who presents for a postpartum visit. She is 5 weeks postpartum following a repeat cesarean section.  I have fully reviewed the prenatal and intrapartum course. The delivery was at 37.3 gestational weeks.  Anesthesia: spinal. Postpartum course has been ***. Baby is doing well***. Baby is feeding by breast. Bleeding {vag bleed:12292}. Bowel function is {normal:32111}. Bladder function is {normal:32111}. Patient {is/is not:9024} sexually active. Contraception method is IUD. Postpartum depression screening: {gen negative/positive:315881}.   The pregnancy intention screening data noted above was reviewed. Potential methods of contraception were discussed. The patient elected to proceed with No data recorded.    Health Maintenance Due  Topic Date Due   HPV VACCINES (1 - 2-dose series) Never done   INFLUENZA VACCINE  06/11/2022   COVID-19 Vaccine (3 - 2023-24 season) 07/12/2022    {Common ambulatory SmartLinks:19316}  Review of Systems {ros; complete:30496}  Objective:  LMP  (LMP Unknown)    General:  {gen appearance:16600}   Breasts:  {desc; normal/abnormal/not indicated:14647}  Lungs: {lung exam:16931}  Heart:  {heart exam:5510}  Abdomen: {abdomen exam:16834}   Wound {Wound assessment:11097}  GU exam:  {desc; normal/abnormal/not indicated:14647}       Assessment:    There are no diagnoses linked to this encounter.  *** postpartum exam.   Plan:   Essential components of care per ACOG recommendations:  1.  Mood and well being: Patient with {gen negative/positive:315881} depression screening today. Reviewed local resources for support.  - Patient tobacco use? {tobacco use:25506}  - hx of drug use? {yes/no:25505}    2. Infant care and feeding:  -Patient currently breastmilk feeding? {yes/no:25502}  -Social determinants of health (SDOH) reviewed in EPIC. No concerns***The following needs were  identified***  3. Sexuality, contraception and birth spacing - Patient {DOES_DOES UYQ:03474} want a pregnancy in the next year.  Desired family size is {NUMBER 1-10:22536} children.  - Reviewed reproductive life planning. Reviewed contraceptive methods based on pt preferences and effectiveness.  Patient desired {Upstream End Methods:24109} today.   - Discussed birth spacing of 18 months  4. Sleep and fatigue -Encouraged family/partner/community support of 4 hrs of uninterrupted sleep to help with mood and fatigue  5. Physical Recovery  - Discussed patients delivery and complications. She describes her labor as {description:25511} - Patient had a {CHL AMB DELIVERY:352 768 4053}. Patient had a {laceration:25518} laceration. Perineal healing reviewed. Patient expressed understanding - Patient has urinary incontinence? {yes/no:25515} - Patient {ACTION; IS/IS QVZ:56387564} safe to resume physical and sexual activity  6.  Health Maintenance - HM due items addressed {Yes or If no, why not?:20788} - Last pap smear  Diagnosis  Date Value Ref Range Status  04/22/2022   Final   - Negative for intraepithelial lesion or malignancy (NILM)   Pap smear {done:10129} at today's visit.  -Breast Cancer screening indicated? {indicated:25516}  7. Chronic Disease/Pregnancy Condition follow up: {Follow up:25499}  - PCP follow up  [facility] [place]

## 2022-10-23 ENCOUNTER — Ambulatory Visit: Payer: Self-pay | Admitting: Family Medicine

## 2022-10-28 ENCOUNTER — Ambulatory Visit (INDEPENDENT_AMBULATORY_CARE_PROVIDER_SITE_OTHER): Payer: Medicaid Other | Admitting: Obstetrics and Gynecology

## 2022-10-28 ENCOUNTER — Encounter: Payer: Self-pay | Admitting: Obstetrics and Gynecology

## 2022-10-28 ENCOUNTER — Other Ambulatory Visit: Payer: Self-pay

## 2022-10-28 DIAGNOSIS — Z30011 Encounter for initial prescription of contraceptive pills: Secondary | ICD-10-CM | POA: Diagnosis not present

## 2022-10-28 DIAGNOSIS — O10919 Unspecified pre-existing hypertension complicating pregnancy, unspecified trimester: Secondary | ICD-10-CM

## 2022-10-28 DIAGNOSIS — Z309 Encounter for contraceptive management, unspecified: Secondary | ICD-10-CM | POA: Insufficient documentation

## 2022-10-28 HISTORY — DX: Encounter for contraceptive management, unspecified: Z30.9

## 2022-10-28 MED ORDER — DESOGESTREL-ETHINYL ESTRADIOL 0.15-30 MG-MCG PO TABS: 1.0000 | ORAL_TABLET | Freq: Every day | ORAL | 11 refills | Status: DC

## 2022-10-28 NOTE — Patient Instructions (Signed)

## 2022-10-28 NOTE — Progress Notes (Signed)
Pt states that she wants BC Pills at this time. Pt is also concerned of bleeding, she states has started to taper off.

## 2022-10-28 NOTE — Progress Notes (Signed)
    [place] Partum Visit Note  Brandy Brock is a 26 y.o. G46P2002 female who presents for a postpartum visit. She is 6 weeks postpartum following a repeat cesarean section.  I have fully reviewed the prenatal and intrapartum course. The delivery was at 37/3 gestational weeks.  Anesthesia: spinal. Postpartum course has been unremarkable. Baby is doing well. Baby is feeding by bottle - [employer] Good Start. Bleeding moderate lochia. Bowel function is normal. Bladder function is normal. Patient is not sexually active. Contraception method is none. Postpartum depression screening: negative.   The pregnancy intention screening data noted above was reviewed. Potential methods of contraception were discussed. The patient elected to proceed with No data recorded.    Health Maintenance Due  Topic Date Due   HPV VACCINES (1 - 2-dose series) Never done   INFLUENZA VACCINE  06/11/2022   COVID-19 Vaccine (3 - 2023-24 season) 07/12/2022    The following portions of the patient's history were reviewed and updated as appropriate: allergies, current medications, past family history, past medical history, past social history, past surgical history, and problem list.  Review of Systems Pertinent items noted in HPI and remainder of comprehensive ROS otherwise negative.  Objective:  LMP  (LMP Unknown)    General:  alert   Breasts:  not indicated  Lungs: clear to auscultation bilaterally  Heart:  regular rate and rhythm, S1, S2 normal, no murmur, click, rub or gallop  Abdomen: soft, non-tender; bowel sounds normal; no masses,  no organomegaly   Wound well approximated incision  GU exam:  not indicated       Assessment:    There are no diagnoses linked to this encounter.  NL postpartum exam.  CHTN Contraception management  Plan:   Essential components of care per ACOG recommendations:  1.  Mood and well being: Patient with negative depression screening today. Reviewed local resources for  support.  - Patient tobacco use? No.   - hx of drug use? No.    2. Infant care and feeding:  -Patient currently breastmilk feeding? No.  -Social determinants of health (SDOH) reviewed in EPIC. No concerns  3. Sexuality, contraception and birth spacing - Patient does not want a pregnancy in the next year.  Desired family size is uncertain  - Reviewed reproductive life planning. Reviewed contraceptive methods based on pt preferences and effectiveness.  Patient desired Oral Contraceptive today.   - Discussed birth spacing of 18 months  4. Sleep and fatigue -Encouraged family/partner/community support of 4 hrs of uninterrupted sleep to help with mood and fatigue  5. Physical Recovery  - Discussed patients delivery and complications. She describes her labor as good. - Patient had a C-section.  - Patient has urinary incontinence? No. - Patient is safe to resume physical and sexual activity  6.  Health Maintenance - HM due items addressed Yes - Last pap smear  Diagnosis  Date Value Ref Range Status  04/22/2022   Final   - Negative for intraepithelial lesion or malignancy (NILM)   Pap smear not done at today's visit.  -Breast Cancer screening indicated? No.   7. Chronic Disease/Pregnancy Condition follow up: Hypertension  Will complete Norvasc Rx and return for nurse visit for BP check Will start Apri. U/R/B and back up method reviewed with pt  Arlina Robes, MD [facility], [place]

## 2022-11-08 IMAGING — US US FETAL BPP W/ NON-STRESS
1 series · 13 of 13 positions shown · non-contrast
Comparison: none

[Series 1: us fetal bpp w/ non-stress · 13 acquisitions, 13 frames shown]
[im 1/13]
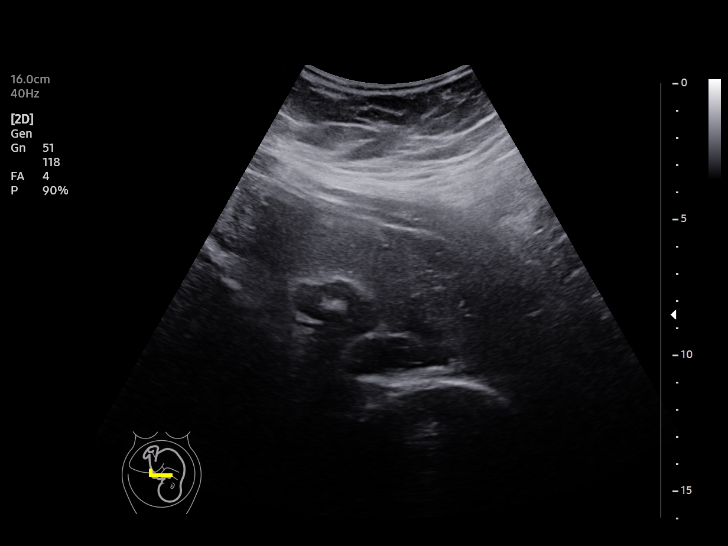
[im 2/13]
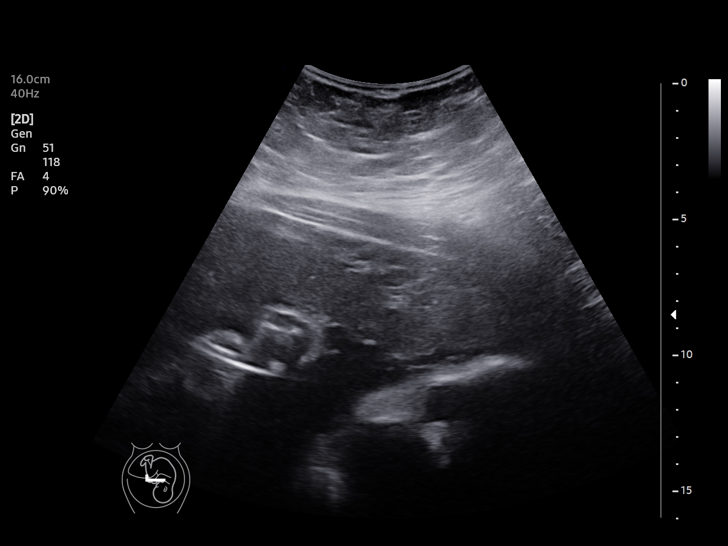
[im 3/13]
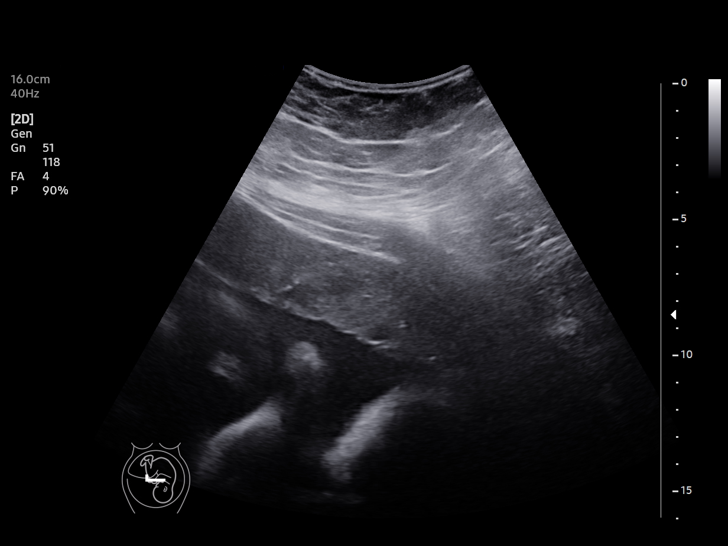
[im 4/13]
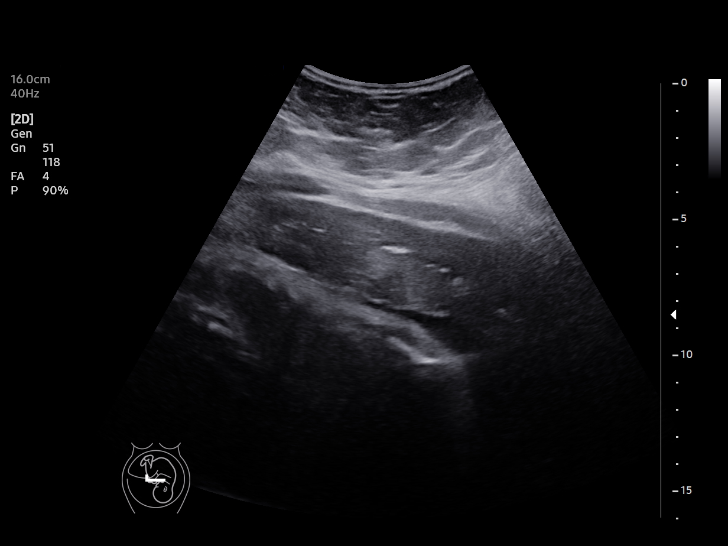
[im 5/13]
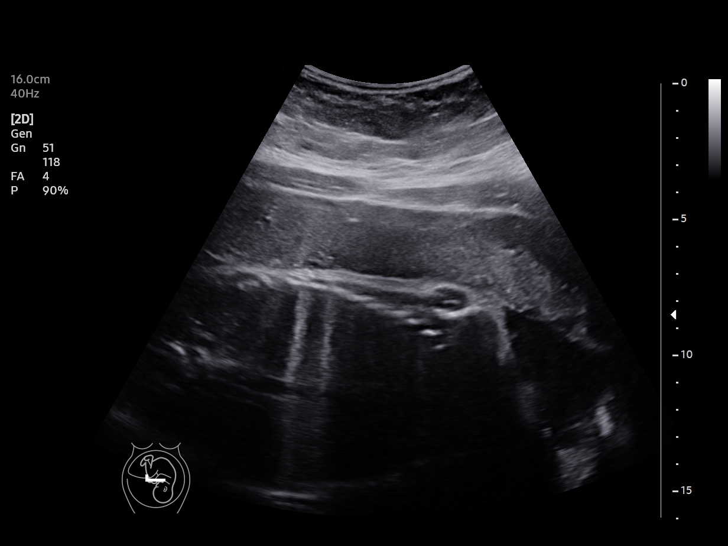
[im 6/13]
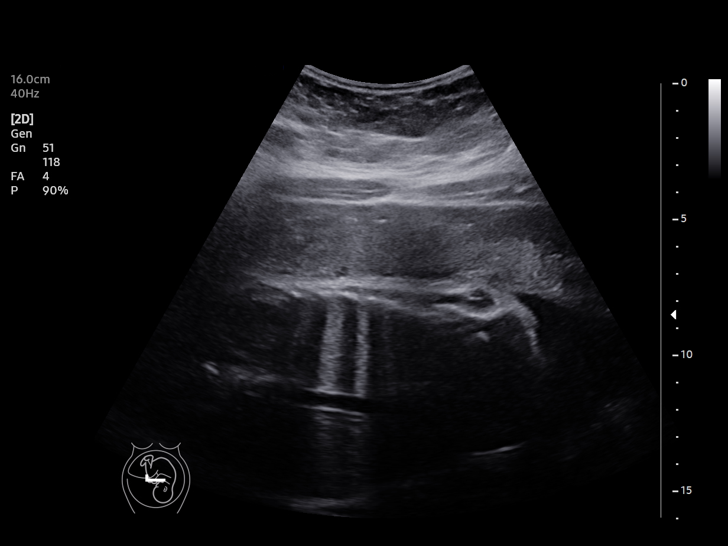
[im 7/13]
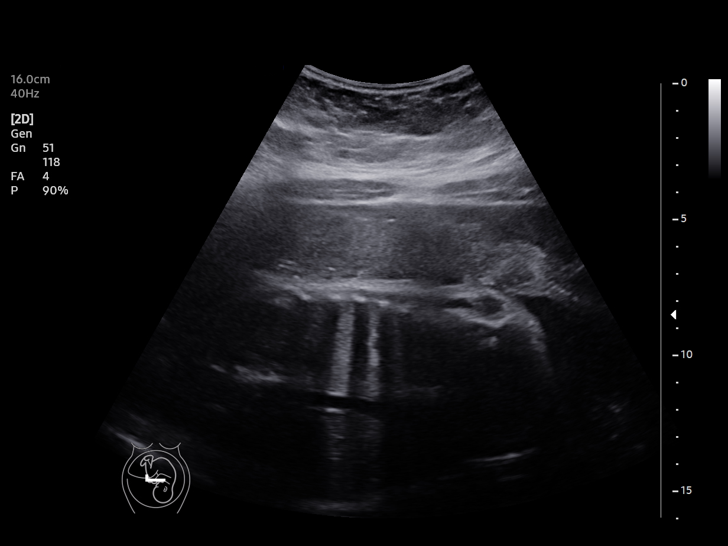
[im 8/13]
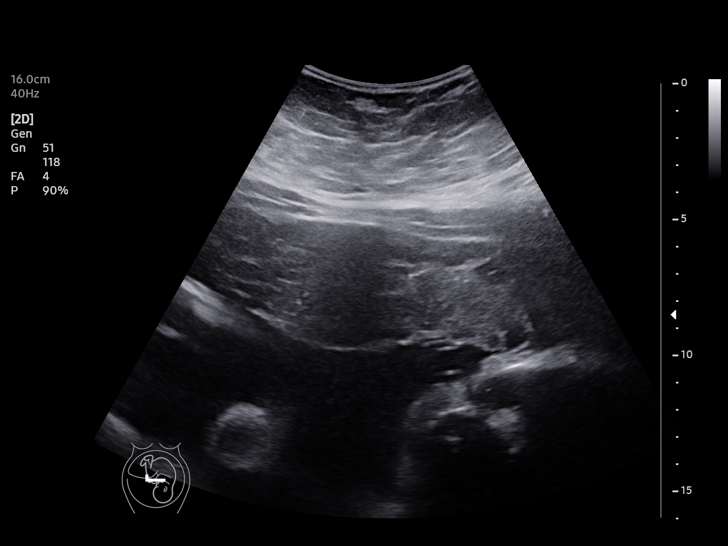
[im 9/13]
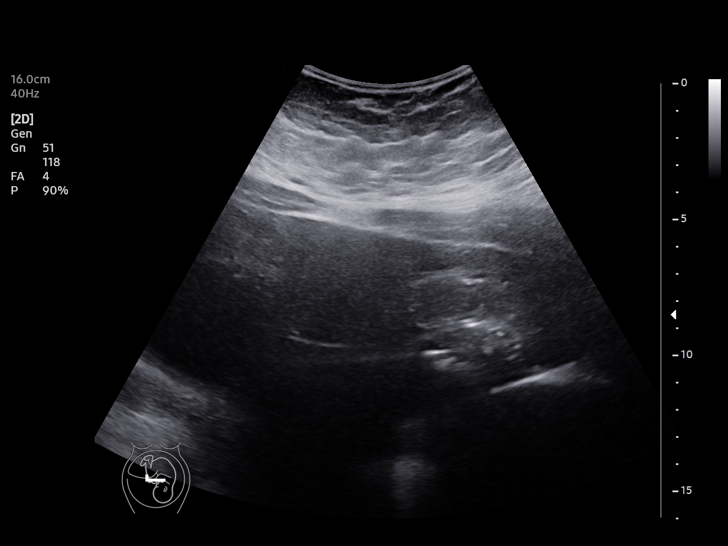
[im 10/13]
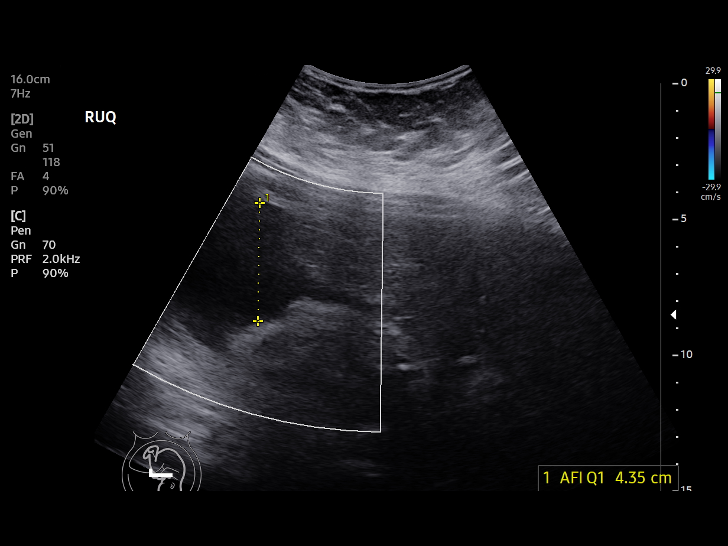
[im 11/13]
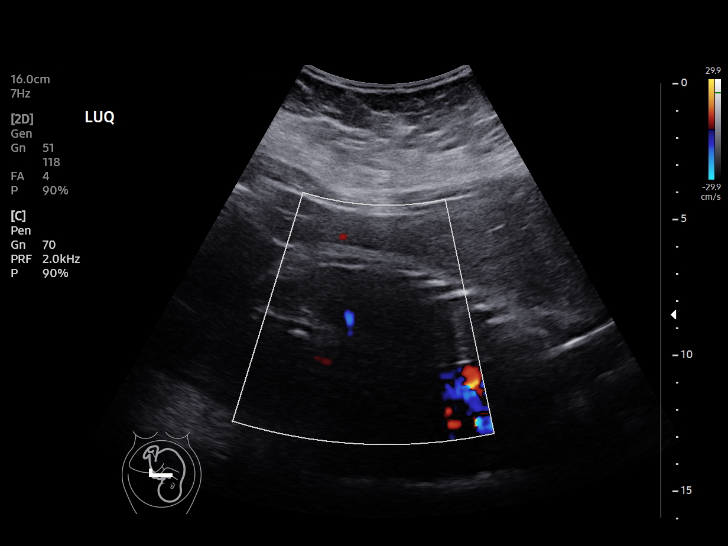
[im 12/13]
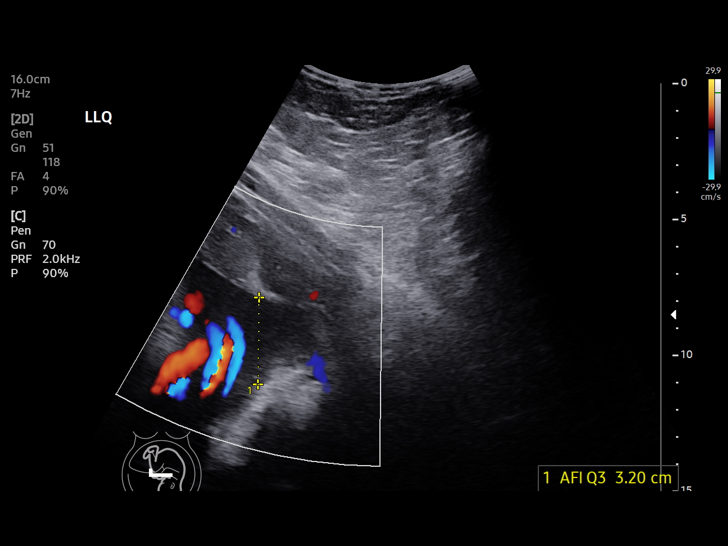
[im 13/13]
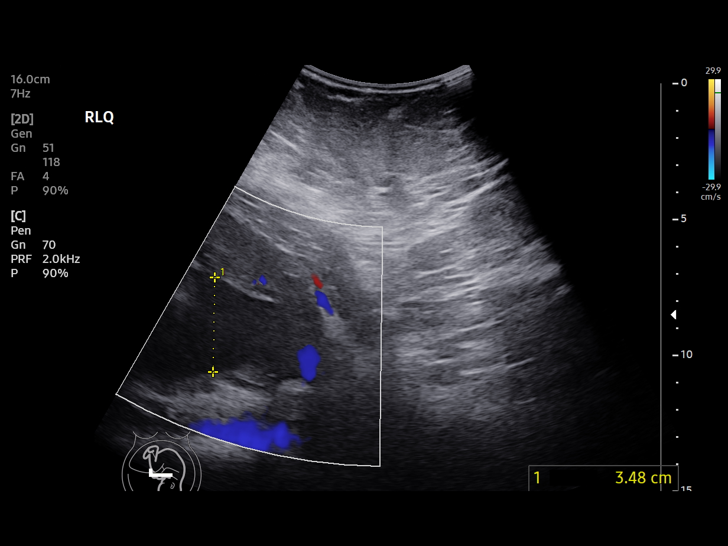

[13 of 13 positions shown; findings below may reference images not displayed]

NP                                        Healthcare at
 Ref. Address:      Faculty Practice

 1   US FETAL BPP W/NONSTRESS             [address].4      JUJU HARDER

Indications

 33 weeks gestation of pregnancy
 Pre-existing essential hypertension
 complicating pregnancy, third trimester
Vital Signs

                                                 Height:        5'7"
Fetal Evaluation

 Num Of Fetuses:          1
 Preg. Location:          Intrauterine
 Cardiac Activity:        Observed
 Fetal Lie:               Maternal left side
 Presentation:            Cephalic

 Amniotic Fluid
 AFI FV:      Within normal limits

 AFI Sum(cm)     %Tile       Largest Pocket(cm)
 11.03           26

 RUQ(cm)       RLQ(cm)                       LLQ(cm)

Biophysical Evaluation

 Amniotic F.V:   Pocket => 2 cm              F. Tone:         Observed
 F. Movement:    Observed                    N.S.T:           Reactive
 F. Breathing:   Observed                    Score:           [DATE]
OB History

 Gravidity:     1
Gestational Age

 LMP:            37w 4d       Date:  06/03/20                   EDD:  03/10/21
 Best:           33w 6d    Det. By:  Previous Ultrasound        EDD:  04/05/21
                                     (10/02/20)
Impression

 Antenatal testing is reassuring with BPP [DATE].  Normal amniotic
 fluid volume.
Recommendations

 Continue weekly antenatal testing till delivery .
              Newman, [place]

## 2022-11-27 ENCOUNTER — Ambulatory Visit (INDEPENDENT_AMBULATORY_CARE_PROVIDER_SITE_OTHER): Payer: Medicaid Other

## 2022-11-27 ENCOUNTER — Other Ambulatory Visit: Payer: Self-pay

## 2022-11-27 VITALS — BP 139/90 | HR 54

## 2022-11-27 DIAGNOSIS — Z0130 Encounter for examination of blood pressure without abnormal findings: Secondary | ICD-10-CM

## 2022-11-27 DIAGNOSIS — Z30011 Encounter for initial prescription of contraceptive pills: Secondary | ICD-10-CM

## 2022-11-27 MED ORDER — SLYND 4 MG PO TABS: 1.0000 | ORAL_TABLET | Freq: Every day | ORAL | 3 refills | Status: DC

## 2022-11-27 NOTE — Addendum Note (Signed)
Addended by: Madalyn Rob D on: 11/27/2022 10:22 AM   Modules accepted: Orders

## 2022-11-27 NOTE — Progress Notes (Signed)
Blood Pressure Check Visit  Brandy Brock is here for blood pressure check following C-section without labor on 09/18/2023. BP today is 148/87, 139/90. Patient denies any anxiety dizzness blurred vision headache shortness of breath peripheral edema. Pt is currently prescribed Amlodipine '10mg'$ . Pt states she takes it daily in the afternoon. Reviewed with Sheryn Bison MD. He advised patient continue to take Amlodipine daily and follow up with PCP. Reviewed with patient how to make appointment using [facility] website. Pt is also prescribed APRI (desogestrel-ethinyl estradiol). MD advised changing birth control pills to [facility]. Rx sent to pharmacy. Reviewed plan of care with patient who was agreeable.   Martina Sinner, RN 11/27/2022  9:28 AM

## 2023-02-10 ENCOUNTER — Encounter (HOSPITAL_COMMUNITY): Payer: Self-pay | Admitting: Emergency Medicine

## 2023-02-10 ENCOUNTER — Emergency Department (HOSPITAL_COMMUNITY): Payer: Medicaid Other

## 2023-02-10 ENCOUNTER — Other Ambulatory Visit: Payer: Self-pay

## 2023-02-10 ENCOUNTER — Emergency Department (HOSPITAL_COMMUNITY)
Admission: EM | Admit: 2023-02-10 | Discharge: 2023-02-11 | Disposition: A | Discharge status: Home / Self Care | Payer: Medicaid Other | Attending: Emergency Medicine | Admitting: Emergency Medicine

## 2023-02-10 DIAGNOSIS — R112 Nausea with vomiting, unspecified: Secondary | ICD-10-CM | POA: Diagnosis not present

## 2023-02-10 DIAGNOSIS — R519 Headache, unspecified: Secondary | ICD-10-CM | POA: Diagnosis not present

## 2023-02-10 DIAGNOSIS — Z5321 Procedure and treatment not carried out due to patient leaving prior to being seen by health care provider: Secondary | ICD-10-CM | POA: Insufficient documentation

## 2023-02-10 DIAGNOSIS — R5383 Other fatigue: Secondary | ICD-10-CM | POA: Insufficient documentation

## 2023-02-10 LAB — CBG MONITORING, ED: Glucose-Capillary: 86 mg/dL (ref 70–99)

## 2023-02-10 MED ORDER — ONDANSETRON 4 MG PO TBDP: 4.0000 mg | ORAL_TABLET | Freq: Once | ORAL | Status: DC

## 2023-02-10 MED ORDER — ACETAMINOPHEN 500 MG PO TABS: 1000.0000 mg | ORAL_TABLET | Freq: Once | ORAL | Status: DC

## 2023-02-10 NOTE — ED Notes (Signed)
Patient transported to CT 

## 2023-02-10 NOTE — ED Notes (Signed)
I attempted to collect labs and was unsuccessful. 

## 2023-02-10 NOTE — ED Provider Triage Note (Signed)
Emergency Medicine Provider Triage Evaluation Note  Brandy Brock , a 27 y.o. female  was evaluated in triage.  Pt complains of headache. Was at work today and develop severe headache, nausea, vomiting, and feeling fatigue.  Headache <6 hrs ago.  No focal numbness or focal weakness.  No fever, or neck stiffness.  LMP 2 mos ago  Review of Systems  Positive: As above Negative: As above  Physical Exam  BP (!) 157/95   Pulse 82   Temp 98.5 F (36.9 C) (Oral)   Resp 18   Wt (!) 172.4 kg   LMP 12/25/2022   SpO2 99%   BMI 61.33 kg/m  Gen:   Awake, no distress   Resp:  Normal effort  MSK:   Moves extremities without difficulty  Other:    Medical Decision Making  Medically screening exam initiated at 9:28 PM.  Appropriate orders placed.  Brandy Brock was informed that the remainder of the evaluation will be completed by another provider, this initial triage assessment does not replace that evaluation, and the importance of remaining in the ED until their evaluation is complete.     Domenic Moras, PA-C 02/10/23 2129

## 2023-02-10 NOTE — ED Notes (Signed)
1st Phlebotomy attempt unsuccessful. Will attempt again when pt returns from CT.

## 2023-02-10 NOTE — ED Triage Notes (Signed)
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 HTN 160/110's, emesis x 1 on arrival to triage.

## 2023-02-11 NOTE — ED Notes (Signed)
Pt states she wants to leave AMA, registration notified

## 2023-05-14 ENCOUNTER — Other Ambulatory Visit: Payer: Self-pay

## 2023-05-14 ENCOUNTER — Emergency Department (HOSPITAL_COMMUNITY)
Admission: EM | Admit: 2023-05-14 | Discharge: 2023-05-15 | Disposition: A | Discharge status: Home / Self Care | Payer: Medicaid Other | Attending: Emergency Medicine | Admitting: Emergency Medicine

## 2023-05-14 ENCOUNTER — Encounter (HOSPITAL_COMMUNITY): Payer: Self-pay

## 2023-05-14 DIAGNOSIS — Z79899 Other long term (current) drug therapy: Secondary | ICD-10-CM | POA: Diagnosis not present

## 2023-05-14 DIAGNOSIS — J45909 Unspecified asthma, uncomplicated: Secondary | ICD-10-CM | POA: Insufficient documentation

## 2023-05-14 DIAGNOSIS — D72829 Elevated white blood cell count, unspecified: Secondary | ICD-10-CM | POA: Diagnosis not present

## 2023-05-14 DIAGNOSIS — R112 Nausea with vomiting, unspecified: Secondary | ICD-10-CM

## 2023-05-14 DIAGNOSIS — R42 Dizziness and giddiness: Secondary | ICD-10-CM | POA: Diagnosis present

## 2023-05-14 DIAGNOSIS — Z7951 Long term (current) use of inhaled steroids: Secondary | ICD-10-CM | POA: Diagnosis not present

## 2023-05-14 DIAGNOSIS — R55 Syncope and collapse: Secondary | ICD-10-CM | POA: Diagnosis not present

## 2023-05-14 DIAGNOSIS — Z91010 Allergy to peanuts: Secondary | ICD-10-CM | POA: Insufficient documentation

## 2023-05-14 DIAGNOSIS — I10 Essential (primary) hypertension: Secondary | ICD-10-CM | POA: Diagnosis not present

## 2023-05-14 DIAGNOSIS — R519 Headache, unspecified: Secondary | ICD-10-CM | POA: Diagnosis not present

## 2023-05-14 LAB — CBG MONITORING, ED: Glucose-Capillary: 80 mg/dL (ref 70–99)

## 2023-05-14 LAB — CBC
HCT: 38 % (ref 36.0–46.0)
Hemoglobin: 12.2 g/dL (ref 12.0–15.0)
MCH: 28.6 pg (ref 26.0–34.0)
MCHC: 32.1 g/dL (ref 30.0–36.0)
MCV: 89.2 fL (ref 80.0–100.0)
Platelets: 209 10*3/uL (ref 150–400)
RBC: 4.26 MIL/uL (ref 3.87–5.11)
RDW: 13.2 % (ref 11.5–15.5)
WBC: 10.8 10*3/uL — ABNORMAL HIGH (ref 4.0–10.5)
nRBC: 0 % (ref 0.0–0.2)

## 2023-05-14 LAB — BASIC METABOLIC PANEL
Anion gap: 8 (ref 5–15)
BUN: 10 mg/dL (ref 6–20)
CO2: 25 mmol/L (ref 22–32)
Calcium: 9.1 mg/dL (ref 8.9–10.3)
Chloride: 102 mmol/L (ref 98–111)
Creatinine, Ser: 0.69 mg/dL (ref 0.44–1.00)
GFR, Estimated: >60 mL/min (ref >60)
Glucose, Bld: 88 mg/dL (ref 70–99)
Potassium: 3.8 mmol/L (ref 3.5–5.1)
Sodium: 135 mmol/L (ref 135–145)

## 2023-05-14 LAB — LIPASE, BLOOD: Lipase: 36 U/L (ref 11–51)

## 2023-05-14 LAB — HCG, SERUM, QUALITATIVE: Preg, Serum: NEGATIVE

## 2023-05-14 MED ORDER — ONDANSETRON 4 MG PO TBDP
4.0000 mg | ORAL_TABLET | Freq: Once | ORAL | Status: AC | PRN
  Administered 2023-05-14: 4 mg via ORAL
  Filled 2023-05-14: qty 1

## 2023-05-14 MED ORDER — SODIUM CHLORIDE 0.9 % IV BOLUS
1000.0000 mL | Freq: Once | INTRAVENOUS | Status: AC
  Administered 2023-05-15: 1000 mL via INTRAVENOUS

## 2023-05-14 NOTE — ED Triage Notes (Signed)
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 Pt has hx of htn, was taken off medication 7 months ago.  Pt is vomiting in triage.

## 2023-05-15 LAB — URINALYSIS, ROUTINE W REFLEX MICROSCOPIC
Bilirubin Urine: NEGATIVE
Glucose, UA: NEGATIVE mg/dL
Hgb urine dipstick: NEGATIVE
Ketones, ur: NEGATIVE mg/dL
Nitrite: NEGATIVE
Protein, ur: NEGATIVE mg/dL
Specific Gravity, Urine: 1.01 (ref 1.005–1.030)
pH: 7 (ref 5.0–8.0)

## 2023-05-15 MED ORDER — ONDANSETRON HCL 4 MG PO TABS: 4.0000 mg | ORAL_TABLET | Freq: Three times a day (TID) | ORAL | 0 refills | Status: DC | PRN

## 2023-05-15 NOTE — Discharge Instructions (Addendum)
You were evaluated today for a near syncopal episode.  Your workup was reassuring.  I have prescribed Zofran for nausea to be taken as directed.  I recommend following with primary care as needed.  If you do not have a primary care provider you may contact the number in paperwork for assistance in finding 1.  If you develop any life-threatening symptoms such as sudden onset chest pain, shortness of breath, please return to the emergency department.

## 2023-05-15 NOTE — ED Provider Notes (Signed)
[place] EMERGENCY DEPARTMENT AT [facility] Provider Note   CSN: [id number] Arrival date & time: 05/14/23  2150     History  Chief Complaint  Patient presents with   Near Syncope   Dizziness    Brandy Brock is a 27 y.o. female.  Patient presents to the emergency department with lightheadedness, headache, and near syncope that happened approximately 6:00 last night.  Patient was at work, sitting in a chair when she noticed that she had a headache.  She subsequently developed lightheadedness.  She endorses associated nausea and upon arrival at the emergency department had an episode of emesis.  She denies chest pain, shortness of breath, abdominal pain, urinary symptoms.  She endorses a past medical history of asthma, hypertension (not currently on medication)  HPI     Home Medications Prior to Admission medications   Medication Sig Start Date End Date Taking? Authorizing Provider  ondansetron (ZOFRAN) 4 MG tablet Take 1 tablet (4 mg total) by mouth every 8 (eight) hours as needed for nausea or vomiting. 05/15/23  Yes Darrick Grinder, PA-C  albuterol (VENTOLIN HFA) 108 (90 Base) MCG/ACT inhaler Inhale 2 puffs into the lungs every 6 (six) hours as needed for wheezing or shortness of breath. Patient taking differently: Inhale 2 puffs into the lungs daily as needed for wheezing or shortness of breath. 11/13/20   Burleson, Brand Males, NP  amLODipine (NORVASC) 10 MG tablet Take 1 tablet (10 mg total) by mouth daily. 10/01/22   Lorriane Shire, MD  Drospirenone (SLYND) 4 MG TABS Take 1 tablet by mouth daily. 11/27/22   Maringouin Bing, MD  ibuprofen (ADVIL) 600 MG tablet Take 1 tablet (600 mg total) by mouth every 6 (six) hours as needed. 10/01/22   Lorriane Shire, MD  Prenatal Vit-Fe Fumarate-FA (MULTIVITAMIN-PRENATAL) 27-0.8 MG TABS tablet Take 1 tablet by mouth daily at 12 noon. Patient not taking: Reported on 11/27/2022    [provider]      Allergies     Peanut-containing drug products    Review of Systems   Review of Systems  Physical Exam Updated Vital Signs BP (!) 161/72 (BP Location: Left Arm)   Pulse (!) 59   Temp 98.1 F (36.7 C)   Resp 16   Ht 5\' 6"  (1.676 m)   Wt (!) 172.4 kg   SpO2 100%   BMI 61.33 kg/m  Physical Exam Vitals and nursing note reviewed.  Constitutional:      General: She is not in acute distress.    Appearance: She is well-developed. She is obese.  HENT:     Head: Normocephalic and atraumatic.  Eyes:     Conjunctiva/sclera: Conjunctivae normal.  Cardiovascular:     Rate and Rhythm: Normal rate and regular rhythm.     Heart sounds: No murmur heard. Pulmonary:     Effort: Pulmonary effort is normal. No respiratory distress.     Breath sounds: Normal breath sounds.  Abdominal:     Palpations: Abdomen is soft.     Tenderness: There is no abdominal tenderness.  Musculoskeletal:        General: No swelling.     Cervical back: Neck supple.  Skin:    General: Skin is warm and dry.     Capillary Refill: Capillary refill takes less than 2 seconds.  Neurological:     General: No focal deficit present.     Mental Status: She is alert and oriented to person, place, and time.  Psychiatric:  Mood and Affect: Mood normal.     ED Results / Procedures / Treatments   Labs (all labs ordered are listed, but only abnormal results are displayed) Labs Reviewed  CBC - Abnormal; Notable for the following components:      Result Value   WBC 10.8 (*)    All other components within normal limits  URINALYSIS, ROUTINE W REFLEX MICROSCOPIC - Abnormal; Notable for the following components:   APPearance HAZY (*)    Leukocytes,Ua LARGE (*)    Bacteria, UA RARE (*)    All other components within normal limits  BASIC METABOLIC PANEL  HCG, SERUM, QUALITATIVE  LIPASE, BLOOD  CBG MONITORING, ED    EKG None  Radiology No results found.  Procedures Procedures    Medications Ordered in  ED Medications  ondansetron (ZOFRAN-ODT) disintegrating tablet 4 mg (4 mg Oral Given 05/14/23 2200)  sodium chloride 0.9 % bolus 1,000 mL (1,000 mLs Intravenous Bolus 05/15/23 0028)    ED Course/ Medical Decision Making/ A&P                             Medical Decision Making Risk Prescription drug management.   This patient presents to the ED for concern of near syncope, this involves an extensive number of treatment options, and is a complaint that carries with it a high risk of complications and morbidity.  The differential diagnosis includes vasovagal syncope, orthostatic changes, infection, dysrhythmia, migraine, intracranial abnormality, others   Co morbidities that complicate the patient evaluation  Hypertension   Additional history obtained:  Additional history obtained from family at bedside  Lab Tests:  I Ordered, and personally interpreted labs.  The pertinent results include: Grossly unremarkable CBC, BMP, negative pregnancy test, UA with large leukocytes, rare bacteria   Imaging Studies ordered:  I considered a CT of the head but headache had no red flag symptoms.   Cardiac Monitoring: / EKG:  The patient was maintained on a cardiac monitor.  I personally viewed and interpreted the cardiac monitored which showed an underlying rhythm of: Sinus rhythm   Problem List / ED Course / Critical interventions / Medication management   I ordered medication including normal saline bolus, Zofran  Reevaluation of the patient after these medicines showed that the patient improved I have reviewed the patients home medicines and have made adjustments as needed   Social Determinants of Health:  Patient has Medicaid for primary insurance type   Test / Admission - Considered:  Patient with a near syncopal episode.  Question if patient may have been dehydrated.  Presentation not concerning for intracranial abnormality, no head trauma, no sudden onset headache, headache  not worst of life.  Labs grossly unremarkable.  No dysrhythmia noted.  No metabolic abnormality noted.  Patient with leukocytes and rare bacteria on UA but asymptomatic.  Doubt UTI at this time.  Plan to discharge home with recommendations for continued hydration at home with return precautions.  No indication at this time for admission.         Final Clinical Impression(s) / ED Diagnoses Final diagnoses:  Near syncope  Nausea and vomiting, unspecified vomiting type    Rx / DC Orders ED Discharge Orders          Ordered    ondansetron (ZOFRAN) 4 MG tablet  Every 8 hours PRN        05/15/23 0223  Darrick Grinder, PA-C 05/15/23 0238    Melene Plan, DO 05/15/23 [phone]

## 2023-06-24 ENCOUNTER — Encounter: Payer: Self-pay | Admitting: Obstetrics and Gynecology

## 2023-06-24 ENCOUNTER — Ambulatory Visit (INDEPENDENT_AMBULATORY_CARE_PROVIDER_SITE_OTHER): Payer: Medicaid Other

## 2023-06-24 DIAGNOSIS — Z3201 Encounter for pregnancy test, result positive: Secondary | ICD-10-CM | POA: Diagnosis not present

## 2023-06-24 LAB — POCT PREGNANCY, URINE: Preg Test, Ur: POSITIVE — AB

## 2023-06-24 MED ORDER — PREPLUS 27-1 MG PO TABS
1.0000 | ORAL_TABLET | Freq: Every day | ORAL | 11 refills | Status: DC
Start: 2023-06-24 — End: 2023-11-26

## 2023-06-24 NOTE — Progress Notes (Signed)
Possible Pregnancy  Here today for pregnancy confirmation; patient left urine sample and would like to be called with results at [phone]. UPT in office today is positive. Pt reports first positive home UPT on 06/19/23. Reviewed dating with patient:   LMP: 05/14/23 (approximate)--patient reports irregular periods prior to getting pregnant EDD: 02/18/2024 5w 6d today  Patient is now a G3P2, with hx of c/s x2 due to pre-E with both pregnancies. Patient denies any bleeding and pain. Patient not currently taking any medications; would like Rx prenatal vitamins sent into pharmacy.  D/t unsure LMP and irregular periods, dating [place] ordered and scheduled--MyChart message regarding appointment sent to patient. Appointment scheduled on 08/06/23 at 0930.  OB history reviewed. Reviewed medications and allergies with patient; list of medications safe to take during pregnancy given.  Recommended pt begin prenatal vitamin and schedule prenatal care.  Meryl Crutch, RN 06/24/2023  2:43 PM

## 2023-07-01 ENCOUNTER — Inpatient Hospital Stay (HOSPITAL_COMMUNITY)
Admission: AD | Admit: 2023-07-01 | Discharge: 2023-07-01 | Disposition: A | Discharge status: Home / Self Care | Payer: Medicaid Other | Attending: Obstetrics and Gynecology | Admitting: Obstetrics and Gynecology

## 2023-07-01 ENCOUNTER — Encounter (HOSPITAL_COMMUNITY): Payer: Self-pay

## 2023-07-01 ENCOUNTER — Inpatient Hospital Stay (HOSPITAL_COMMUNITY): Payer: Medicaid Other

## 2023-07-01 DIAGNOSIS — R1032 Left lower quadrant pain: Secondary | ICD-10-CM | POA: Insufficient documentation

## 2023-07-01 DIAGNOSIS — O30049 Twin pregnancy, dichorionic/diamniotic, unspecified trimester: Secondary | ICD-10-CM | POA: Insufficient documentation

## 2023-07-01 DIAGNOSIS — R42 Dizziness and giddiness: Secondary | ICD-10-CM | POA: Insufficient documentation

## 2023-07-01 DIAGNOSIS — O26851 Spotting complicating pregnancy, first trimester: Secondary | ICD-10-CM | POA: Diagnosis not present

## 2023-07-01 DIAGNOSIS — O30001 Twin pregnancy, unspecified number of placenta and unspecified number of amniotic sacs, first trimester: Secondary | ICD-10-CM | POA: Insufficient documentation

## 2023-07-01 DIAGNOSIS — O26891 Other specified pregnancy related conditions, first trimester: Secondary | ICD-10-CM | POA: Diagnosis present

## 2023-07-01 DIAGNOSIS — O161 Unspecified maternal hypertension, first trimester: Secondary | ICD-10-CM | POA: Insufficient documentation

## 2023-07-01 DIAGNOSIS — R109 Unspecified abdominal pain: Secondary | ICD-10-CM

## 2023-07-01 DIAGNOSIS — O34211 Maternal care for low transverse scar from previous cesarean delivery: Secondary | ICD-10-CM | POA: Insufficient documentation

## 2023-07-01 DIAGNOSIS — I10 Essential (primary) hypertension: Secondary | ICD-10-CM

## 2023-07-01 DIAGNOSIS — Z3A01 Less than 8 weeks gestation of pregnancy: Secondary | ICD-10-CM | POA: Diagnosis not present

## 2023-07-01 LAB — COMPREHENSIVE METABOLIC PANEL
ALT: 14 U/L (ref 0–44)
AST: 17 U/L (ref 15–41)
Albumin: 3.3 g/dL — ABNORMAL LOW (ref 3.5–5.0)
Alkaline Phosphatase: 35 U/L — ABNORMAL LOW (ref 38–126)
Anion gap: 10 (ref 5–15)
BUN: <5 mg/dL — ABNORMAL LOW (ref 6–20)
CO2: 20 mmol/L — ABNORMAL LOW (ref 22–32)
Calcium: 9 mg/dL (ref 8.9–10.3)
Chloride: 105 mmol/L (ref 98–111)
Creatinine, Ser: 0.63 mg/dL (ref 0.44–1.00)
GFR, Estimated: >60 mL/min (ref >60)
Glucose, Bld: 85 mg/dL (ref 70–99)
Potassium: 3.6 mmol/L (ref 3.5–5.1)
Sodium: 135 mmol/L (ref 135–145)
Total Bilirubin: 0.9 mg/dL (ref 0.3–1.2)
Total Protein: 7.3 g/dL (ref 6.5–8.1)

## 2023-07-01 LAB — CBC
HCT: 34.6 % — ABNORMAL LOW (ref 36.0–46.0)
Hemoglobin: 11.5 g/dL — ABNORMAL LOW (ref 12.0–15.0)
MCH: 28.9 pg (ref 26.0–34.0)
MCHC: 33.2 g/dL (ref 30.0–36.0)
MCV: 86.9 fL (ref 80.0–100.0)
Platelets: 208 10*3/uL (ref 150–400)
RBC: 3.98 MIL/uL (ref 3.87–5.11)
RDW: 13.2 % (ref 11.5–15.5)
WBC: 9.7 10*3/uL (ref 4.0–10.5)
nRBC: 0 % (ref 0.0–0.2)

## 2023-07-01 LAB — PROTEIN / CREATININE RATIO, URINE
Creatinine, Urine: 263 mg/dL
Protein Creatinine Ratio: 0.11 mg/mg{Cre} (ref 0.00–0.15)
Total Protein, Urine: 29 mg/dL

## 2023-07-01 LAB — HCG, QUANTITATIVE, PREGNANCY: hCG, Beta Chain, Quant, S: 68449 m[IU]/mL — ABNORMAL HIGH (ref <5)

## 2023-07-01 LAB — TYPE AND SCREEN
ABO/RH(D): B POS
Antibody Screen: NEGATIVE

## 2023-07-01 MED ORDER — BLOOD PRESSURE KIT: 1.0000 | PACK | Freq: Every day | 0 refills | Status: DC

## 2023-07-01 MED ORDER — DOXYLAMINE-PYRIDOXINE 10-10 MG PO TBEC: 1.0000 | DELAYED_RELEASE_TABLET | Freq: Three times a day (TID) | ORAL | 0 refills | Status: DC

## 2023-07-01 NOTE — Discharge Instructions (Signed)
[facility] [place], [place] Address: [address], [place], [place] [address] Phone: [phone] Hours:  Sunday:  Closed Monday:  8:00 am -- 5:00 pm Tuesday: 8:00 am -- 5:00 pm Wednesday:  8:00 am -- 5:00 pm Thursday:  8:00 am -- 5:00 pm Friday:  8:00 am -- 4:00 pm Saturday:  8:00 am -- 12:00 pm  [facility] Address: [address], [place], [place] [address] Phone: [phone] Hours:  Monday 9AM-5PM Tuesday 10AM-6PM Wednesday 11AM-7PM Thursday 9AM-5PM Friday              8AM-2PM Saturday Sunday

## 2023-07-01 NOTE — MAU Note (Signed)
Brandy Brock is a 27 y.o. at [redacted]w[redacted]d here in MAU reporting: having some lower abd pain, more so left side.  Has also noted some light bleeding, only when she wipes.  Had a little pain and bleeding twice last wk.   Never experienced with other pregnancies.  Onset of complaint: early this morning Pain score: 5 Vitals:   07/01/23 1124  BP: (!) 143/73  Pulse: 69  Resp: 18  Temp: 98.5 F (36.9 C)  SpO2: 99%      Lab orders placed from triage:  blood work drawn while in triage

## 2023-07-01 NOTE — MAU Provider Note (Signed)
History     CSN: [id number]  Arrival date and time: 07/01/23 1111   None     No chief complaint on file.  HPI Brandy Brock is a 27 y.o. G3P2002 at [redacted]w[redacted]d who presents for spotting and cramping. She started noticing some mild spotting and cramping last week which was intermittent. Symptoms improved. This AM, her spotting returned and cramping was more severe than it has been. Her pain is mainly in her left lower quadrant. She reports having intercourse the day before yesterday. She denies any fevers, chills, CP, palp, dyspnea, c/d, urinary sxs. No overt bleeding or passage of clots. She endorses mild lightheadedness that she associates with not sleeping well last night. She also endorses nausea and vomiting, not taking any meds for this.    Past Medical History:  Diagnosis Date   Asthma    childhood   Chlamydia    Elevated liver function tests 12/11/2020   Elevated in MAU on 1/7 - repeat beginning of February    Hypertension    LGSIL of cervix of undetermined significance    Pregnancy induced hypertension    Status post repeat low transverse cesarean section 04/30/2021   03/2021 LTCS: arrest of dilation 7cm   Syphilis affecting pregnancy     Past Surgical History:  Procedure Laterality Date   CESAREAN SECTION N/A 03/17/2021   Procedure: CESAREAN SECTION;  Surgeon: [place] Bing, MD;  [facility];  Service: Obstetrics;  Laterality: N/A;   CESAREAN SECTION N/A 09/15/2022   Procedure: CESAREAN SECTION;  Surgeon:  Bing, MD;  [facility];  Service: Obstetrics;  Laterality: N/A;   MULTIPLE EXTRACTIONS WITH ALVEOLOPLASTY N/A 10/11/2016   Procedure: MULTIPLE EXTRACTION WITH ALVEOLOPLASTY;  Surgeon: Ocie Doyne, DDS;  Location: [facility];  Service: Oral Surgery;  Laterality: N/A;    Family History  Problem Relation Age of Onset   Diabetes Father    Diabetes Brother     Social History   Tobacco Use   Smoking status: Never   Smokeless tobacco: Former   Building services engineer status: Never Used  Substance Use Topics   Alcohol use: Not Currently   Drug use: No    Allergies:  Allergies  Allergen Reactions   Peanut-Containing Drug Products Itching and Swelling    Facial itching and swelling.    Medications Prior to Admission  Medication Sig Dispense Refill Last Dose   albuterol (VENTOLIN HFA) 108 (90 Base) MCG/ACT inhaler Inhale 2 puffs into the lungs every 6 (six) hours as needed for wheezing or shortness of breath. (Patient not taking: Reported on 06/24/2023) 8 g 2    amLODipine (NORVASC) 10 MG tablet Take 1 tablet (10 mg total) by mouth daily. (Patient not taking: Reported on 06/24/2023) 30 tablet 0    Drospirenone (SLYND) 4 MG TABS Take 1 tablet by mouth daily. (Patient not taking: Reported on 06/24/2023) 84 tablet 3    ibuprofen (ADVIL) 600 MG tablet Take 1 tablet (600 mg total) by mouth every 6 (six) hours as needed. (Patient not taking: Reported on 06/24/2023) 30 tablet 0    ondansetron (ZOFRAN) 4 MG tablet Take 1 tablet (4 mg total) by mouth every 8 (eight) hours as needed for nausea or vomiting. (Patient not taking: Reported on 06/24/2023) 20 tablet 0    Prenatal Vit-Fe Fumarate-FA (MULTIVITAMIN-PRENATAL) 27-0.8 MG TABS tablet Take 1 tablet by mouth daily at 12 noon. (Patient not taking: Reported on 11/27/2022)      Prenatal Vit-Fe Fumarate-FA (  PREPLUS) 27-1 MG TABS Take 1 tablet by mouth daily. 30 tablet 11     Review of Systems  Constitutional:  Negative for chills and fever.  Respiratory:  Negative for shortness of breath.   Cardiovascular:  Negative for chest pain, palpitations and leg swelling.  Gastrointestinal:  Positive for abdominal pain (LLQ), nausea and vomiting. Negative for constipation and diarrhea.  Genitourinary:  Positive for pelvic pain and vaginal discharge. Negative for dysuria, frequency, urgency and vaginal pain.  Musculoskeletal:  Negative for back pain.  Skin:  Negative for rash.  Neurological:  Positive for  light-headedness. Negative for dizziness and headaches.   Physical Exam   Blood pressure (!) 143/73, pulse 69, temperature 98.5 F (36.9 C), temperature source Oral, resp. rate 18, height 5\' 7"  (1.702 m), weight (!) 168.9 kg, last menstrual period 05/14/2023, SpO2 99%, not currently breastfeeding.  Physical Exam Constitutional:      General: She is not in acute distress.    Appearance: Normal appearance. She is not ill-appearing.  HENT:     Head: Normocephalic and atraumatic.  Cardiovascular:     Rate and Rhythm: Normal rate and regular rhythm.     Heart sounds: Normal heart sounds.  Pulmonary:     Effort: Pulmonary effort is normal.     Breath sounds: Normal breath sounds.  Abdominal:     General: There is no distension.     Palpations: Abdomen is soft.     Tenderness: There is no abdominal tenderness. There is no guarding.  Musculoskeletal:        General: Normal range of motion.  Skin:    General: Skin is warm and dry.     Findings: No rash.  Neurological:     General: No focal deficit present.     Mental Status: She is alert and oriented to person, place, and time.  Psychiatric:        Mood and Affect: Mood normal.        Behavior: Behavior normal.    MAU Course  Procedures  MDM Brandy Brock is a 27 y.o. G3P2002 at [redacted]w[redacted]d who presents for spotting and cramping in early pregnancy. She has had ongoing spotting x 1 week, no passage of clots. VSS, exam benign. Will order CBC, CMP, hCG, T+S, U/S for further evaluation.   1:48 PM Labs reassuring, hCG [id number], U/S images reviewed with Dr. Alvester Morin -- IUP with twins, FHT 115, 118. Results discussed with patient. Pt considering options for this pregnancy -- given resources for termination of pregnancy. Will also send message to [facility] team to help coordinate BP follow up for pt -- previously on Amlodipine, but not currently taking. BP elevated here -- no sxs. Cuff sent.    Assessment and Plan  Abdominal pain in pregnancy, first  trimester - Plan: Discharge patient Improved throughout stay at MAU  no VB  return precautions discussed with patient  Elevated blood pressure affecting pregnancy in first trimester, antepartum - Plan: Discharge patient Has h/o chronic HTN, prev on Amlodipine  cuff Rx'ed to pt  will arrange BP f/up with [facility]  Twin gestation in first trimester, unspecified multiple gestation type Discussed findings with patient  she is considering options in terms of proceeding with pregnancy, resources given  return precautions addressed  Patient discussed with Dr. Alvester Morin  Sundra Aland, MD OB Fellow, Faculty Practice [facility], [place], Center for [facility]

## 2023-07-07 ENCOUNTER — Ambulatory Visit (INDEPENDENT_AMBULATORY_CARE_PROVIDER_SITE_OTHER): Payer: Medicaid Other | Admitting: *Deleted

## 2023-07-07 ENCOUNTER — Other Ambulatory Visit: Payer: Self-pay

## 2023-07-07 VITALS — BP 127/68 | HR 71 | Ht 67.0 in | Wt 370.5 lb

## 2023-07-07 DIAGNOSIS — Z0130 Encounter for examination of blood pressure without abnormal findings: Secondary | ICD-10-CM

## 2023-07-07 NOTE — Progress Notes (Signed)
Here for BP check. HX CHTN, had not been on meds for about 7 months. Denies HA''s since MAU visit. BP wnl at 127/ 68. Reviewed upcoming new ob intake, new ob , and [place] appointments. She voices understanding. Nancy Fetter

## 2023-07-27 IMAGING — DX DG ANKLE COMPLETE 3+V*R*
3 series · 3 of 3 positions shown · non-contrast
Comparison: None.

CLINICAL DATA: Twisted her right ankle

EXAM:
RIGHT ANKLE - COMPLETE 3+ VIEW

[ankle ap]
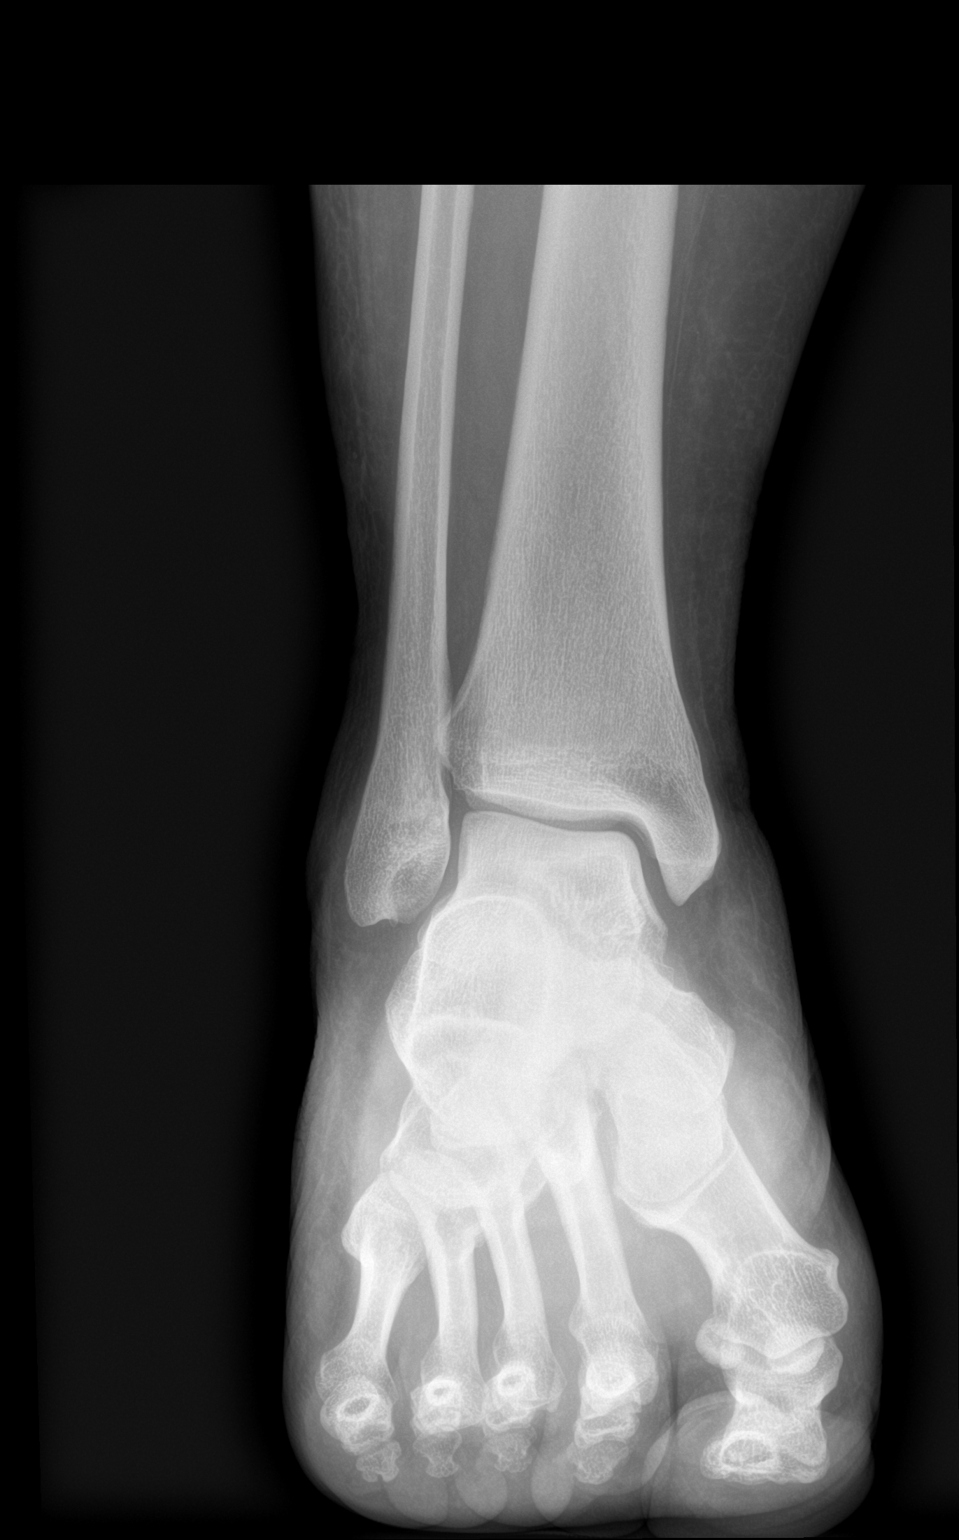

[ankle obl]
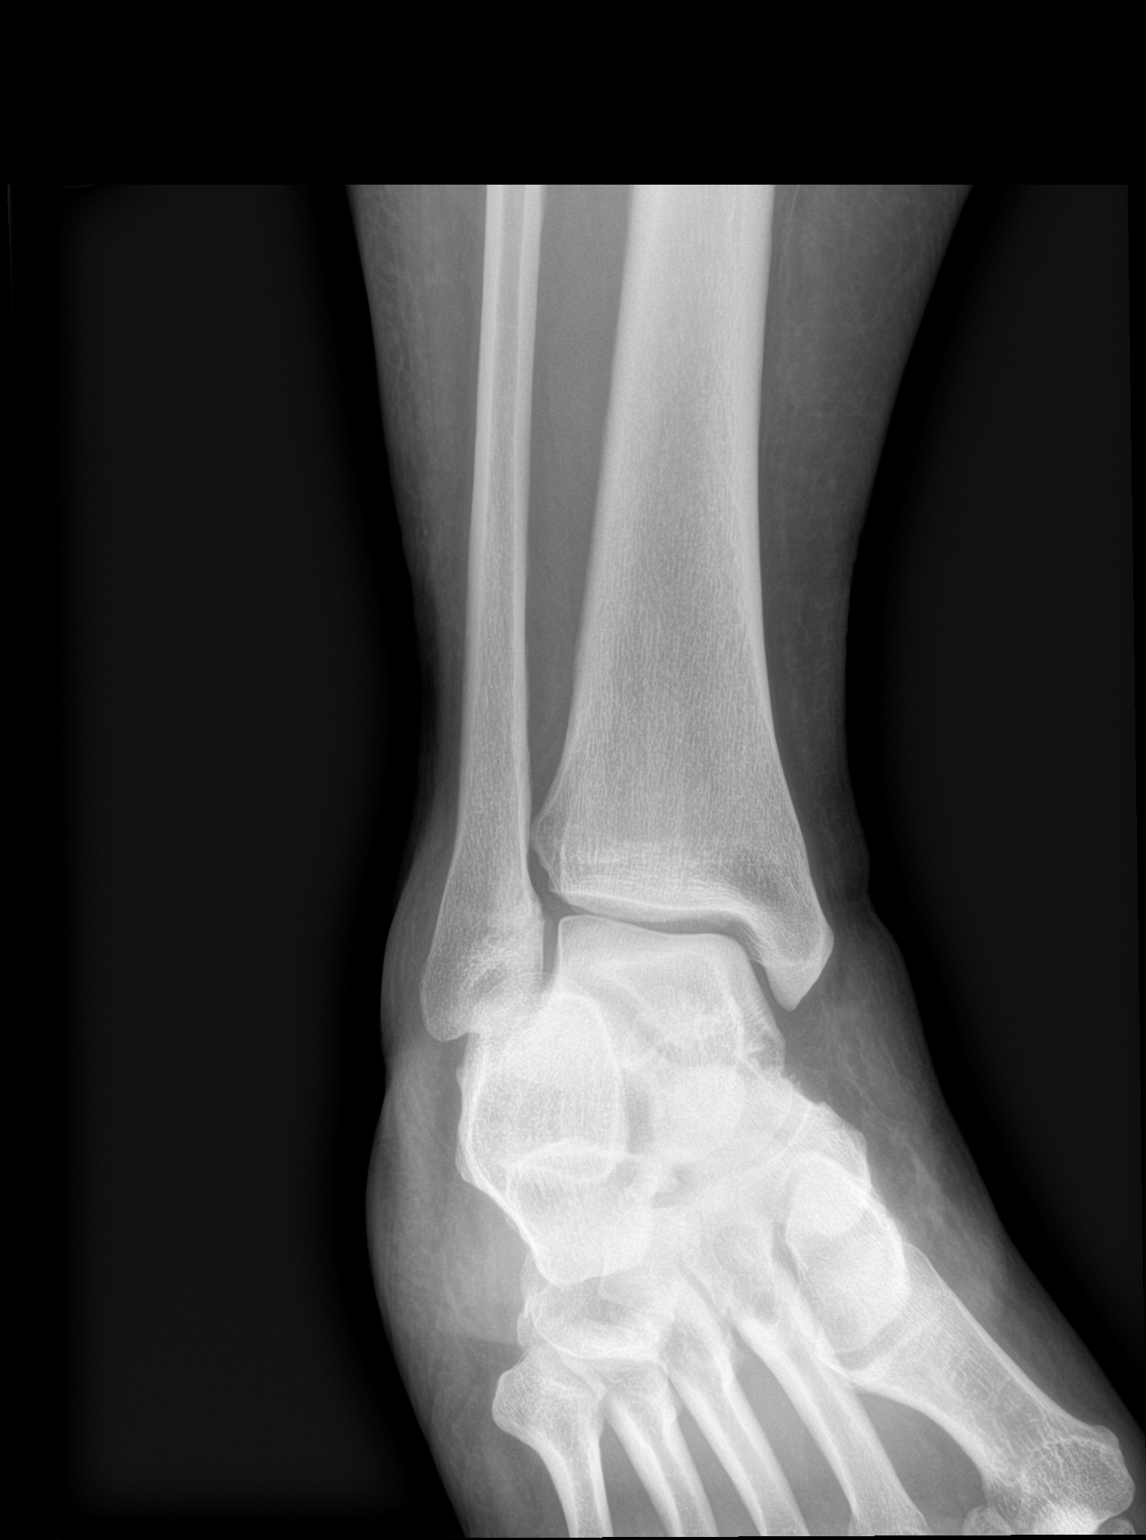

[ankle lat]
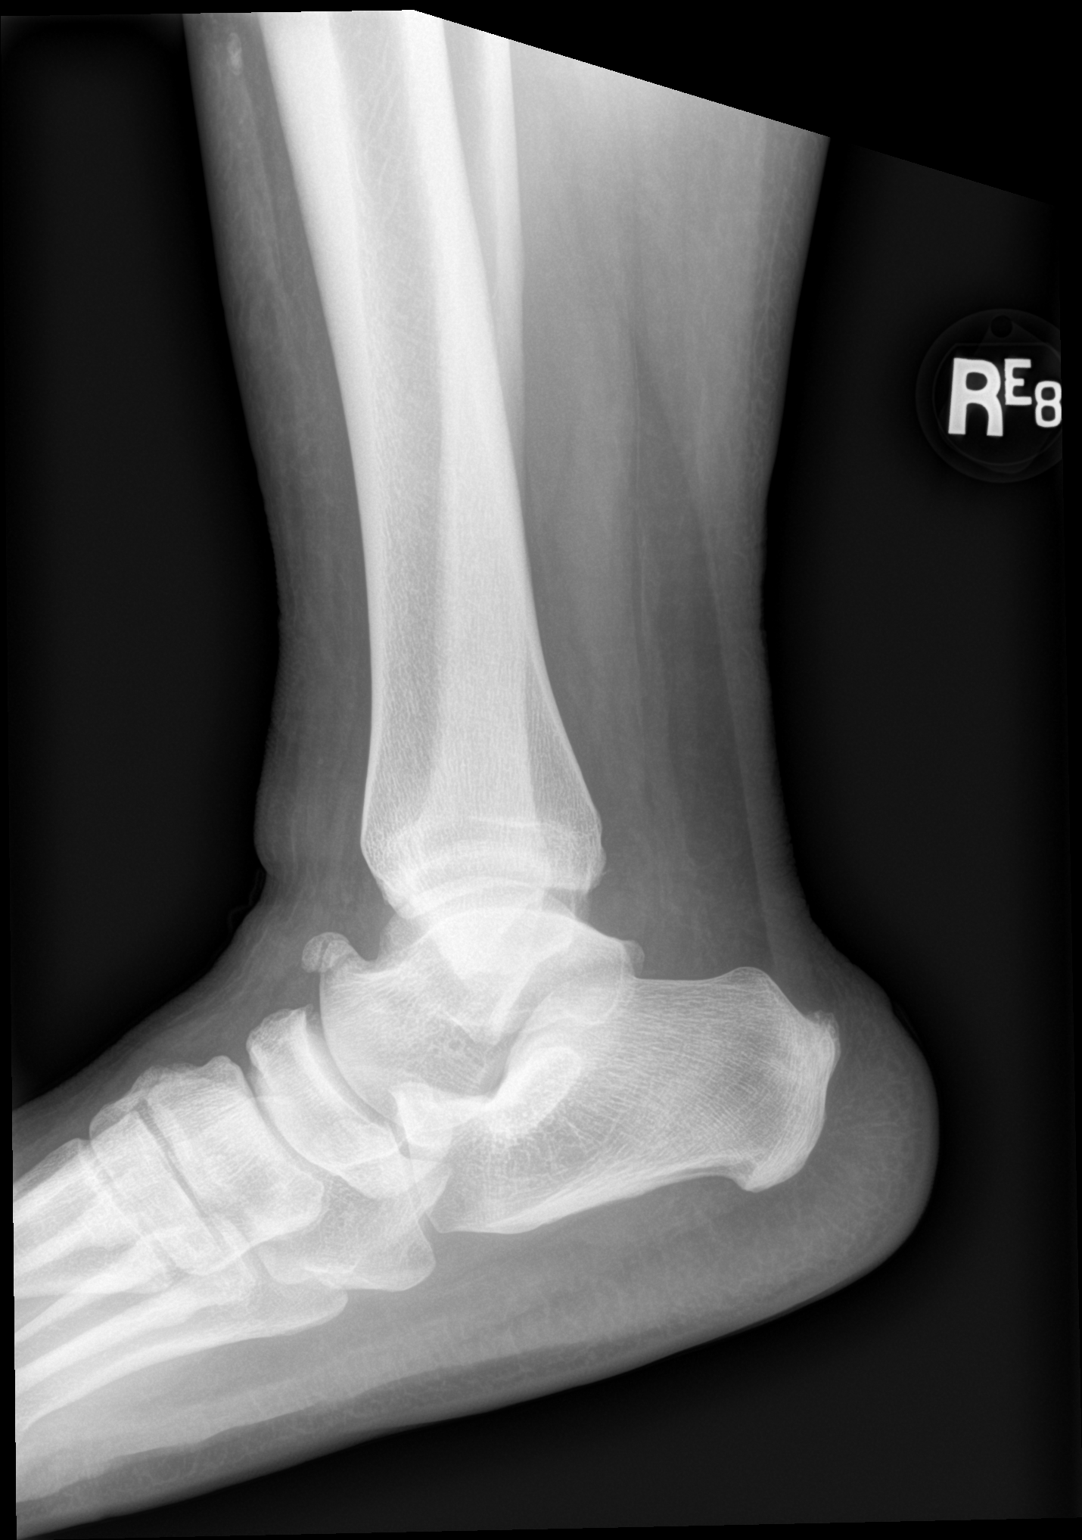

[3 of 3 positions shown; findings below may reference images not displayed]

FINDINGS: There is no evidence of fracture, dislocation, or joint effusion.
The ankle mortise is preserved. There is no evidence of arthropathy
or other focal bone abnormality. Soft tissue swelling about the
medial greater than lateral ankle.
IMPRESSION: Negative.

## 2023-07-29 ENCOUNTER — Telehealth (INDEPENDENT_AMBULATORY_CARE_PROVIDER_SITE_OTHER): Payer: Medicaid Other

## 2023-07-29 DIAGNOSIS — Z3A10 10 weeks gestation of pregnancy: Secondary | ICD-10-CM

## 2023-07-29 DIAGNOSIS — O0990 Supervision of high risk pregnancy, unspecified, unspecified trimester: Secondary | ICD-10-CM | POA: Insufficient documentation

## 2023-07-29 DIAGNOSIS — Z3490 Encounter for supervision of normal pregnancy, unspecified, unspecified trimester: Secondary | ICD-10-CM | POA: Insufficient documentation

## 2023-07-29 DIAGNOSIS — O0991 Supervision of high risk pregnancy, unspecified, first trimester: Secondary | ICD-10-CM

## 2023-07-29 MED ORDER — BLOOD PRESSURE KIT DEVI: 1.0000 | 0 refills | Status: DC | PRN

## 2023-07-29 NOTE — Progress Notes (Signed)
New OB Intake  I connected with Brandy Brock  on 07/29/23 at  1:15 PM EDT by MyChart Video Visit and verified that I am speaking with the correct person using two identifiers. Nurse is located at [facility] and pt is located at home.  I discussed the limitations, risks, security and privacy concerns of performing an evaluation and management service by telephone and the availability of in person appointments. I also discussed with the patient that there may be a patient responsible charge related to this service. The patient expressed understanding and agreed to proceed.  I explained I am completing New OB Intake today. We discussed EDD of 02/18/2024, by Last Menstrual Period. Pt is G3P2002. I reviewed her allergies, medications and Medical/Surgical/OB history.    Patient Active Problem List   Diagnosis Date Noted   Twin gestation in first trimester 07/01/2023   Elevated blood pressure affecting pregnancy in first trimester, antepartum 07/01/2023   History of syphilis 04/22/2022   BMI 60.0-69.9, adult (HCC) 10/13/2008    Concerns addressed today  Delivery Plans Plans to deliver at [facility]. Discussed the nature of our practice with multiple providers including residents and students. Due to the size of the practice, the delivering provider may not be the same as those providing prenatal care.   Patient is not interested in water birth. Offered upcoming OB visit with CNM to discuss further.  MyChart/Babyscripts MyChart access verified. I explained pt will have some visits in office and some virtually. Babyscripts instructions given and order placed. Patient verifies receipt of registration text/e-mail. Account successfully created and app downloaded.  Blood Pressure Cuff/Weight Scale Blood pressure cuff ordered for patient to pick-up from [employer]. Explained after first prenatal appt pt will check weekly and document in Babyscripts.  Anatomy US Explained first scheduled [place] will be  around 19 weeks. Anatomy US scheduled for 09/24/2023 at 8:15am.  Is patient a CenteringPregnancy candidate?  Declined Not a candidate due to Multiple gestation (mono-mono or mono-di)   Is patient a Mom+Baby Combined Care candidate?  Not a candidate   If accepted, confirm patient does not intend to move from the area for at least 12 months, then notify Mom+Baby staff  Interested in [place]? If yes, send referral and doula dot phrase.   Is patient a candidate for Babyscripts Optimization? No - hypertension and twin pregnancy  First visit review I reviewed new OB appt with patient. Explained pt will be seen by Brandy Brock, CNM at first visit. Discussed Brandy Brock genetic screening with patient.  Panorama and Horizon.. Routine prenatal labs is needed at new ob visit.   Last Pap Diagnosis  Date Value Ref Range Status  04/22/2022   Final   - Negative for intraepithelial lesion or malignancy (NILM)    Brandy Brock, CMA 07/29/2023  1:12 PM

## 2023-07-29 NOTE — Patient Instructions (Signed)

## 2023-07-31 ENCOUNTER — Encounter (HOSPITAL_COMMUNITY): Payer: Self-pay | Admitting: Family Medicine

## 2023-07-31 ENCOUNTER — Other Ambulatory Visit (HOSPITAL_COMMUNITY): Payer: Self-pay | Admitting: Family Medicine

## 2023-07-31 ENCOUNTER — Other Ambulatory Visit: Payer: Self-pay | Admitting: Family Medicine

## 2023-07-31 ENCOUNTER — Ambulatory Visit (HOSPITAL_COMMUNITY)
Admission: RE | Admit: 2023-07-31 | Discharge: 2023-07-31 | Disposition: A | Discharge status: Home / Self Care | Payer: Medicaid Other | Source: Ambulatory Visit | Attending: Family Medicine | Admitting: Family Medicine

## 2023-07-31 DIAGNOSIS — Z3201 Encounter for pregnancy test, result positive: Secondary | ICD-10-CM

## 2023-08-06 ENCOUNTER — Other Ambulatory Visit (HOSPITAL_COMMUNITY): Payer: Medicaid Other

## 2023-08-08 ENCOUNTER — Other Ambulatory Visit (HOSPITAL_COMMUNITY)
Admission: RE | Admit: 2023-08-08 | Discharge: 2023-08-08 | Disposition: A | Discharge status: Home / Self Care | Payer: Medicaid Other | Source: Ambulatory Visit | Attending: Certified Nurse Midwife | Admitting: Certified Nurse Midwife

## 2023-08-08 ENCOUNTER — Other Ambulatory Visit: Payer: Self-pay

## 2023-08-08 ENCOUNTER — Ambulatory Visit (INDEPENDENT_AMBULATORY_CARE_PROVIDER_SITE_OTHER): Payer: Medicaid Other | Admitting: Certified Nurse Midwife

## 2023-08-08 VITALS — BP 134/99 | HR 84 | Wt 378.0 lb

## 2023-08-08 DIAGNOSIS — I10 Essential (primary) hypertension: Secondary | ICD-10-CM | POA: Diagnosis not present

## 2023-08-08 DIAGNOSIS — Z3A12 12 weeks gestation of pregnancy: Secondary | ICD-10-CM | POA: Diagnosis not present

## 2023-08-08 DIAGNOSIS — O26851 Spotting complicating pregnancy, first trimester: Secondary | ICD-10-CM | POA: Diagnosis present

## 2023-08-08 DIAGNOSIS — B9689 Other specified bacterial agents as the cause of diseases classified elsewhere: Secondary | ICD-10-CM

## 2023-08-08 DIAGNOSIS — O0991 Supervision of high risk pregnancy, unspecified, first trimester: Secondary | ICD-10-CM | POA: Diagnosis not present

## 2023-08-08 DIAGNOSIS — N760 Acute vaginitis: Secondary | ICD-10-CM

## 2023-08-08 DIAGNOSIS — Z3009 Encounter for other general counseling and advice on contraception: Secondary | ICD-10-CM

## 2023-08-08 DIAGNOSIS — Z3491 Encounter for supervision of normal pregnancy, unspecified, first trimester: Secondary | ICD-10-CM

## 2023-08-08 DIAGNOSIS — O30041 Twin pregnancy, dichorionic/diamniotic, first trimester: Secondary | ICD-10-CM

## 2023-08-08 DIAGNOSIS — Z98891 History of uterine scar from previous surgery: Secondary | ICD-10-CM

## 2023-08-08 DIAGNOSIS — O0990 Supervision of high risk pregnancy, unspecified, unspecified trimester: Secondary | ICD-10-CM

## 2023-08-08 MED ORDER — NIFEDIPINE ER OSMOTIC RELEASE 30 MG PO TB24: 30.0000 mg | ORAL_TABLET | Freq: Every day | ORAL | 11 refills | Status: DC

## 2023-08-08 MED ORDER — ASPIRIN 81 MG PO TBEC
162.0000 mg | DELAYED_RELEASE_TABLET | Freq: Every day | ORAL | 12 refills | Status: DC
Start: 2023-08-08 — End: 2024-01-31

## 2023-08-08 NOTE — Progress Notes (Signed)
History:   Brandy Brock is a 27 y.o. G3P2002 at [redacted]w[redacted]d by LMP, early ultrasound being seen today for her first obstetrical visit.  Her obstetrical history is significant for obesity, pre-eclampsia, and two prior Cesarean deliveries . Would like a repeat Cesarean with BTL. Patient does intend to breast feed. Pregnancy history fully reviewed.  Patient reports no complaints. Feeling well this morning but did have a headache yesterday and one this morning that has since abated. Had nausea in the beginning of the pregnancy but feels well now.   HISTORY: OB History  Gravida Para Term Preterm AB Living  3 2 2  0 0 2  SAB IAB Ectopic Multiple Live Births  0 0 0 0 2    # Outcome Date GA Lbr Len/2nd Weight Sex Type Anes PTL Lv  3 Current           2 Term 09/15/22 [redacted]w[redacted]d  6 lb 15.1 oz (3.15 kg) M CS-LTranv Spinal  LIV     Name: YARDLEY, BELTRAN     Apgar1: 8  Apgar5: 9  1 Term 03/17/21 [redacted]w[redacted]d  5 lb 12.9 oz (2.634 kg) M CS-LTranv Spinal  LIV     Birth Comments: WNL     Name: Freddie Apley     Apgar1: 8  Apgar5: 9    Last pap smear was done 04/22/22 and was normal  Past Medical History:  Diagnosis Date   Asthma    childhood   Asthma 10/13/2008   Qualifier: Diagnosis of   By: Quincy Carnes   Have albuterol but has not used in pregnancy           Chlamydia    Chronic hypertension affecting pregnancy    Reports history of HTN but is not on medication at this time  10-02-20 157/89 and 143/89 at [facility] department     [facility]/MFM Guidelines for Antenatal Testing and Sonography                                                         Updated  01/29/2021 with Dr. Noralee Space           INDICATION    Growth U/S    BPP weekly    DELIVERY RECOMMENDATION (GA)                            CHTN    Group I   BP < 14   Contraception management 10/28/2022   Elevated liver function tests 12/11/2020   Elevated in MAU on 1/7 - repeat beginning of February    History of postpartum dehiscence of wound  04/22/2022   Hypertension    LGSIL of cervix of undetermined significance    Postpartum care following cesarean delivery 10/28/2022   Pregnancy induced hypertension    Status post repeat low transverse cesarean section 04/30/2021   03/2021 LTCS: arrest of dilation 7cm   Syphilis affecting pregnancy    Past Surgical History:  Procedure Laterality Date   CESAREAN SECTION N/A 03/17/2021   Procedure: CESAREAN SECTION;  Surgeon: [place] Bing, MD;  [facility];  Service: Obstetrics;  Laterality: N/A;   CESAREAN SECTION N/A 09/15/2022   Procedure: CESAREAN SECTION;  Surgeon: [place] Bing, MD;  Location: [facility]S;  Service: Obstetrics;  Laterality: N/A;   MULTIPLE EXTRACTIONS WITH ALVEOLOPLASTY N/A 10/11/2016   Procedure: MULTIPLE EXTRACTION WITH ALVEOLOPLASTY;  Surgeon: Ocie Doyne, DDS;  Location: [facility] OR;  Service: Oral Surgery;  Laterality: N/A;   Family History  Problem Relation Age of Onset   Healthy Mother    Diabetes Father    Diabetes Brother    Social History   Tobacco Use   Smoking status: Never   Smokeless tobacco: Former  Building services engineer status: Never Used  Substance Use Topics   Alcohol use: Not Currently   Drug use: No   Allergies  Allergen Reactions   Peanut-Containing Drug Products Itching and Swelling    Facial itching and swelling.   Current Outpatient Medications on File Prior to Visit  Medication Sig Dispense Refill   Blood Pressure Monitoring (BLOOD PRESSURE KIT) DEVI 1 each by Does not apply route as needed. 1 each 0   Prenatal Vit-Fe Fumarate-FA (PREPLUS) 27-1 MG TABS Take 1 tablet by mouth daily. 30 tablet 11   Doxylamine-Pyridoxine 10-10 MG TBEC Take 1 tablet by mouth 3 (three) times daily. (Patient not taking: Reported on 07/07/2023) 90 tablet 0   No current facility-administered medications on file prior to visit.    Review of Systems Pertinent items noted in HPI and remainder of comprehensive ROS otherwise negative. Physical Exam:    Vitals:   08/08/23 1018 08/08/23 1103  BP: (!) 157/98 (!) 134/99  Pulse: 86 84  Weight: (!) 378 lb (171.5 kg)    Pt informed that the ultrasound is considered a limited OB ultrasound and is not intended to be a complete ultrasound exam.  Patient also informed that the ultrasound is not being completed with the intent of assessing for fetal or placental anomalies or any pelvic abnormalities.  Explained that the purpose of today's ultrasound is to assess for   fetal heart tones, could not be Dopplered due to maternal habitus .  Patient acknowledges the purpose of the exam and the limitations of the study.    Fetal heart rate and movement easily noted on bedside u/s. Possible subchorionic hematoma noted.  Constitutional: Well-developed, well-nourished pregnant female in no acute distress.  HEENT: PERRLA Skin: normal color and turgor, no rash Cardiovascular: normal rate & rhythm, warm and well perfused Respiratory: normal effort, no problems with respiration noted GI: Abd soft, non-distended MS: Extremities nontender, no edema, normal ROM Neurologic: Alert and oriented x 4.  GU: no CVA tenderness Pelvic: Exam deferred  Assessment:   Pregnancy: [id number] Patient Active Problem List   Diagnosis Date Noted   Supervision of high risk pregnancy, antepartum 07/29/2023   Dichorionic diamniotic twin pregnancy 07/01/2023   Chronic hypertension 07/01/2023   History of syphilis 04/22/2022   BMI 60.0-69.9, adult (HCC) 10/13/2008     Plan:   1. Supervision of high risk pregnancy in first trimester - Doing well, has not started feeling fetal movement - CBC/D/Plt+RPR+Rh+ABO+RubIgG... - Hemoglobin A1c - Culture, OB Urine - PANORAMA PRENATAL TEST - [place] Basic Panel  2. [redacted] weeks gestation of pregnancy - Routine OB care, will offer AFP at next visit  3. Chronic hypertension - Elevated in MAU, now elevated today x2 - Protein / creatinine ratio, urine - Comprehensive metabolic panel -  aspirin EC 81 MG tablet; Take 2 tablets (162 mg total) by mouth daily. Swallow whole.  Dispense: 30 tablet; Refill: 12 - NIFEdipine (PROCARDIA XL) 30 MG 24 hr tablet; Take 1 tablet (30 mg total) by mouth daily.  Dispense: 30 tablet; Refill: 11  4. Spotting affecting pregnancy in first trimester - Possible [facility] noted on bedside U/S, precautions given - Cervicovaginal ancillary only( [place])  5. Dichorionic diamniotic twin pregnancy in first trimester - Referred to MFM for anatomy scan and antenatal testing  6. History of 2 cesarean sections - Would like a repeat Cesarean  7. Birth control counseling - Desires a BTL at the time of delivery, will have her sign consent at 26 weeks  8. Initial obstetric visit in first trimester - Initial labs drawn. - Continue prenatal vitamins. - Problem list reviewed and updated. - Genetic Screening discussed, First trimester screen, Quad screen, and NIPS: ordered. - Ultrasound discussed; fetal anatomic survey: ordered. - Anticipatory guidance about prenatal visits given including labs, ultrasounds, and testing. - Discussed usage of Babyscripts and virtual visits as additional source of managing and completing prenatal visits in midst of coronavirus and pandemic.   - Encouraged to complete MyChart Registration for her ability to review results, send requests, and have questions addressed.  - The nature of [place] [facility] for [facility] Healthcare/Faculty Practice with multiple MDs and Advanced Practice Providers was explained to patient; also emphasized that residents, students are part of our team. Desires to stay with midwifery care. - Routine obstetric precautions reviewed. Encouraged to seek out care at office or emergency room [facility] MAU preferred) for urgent and/or emergent concerns.  Return in about 4 weeks (around 09/05/2023) for IN-PERSON, HOB.    Future Appointments  Date Time Provider Department Center  09/03/2023 11:15 AM Osborne Oman [facility]  09/24/2023  8:15 AM [facility] NURSE [facility]  09/24/2023  8:30 AM [facility] US5 [facility]  10/01/2023 11:15 AM Bernerd Limbo, CNM [facility]    Edd Arbour, MSN, CNM, IBCLC Certified Nurse Midwife, [facility]

## 2023-08-09 LAB — PROTEIN / CREATININE RATIO, URINE
Creatinine, Urine: 92.9 mg/dL
Protein, Ur: 12.3 mg/dL
Protein/Creat Ratio: 132 mg/g{creat} (ref 0–200)

## 2023-08-10 LAB — URINE CULTURE, OB REFLEX

## 2023-08-10 LAB — CULTURE, OB URINE

## 2023-08-11 ENCOUNTER — Encounter: Payer: Self-pay | Admitting: *Deleted

## 2023-08-11 LAB — CERVICOVAGINAL ANCILLARY ONLY
Bacterial Vaginitis (gardnerella): POSITIVE — AB
Candida Glabrata: NEGATIVE
Candida Vaginitis: NEGATIVE
Chlamydia: NEGATIVE
Comment: NEGATIVE
Comment: NEGATIVE
Comment: NEGATIVE
Comment: NEGATIVE
Comment: NEGATIVE
Comment: NORMAL
Neisseria Gonorrhea: NEGATIVE
Trichomonas: NEGATIVE

## 2023-08-13 LAB — CBC/D/PLT+RPR+RH+ABO+RUBIGG...
Antibody Screen: NEGATIVE
Basophils Absolute: 0 10*3/uL (ref 0.0–0.2)
Basos: 0 %
EOS (ABSOLUTE): 0 10*3/uL (ref 0.0–0.4)
Eos: 0 %
HCV Ab: NONREACTIVE
HIV Screen 4th Generation wRfx: NONREACTIVE
Hematocrit: 34.5 % (ref 34.0–46.6)
Hemoglobin: 11.6 g/dL (ref 11.1–15.9)
Hepatitis B Surface Ag: NEGATIVE
Immature Grans (Abs): 0 10*3/uL (ref 0.0–0.1)
Immature Granulocytes: 0 %
Lymphocytes Absolute: 1.7 10*3/uL (ref 0.7–3.1)
Lymphs: 16 %
MCH: 30.4 pg (ref 26.6–33.0)
MCHC: 33.6 g/dL (ref 31.5–35.7)
MCV: 91 fL (ref 79–97)
Monocytes Absolute: 0.7 10*3/uL (ref 0.1–0.9)
Monocytes: 7 %
Neutrophils Absolute: 8.1 10*3/uL — ABNORMAL HIGH (ref 1.4–7.0)
Neutrophils: 77 %
Platelets: 221 10*3/uL (ref 150–450)
RBC: 3.81 x10E6/uL (ref 3.77–5.28)
RDW: 13.3 % (ref 11.7–15.4)
RPR Ser Ql: REACTIVE — AB
Rh Factor: POSITIVE
Rubella Antibodies, IGG: 10.2 {index} (ref >0.99)
WBC: 10.7 10*3/uL (ref 3.4–10.8)

## 2023-08-13 LAB — COMPREHENSIVE METABOLIC PANEL
ALT: 9 [IU]/L (ref 0–32)
AST: 14 [IU]/L (ref 0–40)
Albumin: 3.7 g/dL — ABNORMAL LOW (ref 4.0–5.0)
Alkaline Phosphatase: 58 [IU]/L (ref 44–121)
BUN/Creatinine Ratio: 8 — ABNORMAL LOW (ref 9–23)
BUN: 4 mg/dL — ABNORMAL LOW (ref 6–20)
Bilirubin Total: 0.4 mg/dL (ref 0.0–1.2)
CO2: 19 mmol/L — ABNORMAL LOW (ref 20–29)
Calcium: 9.6 mg/dL (ref 8.7–10.2)
Chloride: 100 mmol/L (ref 96–106)
Creatinine, Ser: 0.51 mg/dL — ABNORMAL LOW (ref 0.57–1.00)
Globulin, Total: 3.4 g/dL (ref 1.5–4.5)
Glucose: 96 mg/dL (ref 70–99)
Potassium: 4.3 mmol/L (ref 3.5–5.2)
Sodium: 134 mmol/L (ref 134–144)
Total Protein: 7.1 g/dL (ref 6.0–8.5)
eGFR: 132 mL/min/{1.73_m2} (ref >59)

## 2023-08-13 LAB — HEMOGLOBIN A1C
Est. average glucose Bld gHb Est-mCnc: 97 mg/dL
Hgb A1c MFr Bld: 5 % (ref 4.8–5.6)

## 2023-08-13 LAB — RPR, QUANT+TP ABS (REFLEX)
Rapid Plasma Reagin, Quant: 1:1 {titer} — ABNORMAL HIGH
T Pallidum Abs: REACTIVE — AB

## 2023-08-13 LAB — HCV INTERPRETATION

## 2023-08-13 MED ORDER — METRONIDAZOLE 500 MG PO TABS
500.0000 mg | ORAL_TABLET | Freq: Two times a day (BID) | ORAL | 0 refills | Status: DC
Start: 2023-08-13 — End: 2023-09-24

## 2023-08-13 NOTE — Addendum Note (Signed)
Addended by: Edd Arbour on: 08/13/2023 08:30 AM   Modules accepted: Orders

## 2023-08-19 DIAGNOSIS — Z0289 Encounter for other administrative examinations: Secondary | ICD-10-CM

## 2023-08-19 LAB — PANORAMA PRENATAL TEST FULL PANEL:PANORAMA TEST PLUS 5 ADDITIONAL MICRODELETIONS
FETAL FRACTION SECOND FETUS: 2.3
FETAL FRACTION: 3.5

## 2023-08-26 LAB — HORIZON CUSTOM: REPORT SUMMARY: NEGATIVE

## 2023-09-03 ENCOUNTER — Other Ambulatory Visit: Payer: Self-pay

## 2023-09-03 ENCOUNTER — Ambulatory Visit (INDEPENDENT_AMBULATORY_CARE_PROVIDER_SITE_OTHER): Payer: Medicaid Other | Admitting: Certified Nurse Midwife

## 2023-09-03 VITALS — BP 132/63 | HR 91 | Wt 376.0 lb

## 2023-09-03 DIAGNOSIS — O10919 Unspecified pre-existing hypertension complicating pregnancy, unspecified trimester: Secondary | ICD-10-CM

## 2023-09-03 DIAGNOSIS — Z3A16 16 weeks gestation of pregnancy: Secondary | ICD-10-CM

## 2023-09-03 DIAGNOSIS — O0992 Supervision of high risk pregnancy, unspecified, second trimester: Secondary | ICD-10-CM

## 2023-09-03 DIAGNOSIS — O10912 Unspecified pre-existing hypertension complicating pregnancy, second trimester: Secondary | ICD-10-CM

## 2023-09-03 DIAGNOSIS — O26892 Other specified pregnancy related conditions, second trimester: Secondary | ICD-10-CM

## 2023-09-03 DIAGNOSIS — R519 Headache, unspecified: Secondary | ICD-10-CM

## 2023-09-03 DIAGNOSIS — O30042 Twin pregnancy, dichorionic/diamniotic, second trimester: Secondary | ICD-10-CM

## 2023-09-03 NOTE — Progress Notes (Signed)
   PRENATAL VISIT NOTE  Subjective:  Brandy Brock is a 27 y.o. G3P2002 at [redacted]w[redacted]d being seen today for ongoing prenatal care.  She is currently monitored for the following issues for this high-risk pregnancy and has BMI 60.0-69.9, adult (HCC); History of syphilis; Dichorionic diamniotic twin pregnancy; Chronic hypertension; and Supervision of high risk pregnancy, antepartum on their problem list.  Patient reports  frequent headaches, not taking anything but has not been related to BP (always normal when she checks) .  Contractions: Not present. Vag. Bleeding: None.  Movement: Absent. Denies leaking of fluid.   The following portions of the patient's history were reviewed and updated as appropriate: allergies, current medications, past family history, past medical history, past social history, past surgical history and problem list.   Objective:   Vitals:   09/03/23 1143  BP: 132/63  Pulse: 91  Weight: (!) 376 lb (170.6 kg)   Fetal Status: Fetal Heart Rate (bpm): BUS/BUS   Movement: Absent     General:  Alert, oriented and cooperative. Patient is in no acute distress.  Skin: Skin is warm and dry. No rash noted.   Cardiovascular: Normal heart rate noted  Respiratory: Normal respiratory effort, no problems with respiration noted  Abdomen: Soft, gravid, appropriate for gestational age.  Pain/Pressure: Absent     Pelvic: Cervical exam deferred        Extremities: Normal range of motion.  Edema: None  Mental Status: Normal mood and affect. Normal behavior. Normal judgment and thought content.   RN unable to Doppler FHT due to maternal habitus so bedside ultrasound brought to room. Pt informed that the ultrasound is considered a limited OB ultrasound and is not intended to be a complete ultrasound exam.  Patient also informed that the ultrasound is not being completed with the intent of assessing for fetal or placental anomalies or any pelvic abnormalities.  Explained that the purpose of  today's ultrasound is to assess for   fetal heart rates .  Patient acknowledges the purpose of the exam and the limitations of the study.    Fetal heart tones noted in both babies as well as movement. Assessment and Plan:  Pregnancy: G3P2002 at [redacted]w[redacted]d 1. Supervision of high risk pregnancy in second trimester - Doing well overall, Panorama resulted with low fetal fraction - amenable to redraw today - PANORAMA PRENATAL TEST  2. [redacted] weeks gestation of pregnancy - Routine OB care  - AFP, Serum, Open Spina Bifida  3. Dichorionic diamniotic twin pregnancy in second trimester - Heart tones and movement noted on ultrasound  4. 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 - Suggested going outside for fresh air and sun at least once during shifts to get relief from florescent lights  5. Chronic hypertension affecting pregnancy - Stable on procardia. Taking daily aspirin.  Preterm labor symptoms and general obstetric precautions including but not limited to vaginal bleeding, contractions, leaking of fluid and fetal movement were reviewed in detail with the patient. Please refer to After Visit Summary for other counseling recommendations.   Return in about 4 weeks (around 10/01/2023) for IN-PERSON, HOB.  Future Appointments  Date Time Provider Department Center  09/24/2023  8:15 AM [facility] NURSE [facility]  09/24/2023  8:30 AM [facility] US5 [facility]  10/01/2023 11:15 AM Bernerd Limbo, CNM [facility]    Bernerd Limbo, CNM

## 2023-09-06 LAB — AFP, SERUM, OPEN SPINA BIFIDA
AFP MoM: 1.65
AFP Value: 37.6 ng/mL
Gest. Age on Collection Date: 16 wk
Maternal Age At EDD: 27.3 a
OSBR Risk 1 IN: 3696
Test Results:: NEGATIVE
Weight: 376 [lb_av]

## 2023-09-10 DIAGNOSIS — O09299 Supervision of pregnancy with other poor reproductive or obstetric history, unspecified trimester: Secondary | ICD-10-CM | POA: Insufficient documentation

## 2023-09-10 DIAGNOSIS — O99210 Obesity complicating pregnancy, unspecified trimester: Secondary | ICD-10-CM | POA: Insufficient documentation

## 2023-09-10 DIAGNOSIS — Z8759 Personal history of other complications of pregnancy, childbirth and the puerperium: Secondary | ICD-10-CM | POA: Insufficient documentation

## 2023-09-10 DIAGNOSIS — Z98891 History of uterine scar from previous surgery: Secondary | ICD-10-CM | POA: Insufficient documentation

## 2023-09-12 LAB — PANORAMA PRENATAL TEST FULL PANEL:PANORAMA TEST PLUS 5 ADDITIONAL MICRODELETIONS
FETAL FRACTION SECOND FETUS: 3.7
FETAL FRACTION: 4.6

## 2023-09-15 ENCOUNTER — Encounter: Payer: Self-pay | Admitting: Certified Nurse Midwife

## 2023-09-24 ENCOUNTER — Other Ambulatory Visit: Payer: Self-pay

## 2023-09-24 ENCOUNTER — Other Ambulatory Visit: Payer: Self-pay | Admitting: *Deleted

## 2023-09-24 ENCOUNTER — Ambulatory Visit: Payer: Medicaid Other | Attending: Maternal & Fetal Medicine | Admitting: Maternal & Fetal Medicine

## 2023-09-24 ENCOUNTER — Ambulatory Visit: Payer: Medicaid Other | Attending: Certified Nurse Midwife

## 2023-09-24 ENCOUNTER — Ambulatory Visit: Payer: Medicaid Other | Admitting: *Deleted

## 2023-09-24 ENCOUNTER — Ambulatory Visit: Payer: Medicaid Other

## 2023-09-24 ENCOUNTER — Encounter: Payer: Self-pay | Admitting: *Deleted

## 2023-09-24 VITALS — BP 157/74 | HR 87

## 2023-09-24 VITALS — BP 134/73

## 2023-09-24 DIAGNOSIS — O99210 Obesity complicating pregnancy, unspecified trimester: Secondary | ICD-10-CM

## 2023-09-24 DIAGNOSIS — O99212 Obesity complicating pregnancy, second trimester: Secondary | ICD-10-CM

## 2023-09-24 DIAGNOSIS — Z3A18 18 weeks gestation of pregnancy: Secondary | ICD-10-CM | POA: Diagnosis not present

## 2023-09-24 DIAGNOSIS — O30043 Twin pregnancy, dichorionic/diamniotic, third trimester: Secondary | ICD-10-CM

## 2023-09-24 DIAGNOSIS — O30042 Twin pregnancy, dichorionic/diamniotic, second trimester: Secondary | ICD-10-CM

## 2023-09-24 DIAGNOSIS — O35EXX2 Maternal care for other (suspected) fetal abnormality and damage, fetal genitourinary anomalies, fetus 2: Secondary | ICD-10-CM

## 2023-09-24 DIAGNOSIS — I10 Essential (primary) hypertension: Secondary | ICD-10-CM | POA: Diagnosis not present

## 2023-09-24 DIAGNOSIS — O10012 Pre-existing essential hypertension complicating pregnancy, second trimester: Secondary | ICD-10-CM

## 2023-09-24 DIAGNOSIS — O0990 Supervision of high risk pregnancy, unspecified, unspecified trimester: Secondary | ICD-10-CM | POA: Insufficient documentation

## 2023-09-24 DIAGNOSIS — Z98891 History of uterine scar from previous surgery: Secondary | ICD-10-CM

## 2023-09-24 DIAGNOSIS — O09299 Supervision of pregnancy with other poor reproductive or obstetric history, unspecified trimester: Secondary | ICD-10-CM | POA: Diagnosis present

## 2023-09-24 DIAGNOSIS — O10912 Unspecified pre-existing hypertension complicating pregnancy, second trimester: Secondary | ICD-10-CM

## 2023-09-24 DIAGNOSIS — O34219 Maternal care for unspecified type scar from previous cesarean delivery: Secondary | ICD-10-CM

## 2023-09-24 DIAGNOSIS — O35BXX2 Maternal care for other (suspected) fetal abnormality and damage, fetal cardiac anomalies, fetus 2: Secondary | ICD-10-CM

## 2023-09-24 DIAGNOSIS — O35BXX1 Maternal care for other (suspected) fetal abnormality and damage, fetal cardiac anomalies, fetus 1: Secondary | ICD-10-CM

## 2023-09-24 DIAGNOSIS — O35EXX1 Maternal care for other (suspected) fetal abnormality and damage, fetal genitourinary anomalies, fetus 1: Secondary | ICD-10-CM

## 2023-09-24 NOTE — Progress Notes (Unsigned)
   Patient information  Patient Name: Brandy Brock  Patient MRN:   [id number]  Referring practice: {MFM Referring Provider:[id number]}  MFM CONSULT  CARMEN WHERLEY is a 27 y.o. G3P2002 at [redacted]w[redacted]d here for ultrasound and consultation.   Sonographic findings Di-di intrauterine pregnancy at 18w 2d  Fetal cardiac activity: A: Observed, B: Observed. Presentation: A: Cephalic, B: Transverse, head to maternal left. Fetal anatomy that was well seen appears normal without evidence of soft markers. Not all fetal structures were well seen due to a technically diffcult exam and the anatomic survey remains incomplete with limited views of the ***.  Fetal biometry shows: (estimated fetal weight): A: 66, B: 57 percentile.  Amniotic fluid volume: A: Within normal limits, B: Within normal limits. Placenta: A: Posterior, B: Posterior. Cervix: Normal appearance by transabdominal scan with a cervical length of 3.3. Assessment @PROBLIST @ Plan -Detailed ultrasound was done today without abnormalities *** -***Baseline preeclampsia labs: CMP, CBC and urine protein/creatinine ratio if not previously completed.  -***Early glucose screening due to multiple risk factors. -***Continue Aspirin 81 mg for preeclampsia prophylasis -Follow-up anatomy and fetal growth in 4 to 6 weeks -Serial growth ultrasounds starting around 28 weeks to monitor for fetal growth restriction -Fetal echo has been arranged at *** -Antenatal testing to start around 32 weeks due to the increased risk of stillbirth and high risk pregnancy -Delivery timing pending clinical course but likely around *** weeks gestion -Continue routine prenatal care with referring OB provider  Review of Systems: A review of systems was performed and was negative except per HPI   Vitals and Physical Exam    09/24/2023    9:25 AM 09/24/2023    8:17 AM 09/03/2023   11:43 AM  Vitals with BMI  Weight   376 lbs  Systolic 134 157 409  Diastolic 73 74 63   Pulse  87 91    Sitting comfortably on the sonogram table Nonlabored breathing Normal rate and rhythm Abdomen is nontender  Past pregnancies OB History  Gravida Para Term Preterm AB Living  3 2 2  0 0 2  SAB IAB Ectopic Multiple Live Births  0 0 0 0 2    # Outcome Date GA Lbr Len/2nd Weight Sex Type Anes PTL Lv  3 Current           2 Term 09/15/22 [redacted]w[redacted]d  3.15 kg M CS-LTranv Spinal  LIV  1 Term 03/17/21 [redacted]w[redacted]d  2.634 kg M CS-LTranv Spinal  LIV     Birth Comments: WNL      I spent *** minutes reviewing the patients chart, including labs and images as well as counseling the patient about her medical conditions. Greater than 50% of the time was spent in direct face-to-face patient counseling.  Braxton Feathers  MFM, [facility]   09/24/2023  12:46 PM

## 2023-09-30 NOTE — Progress Notes (Signed)
   PRENATAL VISIT NOTE  Subjective:  Brandy Brock is a 27 y.o. G3P2002 at [redacted]w[redacted]d being seen today for ongoing prenatal care.  She is currently monitored for the following issues for this high-risk pregnancy and has BMI 60.0-69.9, adult (HCC); History of syphilis; Dichorionic diamniotic twin pregnancy; Chronic hypertension; Supervision of low-risk pregnancy; History of pre-eclampsia in prior pregnancy, currently pregnant; History of 2 cesarean sections; and Obesity affecting pregnancy, antepartum on their problem list.  Patient reports no complaints.  Contractions: Not present. Vag. Bleeding: None.  Movement: Present. Denies leaking of fluid.   The following portions of the patient's history were reviewed and updated as appropriate: allergies, current medications, past family history, past medical history, past social history, past surgical history and problem list.   Objective:   Vitals:   10/01/23 1106  BP: (!) 148/60  Pulse: 86  Weight: (!) 388 lb (176 kg)   Fetal Status: Fetal Heart Rate (bpm): 156/154   Movement: Present     General:  Alert, oriented and cooperative. Patient is in no acute distress.  Skin: Skin is warm and dry. No rash noted.   Cardiovascular: Normal heart rate noted  Respiratory: Normal respiratory effort, no problems with respiration noted  Abdomen: Soft, gravid, appropriate for gestational age.  Pain/Pressure: Absent     Pelvic: Cervical exam deferred        Extremities: Normal range of motion.  Edema: None  Mental Status: Normal mood and affect. Normal behavior. Normal judgment and thought content.  Pt informed that the ultrasound is considered a limited OB ultrasound and is not intended to be a complete ultrasound exam.  Patient also informed that the ultrasound is not being completed with the intent of assessing for fetal or placental anomalies or any pelvic abnormalities.  Explained that the purpose of today's ultrasound is to assess for   FHR .  Patient  acknowledges the purpose of the exam and the limitations of the study.    Assessment and Plan:  Pregnancy: G3P2002 at 103w1d 1. Encounter for supervision of low-risk pregnancy in second trimester - Doing well, feeling regular fetal movement   2. [redacted] weeks gestation of pregnancy - Routine OB care   3. Dichorionic diamniotic twin pregnancy in second trimester - Could hear Baby A, needed u/s to visualize Baby B. Both babies moving vigorously  4. Chronic hypertension - Somewhat stable on meds, may consider adjusting if next weeks' values are borderline elevated again  5. History of pre-eclampsia in prior pregnancy, currently pregnant - Taking daily aspirin  6. History of 2 cesarean sections - Desires rCS with BTL  Preterm labor symptoms and general obstetric precautions including but not limited to vaginal bleeding, contractions, leaking of fluid and fetal movement were reviewed in detail with the patient. Please refer to After Visit Summary for other counseling recommendations.   Return in about 4 weeks (around 10/29/2023) for [facility].  Future Appointments  Date Time Provider Department Center  10/29/2023 11:15 AM [place]  11/06/2023  8:15 AM [facility] NURSE [facility]  11/06/2023  8:30 AM [facility] US2 [facility]    Bernerd Limbo, CNM

## 2023-10-01 ENCOUNTER — Other Ambulatory Visit: Payer: Self-pay

## 2023-10-01 ENCOUNTER — Ambulatory Visit: Payer: Medicaid Other | Attending: Maternal & Fetal Medicine

## 2023-10-01 ENCOUNTER — Ambulatory Visit: Payer: Medicaid Other | Admitting: Certified Nurse Midwife

## 2023-10-01 VITALS — BP 148/60 | HR 86 | Wt 388.0 lb

## 2023-10-01 DIAGNOSIS — E669 Obesity, unspecified: Secondary | ICD-10-CM

## 2023-10-01 DIAGNOSIS — Z98891 History of uterine scar from previous surgery: Secondary | ICD-10-CM

## 2023-10-01 DIAGNOSIS — Z3A19 19 weeks gestation of pregnancy: Secondary | ICD-10-CM

## 2023-10-01 DIAGNOSIS — O09299 Supervision of pregnancy with other poor reproductive or obstetric history, unspecified trimester: Secondary | ICD-10-CM

## 2023-10-01 DIAGNOSIS — O30042 Twin pregnancy, dichorionic/diamniotic, second trimester: Secondary | ICD-10-CM

## 2023-10-01 DIAGNOSIS — I10 Essential (primary) hypertension: Secondary | ICD-10-CM

## 2023-10-01 DIAGNOSIS — O10012 Pre-existing essential hypertension complicating pregnancy, second trimester: Secondary | ICD-10-CM

## 2023-10-01 DIAGNOSIS — Z3492 Encounter for supervision of normal pregnancy, unspecified, second trimester: Secondary | ICD-10-CM

## 2023-10-01 DIAGNOSIS — O34219 Maternal care for unspecified type scar from previous cesarean delivery: Secondary | ICD-10-CM

## 2023-10-01 DIAGNOSIS — O99212 Obesity complicating pregnancy, second trimester: Secondary | ICD-10-CM

## 2023-10-01 DIAGNOSIS — O09292 Supervision of pregnancy with other poor reproductive or obstetric history, second trimester: Secondary | ICD-10-CM

## 2023-10-24 ENCOUNTER — Encounter (HOSPITAL_COMMUNITY): Payer: Self-pay | Admitting: Obstetrics and Gynecology

## 2023-10-24 ENCOUNTER — Inpatient Hospital Stay (HOSPITAL_COMMUNITY)
Admission: AD | Admit: 2023-10-24 | Discharge: 2023-10-24 | Disposition: A | Discharge status: Home / Self Care | Payer: Medicaid Other | Attending: Obstetrics and Gynecology | Admitting: Obstetrics and Gynecology

## 2023-10-24 DIAGNOSIS — O26892 Other specified pregnancy related conditions, second trimester: Secondary | ICD-10-CM | POA: Insufficient documentation

## 2023-10-24 DIAGNOSIS — R102 Pelvic and perineal pain unspecified side: Secondary | ICD-10-CM | POA: Insufficient documentation

## 2023-10-24 DIAGNOSIS — O30042 Twin pregnancy, dichorionic/diamniotic, second trimester: Secondary | ICD-10-CM | POA: Insufficient documentation

## 2023-10-24 DIAGNOSIS — O10912 Unspecified pre-existing hypertension complicating pregnancy, second trimester: Secondary | ICD-10-CM | POA: Diagnosis not present

## 2023-10-24 DIAGNOSIS — O26899 Other specified pregnancy related conditions, unspecified trimester: Secondary | ICD-10-CM | POA: Insufficient documentation

## 2023-10-24 DIAGNOSIS — O212 Late vomiting of pregnancy: Secondary | ICD-10-CM | POA: Diagnosis present

## 2023-10-24 DIAGNOSIS — Z3A22 22 weeks gestation of pregnancy: Secondary | ICD-10-CM | POA: Diagnosis not present

## 2023-10-24 DIAGNOSIS — I10 Essential (primary) hypertension: Secondary | ICD-10-CM

## 2023-10-24 DIAGNOSIS — O219 Vomiting of pregnancy, unspecified: Secondary | ICD-10-CM | POA: Diagnosis not present

## 2023-10-24 DIAGNOSIS — N949 Unspecified condition associated with female genital organs and menstrual cycle: Secondary | ICD-10-CM | POA: Diagnosis not present

## 2023-10-24 HISTORY — DX: Vomiting of pregnancy, unspecified: O21.9

## 2023-10-24 LAB — URINALYSIS, ROUTINE W REFLEX MICROSCOPIC
Bilirubin Urine: NEGATIVE
Glucose, UA: NEGATIVE mg/dL
Hgb urine dipstick: NEGATIVE
Ketones, ur: 20 mg/dL — AB
Nitrite: NEGATIVE
Protein, ur: 30 mg/dL — AB
Specific Gravity, Urine: 1.02 (ref 1.005–1.030)
pH: 6 (ref 5.0–8.0)

## 2023-10-24 MED ORDER — NIFEDIPINE 10 MG PO CAPS: 30.0000 mg | ORAL_CAPSULE | Freq: Once | ORAL | Status: DC

## 2023-10-24 MED ORDER — ONDANSETRON 4 MG PO TBDP
4.0000 mg | ORAL_TABLET | Freq: Once | ORAL | Status: AC
  Administered 2023-10-24: 4 mg via ORAL
  Filled 2023-10-24: qty 1

## 2023-10-24 MED ORDER — NIFEDIPINE ER OSMOTIC RELEASE 30 MG PO TB24
30.0000 mg | ORAL_TABLET | Freq: Once | ORAL | Status: AC
  Administered 2023-10-24: 30 mg via ORAL
  Filled 2023-10-24: qty 1

## 2023-10-24 NOTE — MAU Provider Note (Signed)
History  Brandy Brock , a  27 y.o. G3P2002 at [redacted]w[redacted]d presents to MAU for c/o of N/V x1 that occurred today at ~1400 after eating a tuna sandwich from [place]. Patient reports feeling nauseous right before eating. She reports this is her first occurrence of nausea this trimester. She also reports RLQ abdominal discomfort and right lower back pain that radiates slightly down her right leg. She denies any pain right now at rest. She states she has not taken anything for her nausea or back pain. She reports elevated SBP 140-150s at home using her home BP cuff. She is on 30mg  XL Procardia daily but did not take it nor her LDA today, after her episode of vomiting.   RN note: Brandy Brock is a 27 y.o. at [redacted]w[redacted]d here in MAU reporting: Right sided lower abdomen and lower back pain that is intermittent that began this morning. She reports she mostly feels the pains with movement. She reports she has been nauseated and tried to eat around 1430 and she reports she vomited immediately. Denies VB or LOF. +flutters.   Di/Di twins. On procardia. Has not had today due to nausea.   Next OB appointment next week on 12/18.   Onset of complaint:  Pain score:  6/10 abdomen and back    CSN: [id number]  Arrival date and time: 10/24/23 1724   Event Date/Time   First Provider Initiated Contact with Patient 10/24/23 1832      Chief Complaint  Patient presents with   Nausea   Emesis   Vaginal Pain   Abdominal Pain   Emesis  This is a new problem. The current episode started today. The problem occurs less than 2 times per day. The problem has been rapidly improving. There has been no fever. Associated symptoms include abdominal pain. Pertinent negatives include no diarrhea, dizziness or headaches. She has tried nothing for the symptoms.  Vaginal Pain The patient's primary symptoms include pelvic pain. Associated symptoms include abdominal pain, back pain, nausea and vomiting. Pertinent negatives include no  diarrhea, dysuria, flank pain or headaches.  Abdominal Pain This is a new problem. The current episode started today. The onset quality is gradual. The problem occurs intermittently. The problem has been resolved. The pain is located in the RLQ. The patient is experiencing no pain. The quality of the pain is dull. The abdominal pain radiates to the back. Associated symptoms include nausea and vomiting. Pertinent negatives include no diarrhea, dysuria or headaches. The pain is aggravated by certain positions. She has tried nothing for the symptoms. Prior diagnostic workup includes ultrasound.    OB History     Gravida  3   Para  2   Term  2   Preterm  0   AB  0   Living  2      SAB  0   IAB  0   Ectopic  0   Multiple  0   Live Births  2           Past Medical History:  Diagnosis Date   Asthma    childhood   Asthma 10/13/2008   Qualifier: Diagnosis of   By: Quincy Carnes   Have albuterol but has not used in pregnancy           Chlamydia    Chronic hypertension affecting pregnancy    Reports history of HTN but is not on medication at this time  10-02-20 157/89 and 143/89 at [facility] department  [facility] Guidelines for Antenatal Testing and Sonography                                                         Updated  01/29/2021 with Dr. Noralee Space           INDICATION    Growth U/S    BPP weekly    DELIVERY RECOMMENDATION (GA)                            CHTN    Group I   BP < 14   Contraception management 10/28/2022   Elevated liver function tests 12/11/2020   Elevated in MAU on 1/7 - repeat beginning of February    History of postpartum dehiscence of wound 04/22/2022   Hypertension    LGSIL of cervix of undetermined significance    Postpartum care following cesarean delivery 10/28/2022   Pregnancy induced hypertension    Status post repeat low transverse cesarean section 04/30/2021   03/2021 LTCS: arrest of dilation 7cm   Syphilis affecting pregnancy     Past  Surgical History:  Procedure Laterality Date   CESAREAN SECTION N/A 03/17/2021   Procedure: CESAREAN SECTION;  Surgeon: [place] Bing, MD;  Location: [facility] LD ORS;  Service: Obstetrics;  Laterality: N/A;   CESAREAN SECTION N/A 09/15/2022   Procedure: CESAREAN SECTION;  Surgeon: [place] Bing, MD;  Location: [facility] LD ORS;  Service: Obstetrics;  Laterality: N/A;   MULTIPLE EXTRACTIONS WITH ALVEOLOPLASTY N/A 10/11/2016   Procedure: MULTIPLE EXTRACTION WITH ALVEOLOPLASTY;  Surgeon: Ocie Doyne, DDS;  Location: [facility] OR;  Service: Oral Surgery;  Laterality: N/A;    Family History  Problem Relation Age of Onset   Healthy Mother    Diabetes Father    Diabetes Brother     Social History   Tobacco Use   Smoking status: Never   Smokeless tobacco: Former  Building services engineer status: Never Used  Substance Use Topics   Alcohol use: Not Currently   Drug use: No    Allergies:  Allergies  Allergen Reactions   Peanut-Containing Drug Products Itching and Swelling    Facial itching and swelling.    Medications Prior to Admission  Medication Sig Dispense Refill Last Dose/Taking   aspirin EC 81 MG tablet Take 2 tablets (162 mg total) by mouth daily. Swallow whole. 30 tablet 12 10/23/2023 Noon   Blood Pressure Monitoring (BLOOD PRESSURE KIT) DEVI 1 each by Does not apply route as needed. 1 each 0 10/23/2023 Noon   Prenatal Vit-Fe Fumarate-FA (PREPLUS) 27-1 MG TABS Take 1 tablet by mouth daily. 30 tablet 11 10/23/2023 Noon   NIFEdipine (PROCARDIA XL) 30 MG 24 hr tablet Take 1 tablet (30 mg total) by mouth daily. 30 tablet 11     Review of Systems  Gastrointestinal:  Positive for abdominal pain, nausea and vomiting. Negative for diarrhea.  Genitourinary:  Positive for pelvic pain and vaginal pain. Negative for dysuria and flank pain.  Musculoskeletal:  Positive for back pain.  Neurological:  Negative for dizziness and headaches.   Today's Vitals   10/24/23 1916 10/24/23 1931 10/24/23 1946  10/24/23 2003  BP: 135/87 (!) 140/77 (!) 141/82   Pulse: 90 87 88   Resp:      Temp:  TempSrc:      SpO2:      Weight:      Height:      PainSc:    0-No pain   Body mass index is 59.92 kg/m.   Physical Exam   Blood pressure (!) 141/82, pulse 88, temperature 98.3 F (36.8 C), temperature source Oral, resp. rate 16, height 5\' 7"  (1.702 m), weight (!) 173.5 kg, last menstrual period 05/14/2023, SpO2 100%, not currently breastfeeding.  Physical Exam Constitutional:      General: She is not in acute distress.    Appearance: She is not ill-appearing.  Cardiovascular:     Rate and Rhythm: Normal rate.  Abdominal:     Tenderness: There is abdominal tenderness in the right lower quadrant.  Skin:    General: Skin is warm and dry.  Neurological:     Mental Status: She is alert and oriented to person, place, and time.  Psychiatric:        Mood and Affect: Mood normal.        Behavior: Behavior normal.     MAU Course  Procedures Bedside ultrasound: Twin A FHR 160s, Twin B FHR 140s  Orders Placed This Encounter  Procedures   Culture, OB Urine   Urinalysis, Routine w reflex microscopic -Urine, Clean Catch   Discharge patient    Results for orders placed or performed during the hospital encounter of 10/24/23 (from the past 24 hours)  Urinalysis, Routine w reflex microscopic -Urine, Clean Catch     Status: Abnormal   Collection Time: 10/24/23  5:55 PM  Result Value Ref Range   Color, Urine YELLOW YELLOW   APPearance HAZY (A) CLEAR   Specific Gravity, Urine 1.020 1.005 - 1.030   pH 6.0 5.0 - 8.0   Glucose, UA NEGATIVE NEGATIVE mg/dL   Hgb urine dipstick NEGATIVE NEGATIVE   Bilirubin Urine NEGATIVE NEGATIVE   Ketones, ur 20 (A) NEGATIVE mg/dL   Protein, ur 30 (A) NEGATIVE mg/dL   Nitrite NEGATIVE NEGATIVE   Leukocytes,Ua SMALL (A) NEGATIVE   RBC / HPF 0-5 0 - 5 RBC/hpf   WBC, UA 0-5 0 - 5 WBC/hpf   Bacteria, UA RARE (A) NONE SEEN   Squamous Epithelial / HPF 11-20 0 -  5 /HPF   Mucus PRESENT      MDM Nausea/vomiting in pregnancy Bladder/kidney infection, unspecified Round ligament pain Twin pregnancy Chronic Hypertension -PO challenge Tolerated liquids PO Denies N/V while in triage -UA: Protein 30, ketones 20; urine culture pending Denies flank pain or dysuria Afebrile Low suspicion for pyelo or UTI at this time -Round ligament pain Mild tenderness of RLQ with deep palpation. Unable to elicit pain with movement. Patient reports pain has mostly resolved since initial occurrence. -DiDi Twins Bedside ultrasound: Twin A & Twin B transverse with cephalic presentation maternal right. FM noted. FHR 160s & 140s -Hypertensive Denies RUQ pain/epigastric discomfort, HA or visual disturbances Zofran DGT 4mg  x1 Procardia 30mg  XL given x1  Discharge home.  Assessment and Plan  Nausea vomiting in pregnancy Encouraged small frequents meals Encouraged adequate PO hydration Chronic hypertension Procardia 30mg  XL in triage. Advised pt to take home dose tomorrow. Warning signs given and reviewed Round ligament pain -expectant management reviewed -Advised 30 mins of activity/day -OTC tylenol for sx relief Dichorionic diamniotic twin pregnancy -ultrasound scheduled 11/06/23  Preterm labor symptoms and general obstetric precautions including but not limited to vaginal bleeding, contractions, leaking of fluid and fetal movement were reviewed in detail with the patient.  Darrell Jewel, SNM 10/24/2023, 8:02 PM

## 2023-10-24 NOTE — Discharge Instructions (Signed)

## 2023-10-24 NOTE — MAU Note (Signed)
.  Brandy Brock is a 27 y.o. at [redacted]w[redacted]d here in MAU reporting: Right sided lower abdomen and lower back pain that is intermittent that began this morning. She reports she mostly feels the pains with movement. She reports she has been nauseated and tried to eat around 1430 and she reports she vomited immediately. Denies VB or LOF. +flutters.  Di/Di twins. On procardia. Has not had today due to nausea.  Next OB appointment next week on 12/18.  Onset of complaint:  Pain score:  6/10 abdomen and back  Vitals:   10/24/23 1735  BP: (!) 149/87  Pulse: 89  Resp: 16  Temp: 98.3 F (36.8 C)  SpO2: 100%     FHT: unable to doppler, Di/Di twins Lab orders placed from triage: UA

## 2023-10-26 LAB — CULTURE, OB URINE

## 2023-10-29 ENCOUNTER — Other Ambulatory Visit: Payer: Self-pay

## 2023-10-29 ENCOUNTER — Ambulatory Visit: Payer: Medicaid Other | Admitting: Certified Nurse Midwife

## 2023-10-29 VITALS — BP 126/73 | HR 86 | Wt 379.0 lb

## 2023-10-29 DIAGNOSIS — O219 Vomiting of pregnancy, unspecified: Secondary | ICD-10-CM

## 2023-10-29 DIAGNOSIS — O30042 Twin pregnancy, dichorionic/diamniotic, second trimester: Secondary | ICD-10-CM

## 2023-10-29 DIAGNOSIS — O0992 Supervision of high risk pregnancy, unspecified, second trimester: Secondary | ICD-10-CM

## 2023-10-29 DIAGNOSIS — R102 Pelvic and perineal pain: Secondary | ICD-10-CM

## 2023-10-29 DIAGNOSIS — O09292 Supervision of pregnancy with other poor reproductive or obstetric history, second trimester: Secondary | ICD-10-CM

## 2023-10-29 DIAGNOSIS — Z3A23 23 weeks gestation of pregnancy: Secondary | ICD-10-CM

## 2023-10-29 DIAGNOSIS — O26899 Other specified pregnancy related conditions, unspecified trimester: Secondary | ICD-10-CM

## 2023-10-29 DIAGNOSIS — O26892 Other specified pregnancy related conditions, second trimester: Secondary | ICD-10-CM

## 2023-10-29 DIAGNOSIS — O09299 Supervision of pregnancy with other poor reproductive or obstetric history, unspecified trimester: Secondary | ICD-10-CM

## 2023-10-31 NOTE — Progress Notes (Signed)
   PRENATAL VISIT NOTE  Subjective:  Brandy Brock is a 27 y.o. G3P2002 at [redacted]w[redacted]d being seen today for ongoing prenatal care.  She is currently monitored for the following issues for this high-risk pregnancy and has BMI 60.0-69.9, adult (HCC); History of syphilis; Dichorionic diamniotic twin pregnancy; Chronic hypertension; Supervision of high-risk pregnancy; History of pre-eclampsia in prior pregnancy, currently pregnant; History of 2 cesarean sections; Obesity affecting pregnancy, antepartum; Pain of round ligament affecting pregnancy, antepartum; and Nausea/vomiting in pregnancy on their problem list.  Patient reports no complaints, feeling better since MAU visit.  Contractions: Not present. Vag. Bleeding: None.  Movement: Present. Denies leaking of fluid.   The following portions of the patient's history were reviewed and updated as appropriate: allergies, current medications, past family history, past medical history, past social history, past surgical history and problem list.   Objective:   Vitals:   10/29/23 1214  BP: 126/73  Pulse: 86  Weight: (!) 379 lb (171.9 kg)   Fetal Status:     Movement: Present     General:  Alert, oriented and cooperative. Patient is in no acute distress.  Skin: Skin is warm and dry. No rash noted.   Cardiovascular: Normal heart rate noted  Respiratory: Normal respiratory effort, no problems with respiration noted  Abdomen: Soft, gravid, appropriate for gestational age.  Pain/Pressure: Absent     Pelvic: Cervical exam deferred        Extremities: Normal range of motion.  Edema: None  Mental Status: Normal mood and affect. Normal behavior. Normal judgment and thought content.   Pt informed that the ultrasound is considered a limited OB ultrasound and is not intended to be a complete ultrasound exam.  Patient also informed that the ultrasound is not being completed with the intent of assessing for fetal or placental anomalies or any pelvic  abnormalities.  Explained that the purpose of today's ultrasound is to assess for  viability.  Patient acknowledges the purpose of the exam and the limitations of the study. Cardiac and fetal movement noted on both babies.  Assessment and Plan:  Pregnancy: G3P2002 at [redacted]w[redacted]d 1. Supervision of high risk pregnancy in second trimester (Primary) - Doing well, feeling regular and vigorous fetal movement   2. [redacted] weeks gestation of pregnancy - Routine OB care including anticipatory guidance re GTT at next visit and need for fasting  3. Dichorionic diamniotic twin pregnancy in second trimester - Being followed by MFM  4. Nausea/vomiting in pregnancy - Resolved  5. Pain of round ligament affecting pregnancy, antepartum - Stable today  6. History of pre-eclampsia in prior pregnancy, currently pregnant - BP stable today  Preterm labor symptoms and general obstetric precautions including but not limited to vaginal bleeding, contractions, leaking of fluid and fetal movement were reviewed in detail with the patient. Please refer to After Visit Summary for other counseling recommendations.   Return in about 3 weeks (around 11/19/2023) for IN-PERSON, HOB/GTT.  Future Appointments  Date Time Provider Department Center  11/06/2023  8:15 AM [facility] NURSE [facility]  11/06/2023  8:30 AM [facility] US2 [facility]  11/26/2023  8:15 AM Bernerd Limbo, CNM [facility]  11/26/2023  8:50 AM [facility] LAB [facility]   Bernerd Limbo, CNM

## 2023-11-06 ENCOUNTER — Encounter: Payer: Self-pay | Admitting: *Deleted

## 2023-11-06 ENCOUNTER — Other Ambulatory Visit: Payer: Self-pay | Admitting: *Deleted

## 2023-11-06 ENCOUNTER — Ambulatory Visit: Payer: Medicaid Other | Attending: Maternal & Fetal Medicine

## 2023-11-06 ENCOUNTER — Ambulatory Visit: Payer: Medicaid Other | Admitting: *Deleted

## 2023-11-06 VITALS — BP 138/76 | HR 81

## 2023-11-06 DIAGNOSIS — O10912 Unspecified pre-existing hypertension complicating pregnancy, second trimester: Secondary | ICD-10-CM | POA: Insufficient documentation

## 2023-11-06 DIAGNOSIS — O3662X1 Maternal care for excessive fetal growth, second trimester, fetus 1: Secondary | ICD-10-CM

## 2023-11-06 DIAGNOSIS — Z3A24 24 weeks gestation of pregnancy: Secondary | ICD-10-CM

## 2023-11-06 DIAGNOSIS — O30043 Twin pregnancy, dichorionic/diamniotic, third trimester: Secondary | ICD-10-CM

## 2023-11-06 DIAGNOSIS — O219 Vomiting of pregnancy, unspecified: Secondary | ICD-10-CM | POA: Insufficient documentation

## 2023-11-06 DIAGNOSIS — O30042 Twin pregnancy, dichorionic/diamniotic, second trimester: Secondary | ICD-10-CM | POA: Diagnosis not present

## 2023-11-06 DIAGNOSIS — R102 Pelvic and perineal pain: Secondary | ICD-10-CM | POA: Insufficient documentation

## 2023-11-06 DIAGNOSIS — O99212 Obesity complicating pregnancy, second trimester: Secondary | ICD-10-CM | POA: Diagnosis present

## 2023-11-06 DIAGNOSIS — O3663X0 Maternal care for excessive fetal growth, third trimester, not applicable or unspecified: Secondary | ICD-10-CM

## 2023-11-06 DIAGNOSIS — O10012 Pre-existing essential hypertension complicating pregnancy, second trimester: Secondary | ICD-10-CM

## 2023-11-06 DIAGNOSIS — O35EXX1 Maternal care for other (suspected) fetal abnormality and damage, fetal genitourinary anomalies, fetus 1: Secondary | ICD-10-CM

## 2023-11-06 DIAGNOSIS — O34219 Maternal care for unspecified type scar from previous cesarean delivery: Secondary | ICD-10-CM

## 2023-11-06 DIAGNOSIS — O99213 Obesity complicating pregnancy, third trimester: Secondary | ICD-10-CM

## 2023-11-06 DIAGNOSIS — O26899 Other specified pregnancy related conditions, unspecified trimester: Secondary | ICD-10-CM | POA: Insufficient documentation

## 2023-11-06 DIAGNOSIS — O35BXX1 Maternal care for other (suspected) fetal abnormality and damage, fetal cardiac anomalies, fetus 1: Secondary | ICD-10-CM

## 2023-11-18 ENCOUNTER — Telehealth: Payer: Self-pay | Admitting: Maternal & Fetal Medicine

## 2023-11-18 NOTE — Telephone Encounter (Signed)
 Spoke w/Brandy Brock from [facility], to receive an update on FETAL ECHO REQUEST. Was informed patient has been contacted 3x via phone and a letter has been sent with no response from patient.

## 2023-11-25 ENCOUNTER — Other Ambulatory Visit: Payer: Self-pay

## 2023-11-25 DIAGNOSIS — O0992 Supervision of high risk pregnancy, unspecified, second trimester: Secondary | ICD-10-CM

## 2023-11-25 NOTE — Progress Notes (Signed)
 PRENATAL VISIT NOTE  Subjective:  Brandy Brock is a 28 y.o. G3P2002 at [redacted]w[redacted]d being seen today for ongoing prenatal care.  She is currently monitored for the following issues for this high-risk pregnancy and has BMI 60.0-69.9, adult (HCC); History of syphilis; Dichorionic diamniotic twin pregnancy; Chronic hypertension; Supervision of high-risk pregnancy; History of pre-eclampsia in prior pregnancy, currently pregnant; History of 2 cesarean sections; Obesity affecting pregnancy, antepartum; Pain of round ligament affecting pregnancy, antepartum; and Nausea/vomiting in pregnancy on their problem list.  Patient reports no complaints.  Contractions: Not present. Vag. Bleeding: None.  Movement: Present. Denies leaking of fluid.   The following portions of the patient's history were reviewed and updated as appropriate: allergies, current medications, past family history, past medical history, past social history, past surgical history and problem list.   Objective:   Vitals:   11/26/23 0850  BP: 134/80  Pulse: 92  Weight: (!) 377 lb 9.6 oz (171.3 kg)   Fetal Status:     Movement: Present     General:  Alert, oriented and cooperative. Patient is in no acute distress.  Skin: Skin is warm and dry. No rash noted.   Cardiovascular: Normal heart rate noted  Respiratory: Normal respiratory effort, no problems with respiration noted  Abdomen: Soft, gravid, appropriate for gestational age.  Pain/Pressure: Absent     Pelvic: Cervical exam deferred        Extremities: Normal range of motion.  Edema: None  Mental Status: Normal mood and affect. Normal behavior. Normal judgment and thought content.  Pt informed that the ultrasound is considered a limited OB ultrasound and is not intended to be a complete ultrasound exam.  Patient also informed that the ultrasound is not being completed with the intent of assessing for fetal or placental anomalies or any pelvic abnormalities.  Explained that the  purpose of today's ultrasound is to assess for   fetal heart rate, could not hear due to maternal habitus. Fetal movement and cardiac activity noted on both babies .  Patient acknowledges the purpose of the exam and the limitations of the study.    Assessment and Plan:  Pregnancy: G3P2002 at [redacted]w[redacted]d 1. Supervision of high risk pregnancy in second trimester (Primary) - Doing well, feeling regular and vigorous fetal movement   2. [redacted] weeks gestation of pregnancy - Routine OB care including GTT today  3. Chronic hypertension - Has stopped taking her procardia  because it makes her feel flushed, hot and dizzy. BP has been normal (here and at home) - Also taking daily aspirin  - Advised to take BP daily and resume procardia  if BP >135/85. If she continues to have side effects, we can switch to a different medication.  4. Dichorionic diamniotic twin pregnancy in second trimester - Being followed by MFM  5. History of 2 cesarean sections - Planning rCS w BTL, consent signed today  6. History of pre-eclampsia in prior pregnancy, currently pregnant - Currently stable  7. History of syphilis - Titers remain the same  Preterm labor symptoms and general obstetric precautions including but not limited to vaginal bleeding, contractions, leaking of fluid and fetal movement were reviewed in detail with the patient. Please refer to After Visit Summary for other counseling recommendations.   Return in about 2 weeks (around 12/10/2023) for IN-PERSON, HOB.  Future Appointments  Date Time Provider Department Center  12/04/2023  9:15 AM [facility] NURSE [facility]  12/04/2023  9:30 AM [facility] US6 [facility]  12/10/2023  4:15 PM  Cleophas Dadds [facility]  12/24/2023  3:15 PM Cleophas Dadds [facility]  01/01/2024  9:15 AM [facility] NURSE [facility]  01/01/2024  9:30 AM [facility] US2 [facility]    Derick Fleeting, CNM

## 2023-11-26 ENCOUNTER — Other Ambulatory Visit: Payer: Self-pay

## 2023-11-26 ENCOUNTER — Ambulatory Visit (INDEPENDENT_AMBULATORY_CARE_PROVIDER_SITE_OTHER): Payer: Medicaid Other | Admitting: Certified Nurse Midwife

## 2023-11-26 ENCOUNTER — Other Ambulatory Visit: Payer: Medicaid Other

## 2023-11-26 VITALS — BP 134/80 | HR 92 | Wt 377.6 lb

## 2023-11-26 DIAGNOSIS — O0992 Supervision of high risk pregnancy, unspecified, second trimester: Secondary | ICD-10-CM

## 2023-11-26 DIAGNOSIS — O30042 Twin pregnancy, dichorionic/diamniotic, second trimester: Secondary | ICD-10-CM

## 2023-11-26 DIAGNOSIS — Z8619 Personal history of other infectious and parasitic diseases: Secondary | ICD-10-CM

## 2023-11-26 DIAGNOSIS — Z3201 Encounter for pregnancy test, result positive: Secondary | ICD-10-CM

## 2023-11-26 DIAGNOSIS — Z98891 History of uterine scar from previous surgery: Secondary | ICD-10-CM

## 2023-11-26 DIAGNOSIS — Z3A27 27 weeks gestation of pregnancy: Secondary | ICD-10-CM

## 2023-11-26 DIAGNOSIS — I10 Essential (primary) hypertension: Secondary | ICD-10-CM

## 2023-11-26 DIAGNOSIS — O09292 Supervision of pregnancy with other poor reproductive or obstetric history, second trimester: Secondary | ICD-10-CM

## 2023-11-26 DIAGNOSIS — O09299 Supervision of pregnancy with other poor reproductive or obstetric history, unspecified trimester: Secondary | ICD-10-CM

## 2023-11-26 MED ORDER — PREPLUS 27-1 MG PO TABS: 1.0000 | ORAL_TABLET | Freq: Every day | ORAL | 11 refills | Status: DC

## 2023-11-27 LAB — CBC
Hematocrit: 31.9 % — ABNORMAL LOW (ref 34.0–46.6)
Hemoglobin: 10.3 g/dL — ABNORMAL LOW (ref 11.1–15.9)
MCH: 28.9 pg (ref 26.6–33.0)
MCHC: 32.3 g/dL (ref 31.5–35.7)
MCV: 90 fL (ref 79–97)
Platelets: 181 10*3/uL (ref 150–450)
RBC: 3.56 x10E6/uL — ABNORMAL LOW (ref 3.77–5.28)
RDW: 12.3 % (ref 11.7–15.4)
WBC: 11.5 10*3/uL — ABNORMAL HIGH (ref 3.4–10.8)

## 2023-11-27 LAB — RPR, QUANT+TP ABS (REFLEX)
Rapid Plasma Reagin, Quant: 1:1 {titer} — ABNORMAL HIGH
T Pallidum Abs: REACTIVE — AB

## 2023-11-27 LAB — RPR: RPR Ser Ql: REACTIVE — AB

## 2023-11-27 LAB — HIV ANTIBODY (ROUTINE TESTING W REFLEX): HIV Screen 4th Generation wRfx: NONREACTIVE

## 2023-11-27 LAB — GLUCOSE TOLERANCE, 2 HOURS W/ 1HR
Glucose, 1 hour: 116 mg/dL (ref 70–179)
Glucose, 2 hour: 56 mg/dL — ABNORMAL LOW (ref 70–152)
Glucose, Fasting: 74 mg/dL (ref 70–91)

## 2023-12-01 ENCOUNTER — Encounter: Payer: Self-pay | Admitting: *Deleted

## 2023-12-03 ENCOUNTER — Encounter: Payer: Self-pay | Admitting: Certified Nurse Midwife

## 2023-12-03 DIAGNOSIS — J45909 Unspecified asthma, uncomplicated: Secondary | ICD-10-CM

## 2023-12-04 ENCOUNTER — Other Ambulatory Visit: Payer: Self-pay | Admitting: *Deleted

## 2023-12-04 ENCOUNTER — Ambulatory Visit: Payer: Medicaid Other

## 2023-12-04 ENCOUNTER — Ambulatory Visit (HOSPITAL_BASED_OUTPATIENT_CLINIC_OR_DEPARTMENT_OTHER): Payer: Medicaid Other | Admitting: Obstetrics

## 2023-12-04 ENCOUNTER — Other Ambulatory Visit: Payer: Self-pay

## 2023-12-04 ENCOUNTER — Ambulatory Visit: Payer: Medicaid Other | Attending: Obstetrics and Gynecology | Admitting: *Deleted

## 2023-12-04 VITALS — BP 157/78

## 2023-12-04 DIAGNOSIS — O99213 Obesity complicating pregnancy, third trimester: Secondary | ICD-10-CM

## 2023-12-04 DIAGNOSIS — O30043 Twin pregnancy, dichorionic/diamniotic, third trimester: Secondary | ICD-10-CM | POA: Insufficient documentation

## 2023-12-04 DIAGNOSIS — O10913 Unspecified pre-existing hypertension complicating pregnancy, third trimester: Secondary | ICD-10-CM

## 2023-12-04 DIAGNOSIS — O3663X0 Maternal care for excessive fetal growth, third trimester, not applicable or unspecified: Secondary | ICD-10-CM

## 2023-12-04 DIAGNOSIS — O35BXX0 Maternal care for other (suspected) fetal abnormality and damage, fetal cardiac anomalies, not applicable or unspecified: Secondary | ICD-10-CM | POA: Diagnosis not present

## 2023-12-04 DIAGNOSIS — O219 Vomiting of pregnancy, unspecified: Secondary | ICD-10-CM

## 2023-12-04 DIAGNOSIS — Z3A28 28 weeks gestation of pregnancy: Secondary | ICD-10-CM

## 2023-12-04 DIAGNOSIS — O34219 Maternal care for unspecified type scar from previous cesarean delivery: Secondary | ICD-10-CM | POA: Diagnosis not present

## 2023-12-04 DIAGNOSIS — O10013 Pre-existing essential hypertension complicating pregnancy, third trimester: Secondary | ICD-10-CM

## 2023-12-04 DIAGNOSIS — O26899 Other specified pregnancy related conditions, unspecified trimester: Secondary | ICD-10-CM

## 2023-12-04 DIAGNOSIS — O35EXX0 Maternal care for other (suspected) fetal abnormality and damage, fetal genitourinary anomalies, not applicable or unspecified: Secondary | ICD-10-CM | POA: Diagnosis not present

## 2023-12-04 DIAGNOSIS — O30049 Twin pregnancy, dichorionic/diamniotic, unspecified trimester: Secondary | ICD-10-CM

## 2023-12-04 DIAGNOSIS — O0992 Supervision of high risk pregnancy, unspecified, second trimester: Secondary | ICD-10-CM

## 2023-12-04 NOTE — Progress Notes (Signed)
MFM Consult Note  Brandy Brock is currently at 28 weeks and 3 days.  She has been followed due to a dichorionic, diamniotic twin gestation, maternal obesity with a BMI of 59, and chronic hypertension.    She was referred for a fetal echocardiogram with pediatric cardiology.  However, the patient reports that she declined the fetal echocardiogram.  The patient reports that she has stopped taking nifedipine as she did not like the way it made her feel.  Her blood pressure today is 123/75.  She was informed that the fetal growth and amniotic fluid level appears appropriate for her gestational age for both twin A and twin B.  The views of the fetal anatomy remain limited today due to maternal body habitus.  The following were discussed during today's consultation:  Maternal obesity and chronic hypertension  The patient was advised that even though her blood pressure is within normal limits now, there is a high probability that her blood pressures will increase as she enters the third trimester and she may need to be restarted on an antihypertensive medication.    She was encouraged to continue monitoring her blood pressures at home.    Due to the increased risk of superimposed preeclampsia, she was advised to continue taking 2 tablets of baby aspirin (81 mg each) daily for preeclampsia prophylaxis.  We will start weekly fetal testing at 32 weeks.    Due to chronic hypertension and a dichorionic twin gestation, a repeat cesarean delivery should be scheduled at around 37 weeks.    The patient understands the limitations of ultrasound in the detection of all anomalies.  She was advised to have her babies examined after birth to ensure that no congenital defects are present.    She will return in 4 weeks for a growth scan and BPP.  The patient stated that all of her questions were answered today.  A total of 20 minutes was spent counseling and coordinating the care for this patient.   Greater than 50% of the time was spent in direct face-to-face contact.

## 2023-12-07 ENCOUNTER — Encounter: Payer: Self-pay | Admitting: Certified Nurse Midwife

## 2023-12-07 NOTE — Progress Notes (Signed)
PRENATAL VISIT NOTE  Subjective:  Brandy Brock is a 28 y.o. G3P2002 at [redacted]w[redacted]d being seen today for ongoing prenatal care.  She is currently monitored for the following issues for this high-risk pregnancy and has BMI 60.0-69.9, adult (HCC); History of syphilis; Dichorionic diamniotic twin pregnancy; Chronic hypertension; Supervision of high-risk pregnancy; History of pre-eclampsia in prior pregnancy, currently pregnant; History of 2 cesarean sections; Obesity affecting pregnancy, antepartum; and Pain of round ligament affecting pregnancy, antepartum on their problem list.  Patient reports no complaints.  Contractions: Not present. Vag. Bleeding: None.  Movement: Present. Denies leaking of fluid.   The following portions of the patient's history were reviewed and updated as appropriate: allergies, current medications, past family history, past medical history, past social history, past surgical history and problem list.   Objective:   Vitals:   12/10/23 1136  BP: 112/80  Pulse: 94  Weight: (!) 380 lb 1.6 oz (172.4 kg)    Fetal Status: Fetal Heart Rate (bpm): 150/160   Movement: Present     General:  Alert, oriented and cooperative. Patient is in no acute distress.  Skin: Skin is warm and dry. No rash noted.   Cardiovascular: Normal heart rate noted  Respiratory: Normal respiratory effort, no problems with respiration noted  Abdomen: Soft, gravid, appropriate for gestational age.  Pain/Pressure: Present     Pelvic: Cervical exam deferred        Extremities: Normal range of motion.  Edema: None  Mental Status: Normal mood and affect. Normal behavior. Normal judgment and thought content.   Assessment and Plan:  Pregnancy: G3P2002 at [redacted]w[redacted]d 1. Supervision of high risk pregnancy in third trimester (Primary) - Doing well, feeling regular and vigorous fetal movement  - Tdap vaccine greater than or equal to 7yo IM  2. [redacted] weeks gestation of pregnancy - Routine OB care  3. History of  pre-eclampsia in prior pregnancy, currently pregnant - Taking daily aspirin  4. History of 2 cesarean sections - Planning TOLAC w BTL, consent signed 11/26/23 - Pt requests Dr. Vergie Living for this surgery, as he did one of her prior sections. - CS requested for 02/05/24 when Dr. Vergie Living is first attending.  5. Dichorionic diamniotic twin pregnancy in third trimester - Doing well, has follow up with MFM on 2/20  6. Chronic hypertension - Stable on aspirin only  7. Bilateral hip pain - cyclobenzaprine (FLEXERIL) 10 MG tablet; Take 1 tablet (10 mg total) by mouth every 8 (eight) hours as needed for muscle spasms.  Dispense: 30 tablet; Refill: 1 - Magnesium Oxide -Mg Supplement (MAG-OXIDE) 200 MG TABS; Take 2 tablets (400 mg total) by mouth at bedtime. If that amount causes loose stools in the am, switch to 200mg  daily at bedtime.  Dispense: 60 tablet; Refill: 3 - Ambulatory referral to Physical Therapy   Preterm labor symptoms and general obstetric precautions including but not limited to vaginal bleeding, contractions, leaking of fluid and fetal movement were reviewed in detail with the patient. Please refer to After Visit Summary for other counseling recommendations.   Return in about 2 weeks (around 12/24/2023) for IN-PERSON, HOB.  Future Appointments  Date Time Provider Department Center  12/24/2023  3:15 PM Osborne Oman [facility]  01/01/2024  9:15 AM [facility] NURSE [facility]  01/01/2024  9:30 AM [facility] US2 [facility]  01/07/2024 10:55 AM Bernerd Limbo, CNM [facility]  01/08/2024  9:15 AM [facility] NURSE [facility]  01/08/2024  9:30 AM [facility] US4 [facility]  [facility]  01/15/2024  9:15 AM [facility] NURSE [facility]  01/15/2024  9:30 AM [facility] US4 [facility]  01/22/2024  9:15 AM [facility] NURSE [facility]  01/22/2024  9:30 AM [facility] US4 [facility]    Bernerd Limbo, CNM

## 2023-12-10 ENCOUNTER — Ambulatory Visit (INDEPENDENT_AMBULATORY_CARE_PROVIDER_SITE_OTHER): Payer: Medicaid Other | Admitting: Certified Nurse Midwife

## 2023-12-10 ENCOUNTER — Encounter: Payer: Self-pay | Admitting: Certified Nurse Midwife

## 2023-12-10 ENCOUNTER — Other Ambulatory Visit: Payer: Self-pay

## 2023-12-10 VITALS — BP 112/80 | HR 94 | Wt 380.1 lb

## 2023-12-10 DIAGNOSIS — Z23 Encounter for immunization: Secondary | ICD-10-CM | POA: Diagnosis not present

## 2023-12-10 DIAGNOSIS — I10 Essential (primary) hypertension: Secondary | ICD-10-CM

## 2023-12-10 DIAGNOSIS — O0993 Supervision of high risk pregnancy, unspecified, third trimester: Secondary | ICD-10-CM

## 2023-12-10 DIAGNOSIS — Z3A29 29 weeks gestation of pregnancy: Secondary | ICD-10-CM | POA: Diagnosis not present

## 2023-12-10 DIAGNOSIS — O09299 Supervision of pregnancy with other poor reproductive or obstetric history, unspecified trimester: Secondary | ICD-10-CM

## 2023-12-10 DIAGNOSIS — M25552 Pain in left hip: Secondary | ICD-10-CM

## 2023-12-10 DIAGNOSIS — O09293 Supervision of pregnancy with other poor reproductive or obstetric history, third trimester: Secondary | ICD-10-CM | POA: Diagnosis not present

## 2023-12-10 DIAGNOSIS — M25551 Pain in right hip: Secondary | ICD-10-CM

## 2023-12-10 DIAGNOSIS — Z98891 History of uterine scar from previous surgery: Secondary | ICD-10-CM | POA: Diagnosis not present

## 2023-12-10 DIAGNOSIS — O30043 Twin pregnancy, dichorionic/diamniotic, third trimester: Secondary | ICD-10-CM

## 2023-12-10 MED ORDER — CYCLOBENZAPRINE HCL 10 MG PO TABS: 10.0000 mg | ORAL_TABLET | Freq: Three times a day (TID) | ORAL | 1 refills | Status: DC | PRN

## 2023-12-10 MED ORDER — MAG-OXIDE 200 MG PO TABS: 400.0000 mg | ORAL_TABLET | Freq: Every day | ORAL | 3 refills | Status: DC

## 2023-12-11 ENCOUNTER — Ambulatory Visit: Payer: Medicaid Other | Attending: Certified Nurse Midwife | Admitting: Physical Therapy

## 2023-12-11 ENCOUNTER — Encounter: Payer: Self-pay | Admitting: Physical Therapy

## 2023-12-11 DIAGNOSIS — M62838 Other muscle spasm: Secondary | ICD-10-CM | POA: Diagnosis present

## 2023-12-11 DIAGNOSIS — M25552 Pain in left hip: Secondary | ICD-10-CM | POA: Insufficient documentation

## 2023-12-11 DIAGNOSIS — M6281 Muscle weakness (generalized): Secondary | ICD-10-CM | POA: Insufficient documentation

## 2023-12-11 DIAGNOSIS — M25551 Pain in right hip: Secondary | ICD-10-CM | POA: Insufficient documentation

## 2023-12-11 NOTE — Therapy (Addendum)
 OUTPATIENT PHYSICAL THERAPY FEMALE PELVIC EVALUATION/ later day discharge   Patient Name: Brandy Brock MRN: [id number] DOB:11-Jul-1996, 28 y.o., female Today's Date: 12/11/2023  END OF SESSION:  PT End of Session - 12/11/23 1801     Visit Number 1    Authorization Type Medicaid- [place]- waiting on auth    Authorization Time Period 03/04/2024    PT Start Time 1145    PT Stop Time 1230    PT Time Calculation (min) 45 min    Activity Tolerance Patient tolerated treatment well    Behavior During Therapy [facility] for tasks assessed/performed             Past Medical History:  Diagnosis Date   Asthma    childhood   Asthma 10/13/2008   Qualifier: Diagnosis of   By: Prentiss ROSALEA Frieze   Have albuterol  but has not used in pregnancy           Chlamydia    Chronic hypertension affecting pregnancy    Reports history of HTN but is not on medication at this time  10-02-20 157/89 and 143/89 at [facility] department     [facility] Guidelines for Antenatal Testing and Sonography                                                         Updated  01/29/2021 with Dr. Fredia Fresh           INDICATION    Growth U/S    BPP weekly    DELIVERY RECOMMENDATION (GA)                            CHTN    Group I   BP < 14   Contraception management 10/28/2022   Elevated liver function tests 12/11/2020   Elevated in MAU on 1/7 - repeat beginning of February    History of postpartum dehiscence of wound 04/22/2022   Hypertension    LGSIL of cervix of undetermined significance    Nausea/vomiting in pregnancy 10/24/2023   Postpartum care following cesarean delivery 10/28/2022   Pregnancy induced hypertension    Status post repeat low transverse cesarean section 04/30/2021   03/2021 LTCS: arrest of dilation 7cm   Syphilis affecting pregnancy    Past Surgical History:  Procedure Laterality Date   CESAREAN SECTION N/A 03/17/2021   Procedure: CESAREAN SECTION;  Surgeon: Izell Harari, MD;  Location: [facility] LD ORS;   Service: Obstetrics;  Laterality: N/A;   CESAREAN SECTION N/A 09/15/2022   Procedure: CESAREAN SECTION;  Surgeon: Izell Harari, MD;  Location: [facility] LD ORS;  Service: Obstetrics;  Laterality: N/A;   MULTIPLE EXTRACTIONS WITH ALVEOLOPLASTY N/A 10/11/2016   Procedure: MULTIPLE EXTRACTION WITH ALVEOLOPLASTY;  Surgeon: Glendia Primrose, DDS;  Location: [facility] OR;  Service: Oral Surgery;  Laterality: N/A;   Patient Active Problem List   Diagnosis Date Noted   Pain of round ligament affecting pregnancy, antepartum 10/24/2023   History of pre-eclampsia in prior pregnancy, currently pregnant 09/10/2023   History of 2 cesarean sections 09/10/2023   Obesity affecting pregnancy, antepartum 09/10/2023   Supervision of high-risk pregnancy 07/29/2023   Dichorionic diamniotic twin pregnancy 07/01/2023   Chronic hypertension 07/01/2023   History of syphilis 04/22/2022   BMI 60.0-69.9, adult (HCC) 10/13/2008  PCP: Patient, No Pcp Per REFERRING PROVIDER: Vannie Cornell SAUNDERS, CNM REFERRING DIAG: [phone] (ICD-10-CM) - Bilateral hip pain   THERAPY DIAG:  Muscle weakness (generalized)  Other muscle spasm  Rationale for Evaluation and Treatment: Rehabilitation  ONSET DATE: 08/2023  SUBJECTIVE:                                                                                                                                                                                    SUBJECTIVE STATEMENT: Pt is 29 weeks and 3 days pregnant. She reports that she is having hip pain and low back pain.  Sometimes in unbearable, has to give herself a second, takes tylenol , tries to take not too much.  Had 2 previous C sections.  Oldest 28 yo and youngest is 1.  Has help Never had PT before  Fluid intake: Yes:     PAIN:  Are you having pain? Yes NPRS scale: 3/10 Pain location: glutes. Lumbar spine area- no shooting pain  Pain type: aching Pain description: constant   Aggravating factors: position, lying down  too long, sitting too long, walking,random, worst at night when she goes to the bathroom Relieving factors: stretches to the side, moving  PRECAUTIONS: None  RED FLAGS: None   WEIGHT BEARING RESTRICTIONS: No  FALLS:  Has patient fallen in last 6 months? No  LIVING ENVIRONMENT: Lives with: lives with their family and lives with their sons              Lives in: House/apartment Stairs: No Has following equipment at home: None  OCCUPATION: Diplomatic Services operational officer at [employer], lot of walking.   PLOF: Independent  PATIENT GOALS: try to figure out what she can do at home too  PERTINENT HISTORY:  2 C sections Sexual abuse: No  BOWEL MOVEMENT: no issues URINATION: no issues  INTERCOURSE: no issues PREGNANCY: Vaginal deliveries 0 Tearing No C-section deliveries 2 Currently pregnant Yes: twins  PROLAPSE: None   OBJECTIVE:  Note: Objective measures were completed at Evaluation unless otherwise noted.  COGNITION: Overall cognitive status: Within functional limits for tasks assessed     SENSATION: Light touch: Appears intact Proprioception: Appears intact  MUSCLE LENGTH: Hamstrings: Right 80 deg; Left 80 deg T  LUMBAR SPECIAL TESTS:  Straight leg raise test: Negative    GAIT: Distance walked: 30 Assistive device utilized: None Level of assistance: Complete Independence Comments: slow, antalgic without support belt, imporved with support belt  POSTURE: rounded shoulders, forward head, and anterior pelvic tilt  PELVIC ALIGNMENT: even   LUMBARAROM/PROM: difficulty with bending, extending, restricted in all planes due to pregnancy  LOWER EXTREMITY ROM: grossly within functional limitations    LOWER  EXTREMITY MMT: at least 4- to 4/5 overall  PALPATION:   General  within functional limitations                 External Perineal Exam to be assessed if needed                             Internal Pelvic Floor to be assessed if needed  Patient confirms  identification and approves PT to assess internal pelvic floor and treatment No  PELVIC MMT:   MMT eval  Vaginal   Internal Anal Sphincter   External Anal Sphincter   Puborectalis   Diastasis Recti   (Blank rows = not tested)        TONE: Low tone in abdomen, increased adipose tissue throughout  PROLAPSE: To be assessed if needed  TODAY'S TREATMENT:                                                                                                                              DATE: 12/11/23   EVAL see below  There acts- trial of serola belt for stabilization There acts- shoulder extension with transverse abdominis breath  PATIENT EDUCATION:  Education details: relevant anatomy, HEP, expectations of PT, serola belt, aquatherapy Person educated: Patient Education method: Explanation, Demonstration, and Handouts Education comprehension: verbalized understanding and needs further education  HOME EXERCISE PROGRAM: [id number]  ASSESSMENT:  CLINICAL IMPRESSION: Patient is a 28 y.o. F who was seen today for physical therapy evaluation and treatment for hip and back pain in pregnancy. Pt with weakness throughout, did well with external stabilization with serola belt and sheet and core exercises. She will benefit from PT to reduce low back pain and hip pain and improve core strength before delivery.    OBJECTIVE IMPAIRMENTS: Abnormal gait, decreased endurance, difficulty walking, decreased ROM, decreased strength, obesity, and pain.   ACTIVITY LIMITATIONS: carrying, lifting, bending, sitting, standing, squatting, sleeping, and transfers  PARTICIPATION LIMITATIONS: community activity and occupation  PERSONAL FACTORS: Time since onset of injury/illness/exacerbation are also affecting patient's functional outcome.   REHAB POTENTIAL: Good  CLINICAL DECISION MAKING: Stable/uncomplicated  EVALUATION COMPLEXITY: Low   GOALS: Goals reviewed with patient? Yes  SHORT TERM GOALS: Target  date: 01/08/2024    Pt will be I with initial HEP Baseline: Goal status: INITIAL  2.  Pt will be I with use of pregnancy support belt Baseline:  Goal status: INITIAL  3.  Pt will dem all exercises correctly Baseline:  Goal status: INITIAL   LONG TERM GOALS: Target date: 03/04/2024    Pt will dem improved bilateral hip strength to 5/5 Baseline:  Goal status: INITIAL  2.  Pt will report reduced LBP to max 3/10 Baseline:  Goal status: INITIAL  3.  Pt will report reduced hip pain to max 2/10 Baseline:  Goal status: INITIAL  4.  Pt will be I advanced HEP Baseline:  Goal  status: INITIAL  PLAN:  PT FREQUENCY: 1-2x/week  PT DURATION: 12 weeks  PLANNED INTERVENTIONS: 97110-Therapeutic exercises, 97530- Therapeutic activity, 97112- Neuromuscular re-education, 97535- Self Care, [id number]- Manual therapy, [phone]- Aquatic Therapy, [phone]- Electrical stimulation (manual), Taping, Dry Needling, Joint mobilization, Joint manipulation, Spinal manipulation, Spinal mobilization, and Biofeedback  PLAN FOR NEXT SESSION: cont strengthening   Tiwanna Tuch, PT 12/11/23 6:14 PM    PHYSICAL THERAPY DISCHARGE SUMMARY  Visits from Start of Care: 3   Patient agrees to discharge. Patient goals were partially met. Patient is being discharged due to not returning since the last visit. ( Pt had 2 aqua therapy sessions after IE)  Garnett Rekowski, PT 05/10/24 3:22 PM

## 2023-12-18 MED ORDER — ALBUTEROL SULFATE HFA 108 (90 BASE) MCG/ACT IN AERS: 2.0000 | INHALATION_SPRAY | Freq: Four times a day (QID) | RESPIRATORY_TRACT | 2 refills | Status: AC | PRN

## 2023-12-24 ENCOUNTER — Other Ambulatory Visit: Payer: Self-pay

## 2023-12-24 ENCOUNTER — Ambulatory Visit (INDEPENDENT_AMBULATORY_CARE_PROVIDER_SITE_OTHER): Payer: Medicaid Other | Admitting: Certified Nurse Midwife

## 2023-12-24 ENCOUNTER — Ambulatory Visit: Payer: Medicaid Other | Admitting: Physical Therapy

## 2023-12-24 VITALS — BP 133/83 | HR 98 | Wt 375.0 lb

## 2023-12-24 DIAGNOSIS — Z98891 History of uterine scar from previous surgery: Secondary | ICD-10-CM | POA: Diagnosis not present

## 2023-12-24 DIAGNOSIS — O30043 Twin pregnancy, dichorionic/diamniotic, third trimester: Secondary | ICD-10-CM | POA: Diagnosis not present

## 2023-12-24 DIAGNOSIS — O0993 Supervision of high risk pregnancy, unspecified, third trimester: Secondary | ICD-10-CM

## 2023-12-24 DIAGNOSIS — Z3A31 31 weeks gestation of pregnancy: Secondary | ICD-10-CM | POA: Diagnosis not present

## 2023-12-24 DIAGNOSIS — I10 Essential (primary) hypertension: Secondary | ICD-10-CM

## 2023-12-24 NOTE — Progress Notes (Signed)
   PRENATAL VISIT NOTE  Subjective:  Brandy Brock is a 28 y.o. G3P2002 at [redacted]w[redacted]d being seen today for ongoing prenatal care.  She is currently monitored for the following issues for this high-risk pregnancy and has BMI 60.0-69.9, adult (HCC); History of syphilis; Dichorionic diamniotic twin pregnancy; Chronic hypertension; Supervision of high-risk pregnancy; History of pre-eclampsia in prior pregnancy, currently pregnant; History of 2 cesarean sections; Obesity affecting pregnancy, antepartum; and Pain of round ligament affecting pregnancy, antepartum on their problem list.  Patient reports no complaints.  Contractions: Not present. Vag. Bleeding: None.  Movement: Present. Denies leaking of fluid.   The following portions of the patient's history were reviewed and updated as appropriate: allergies, current medications, past family history, past medical history, past social history, past surgical history and problem list.   Objective:   Vitals:   12/24/23 1517 12/24/23 1535  BP: (!) 147/68 133/83  Pulse: (!) 101 98  Weight: (!) 375 lb (170.1 kg)     Fetal Status: Fetal Heart Rate (bpm): 144/147   Movement: Present     General:  Alert, oriented and cooperative. Patient is in no acute distress.  Skin: Skin is warm and dry. No rash noted.   Cardiovascular: Normal heart rate noted  Respiratory: Normal respiratory effort, no problems with respiration noted  Abdomen: Soft, gravid, appropriate for gestational age.  Pain/Pressure: Present     Pelvic: Cervical exam deferred        Extremities: Normal range of motion.  Edema: None  Mental Status: Normal mood and affect. Normal behavior. Normal judgment and thought content.   Assessment and Plan:  Pregnancy: G3P2002 at [redacted]w[redacted]d 1. Supervision of high risk pregnancy in third trimester - Doing well, feeling regular and vigorous fetal movement   2. [redacted] weeks gestation of pregnancy (Primary) - Routine OB care   3. Dichorionic diamniotic twin  pregnancy in third trimester - Begin followed by MFM  4. History of 2 cesarean sections - Has rCS scheduled for 37 weeks  5. Chronic hypertension - Elevated on initial reading but normal recheck, if elevated again next week will get labs - Asymptomatic for preeclampsia  Preterm labor symptoms and general obstetric precautions including but not limited to vaginal bleeding, contractions, leaking of fluid and fetal movement were reviewed in detail with the patient. Please refer to After Visit Summary for other counseling recommendations.   Return in about 2 weeks (around 01/07/2024) for IN-PERSON, HOB.  Future Appointments  Date Time Provider Department Center  12/31/2023  2:45 PM Jeanmarie Hubert, PT [facility]  01/01/2024  9:15 AM [facility] NURSE [facility]  01/01/2024  9:30 AM [facility] US2 [facility]  01/05/2024  9:30 AM Jeanmarie Hubert, PT [facility]  01/07/2024 10:55 AM Bernerd Limbo, CNM [facility]  01/08/2024  9:15 AM [facility] NURSE [facility]  01/08/2024  9:30 AM [facility] US4 [facility]  01/15/2024  9:15 AM [facility] NURSE [facility]  01/15/2024  9:30 AM [facility] US4 [facility]  01/22/2024  9:15 AM [facility] NURSE [facility]  01/22/2024  9:30 AM [facility] US4 [facility]   Bernerd Limbo, CNM

## 2023-12-28 ENCOUNTER — Inpatient Hospital Stay (HOSPITAL_COMMUNITY)
Admission: AD | Admit: 2023-12-28 | Discharge: 2023-12-29 | Disposition: A | Discharge status: Home / Self Care | Payer: Medicaid Other | Attending: Obstetrics & Gynecology | Admitting: Obstetrics & Gynecology

## 2023-12-28 ENCOUNTER — Encounter (HOSPITAL_COMMUNITY): Payer: Self-pay | Admitting: Obstetrics & Gynecology

## 2023-12-28 DIAGNOSIS — R102 Pelvic and perineal pain: Secondary | ICD-10-CM | POA: Diagnosis not present

## 2023-12-28 DIAGNOSIS — M549 Dorsalgia, unspecified: Secondary | ICD-10-CM | POA: Insufficient documentation

## 2023-12-28 DIAGNOSIS — O10013 Pre-existing essential hypertension complicating pregnancy, third trimester: Secondary | ICD-10-CM | POA: Insufficient documentation

## 2023-12-28 DIAGNOSIS — O30043 Twin pregnancy, dichorionic/diamniotic, third trimester: Secondary | ICD-10-CM | POA: Insufficient documentation

## 2023-12-28 DIAGNOSIS — R112 Nausea with vomiting, unspecified: Secondary | ICD-10-CM | POA: Diagnosis present

## 2023-12-28 DIAGNOSIS — R1110 Vomiting, unspecified: Secondary | ICD-10-CM

## 2023-12-28 DIAGNOSIS — Z3A32 32 weeks gestation of pregnancy: Secondary | ICD-10-CM | POA: Diagnosis not present

## 2023-12-28 DIAGNOSIS — O210 Mild hyperemesis gravidarum: Secondary | ICD-10-CM | POA: Insufficient documentation

## 2023-12-28 DIAGNOSIS — O26893 Other specified pregnancy related conditions, third trimester: Secondary | ICD-10-CM | POA: Insufficient documentation

## 2023-12-28 DIAGNOSIS — O0993 Supervision of high risk pregnancy, unspecified, third trimester: Secondary | ICD-10-CM | POA: Insufficient documentation

## 2023-12-28 DIAGNOSIS — O26899 Other specified pregnancy related conditions, unspecified trimester: Secondary | ICD-10-CM

## 2023-12-28 DIAGNOSIS — Z91148 Patient's other noncompliance with medication regimen for other reason: Secondary | ICD-10-CM | POA: Insufficient documentation

## 2023-12-28 DIAGNOSIS — Z6841 Body Mass Index (BMI) 40.0 and over, adult: Secondary | ICD-10-CM

## 2023-12-28 DIAGNOSIS — I10 Essential (primary) hypertension: Secondary | ICD-10-CM

## 2023-12-28 LAB — CBC
HCT: 29.7 % — ABNORMAL LOW (ref 36.0–46.0)
Hemoglobin: 9.9 g/dL — ABNORMAL LOW (ref 12.0–15.0)
MCH: 29.2 pg (ref 26.0–34.0)
MCHC: 33.3 g/dL (ref 30.0–36.0)
MCV: 87.6 fL (ref 80.0–100.0)
Platelets: 172 10*3/uL (ref 150–400)
RBC: 3.39 MIL/uL — ABNORMAL LOW (ref 3.87–5.11)
RDW: 14.5 % (ref 11.5–15.5)
WBC: 11.2 10*3/uL — ABNORMAL HIGH (ref 4.0–10.5)
nRBC: 0 % (ref 0.0–0.2)

## 2023-12-28 LAB — COMPREHENSIVE METABOLIC PANEL
ALT: 16 U/L (ref 0–44)
AST: 21 U/L (ref 15–41)
Albumin: 2.7 g/dL — ABNORMAL LOW (ref 3.5–5.0)
Alkaline Phosphatase: 47 U/L (ref 38–126)
Anion gap: 10 (ref 5–15)
BUN: <5 mg/dL — ABNORMAL LOW (ref 6–20)
CO2: 21 mmol/L — ABNORMAL LOW (ref 22–32)
Calcium: 9 mg/dL (ref 8.9–10.3)
Chloride: 104 mmol/L (ref 98–111)
Creatinine, Ser: 0.54 mg/dL (ref 0.44–1.00)
GFR, Estimated: >60 mL/min (ref >60)
Glucose, Bld: 78 mg/dL (ref 70–99)
Potassium: 3.5 mmol/L (ref 3.5–5.1)
Sodium: 135 mmol/L (ref 135–145)
Total Bilirubin: 0.7 mg/dL (ref 0.0–1.2)
Total Protein: 6.7 g/dL (ref 6.5–8.1)

## 2023-12-28 MED ORDER — NIFEDIPINE 10 MG PO CAPS: 10.0000 mg | ORAL_CAPSULE | ORAL | Status: DC | PRN

## 2023-12-28 MED ORDER — CYCLOBENZAPRINE HCL 5 MG PO TABS
10.0000 mg | ORAL_TABLET | Freq: Once | ORAL | Status: AC
  Administered 2023-12-28: 10 mg via ORAL
  Filled 2023-12-28: qty 2

## 2023-12-28 MED ORDER — LABETALOL HCL 5 MG/ML IV SOLN: 40.0000 mg | INTRAVENOUS | Status: DC | PRN

## 2023-12-28 MED ORDER — ACETAMINOPHEN 500 MG PO TABS
1000.0000 mg | ORAL_TABLET | Freq: Once | ORAL | Status: AC
  Administered 2023-12-28: 1000 mg via ORAL
  Filled 2023-12-28: qty 2

## 2023-12-28 MED ORDER — SODIUM CHLORIDE 0.9 % IV SOLN
8.0000 mg | Freq: Once | INTRAVENOUS | Status: DC
  Filled 2023-12-28: qty 4

## 2023-12-28 MED ORDER — NIFEDIPINE 10 MG PO CAPS
20.0000 mg | ORAL_CAPSULE | ORAL | Status: DC | PRN
Start: 2023-12-28 — End: 2023-12-29

## 2023-12-28 MED ORDER — ONDANSETRON 4 MG PO TBDP
8.0000 mg | ORAL_TABLET | Freq: Once | ORAL | Status: AC
  Administered 2023-12-28: 8 mg via ORAL
  Filled 2023-12-28: qty 2

## 2023-12-28 NOTE — MAU Provider Note (Signed)
Chief Complaint:  Emesis and Nausea   HPI      Brandy Brock is a 28 y.o. G3P2002 at [redacted]w[redacted]d who presents to maternity admissions reporting for the past 3 days she has been c/o nausea with mild abdominal pain and having difficulty eating and drinking. Denies any VB, LOF and feels fetal movements x 2. She denies any HA's, visual changes, RUQ pain at this time  Patients pregnancy is complicated by the following, cHTN ( Non compliant with meds) She reports " makes me feel sick and gives me HA's" hasn't taken for the past month was prescribed Procardia 30XL and ASA 162 mg daily , Louis Matte, Morbid Obesity, H/O 2 prior C- Sections for repeat C- Section with BTL. Patient followed by MFM   Pregnancy Course: [facility]  Past Medical History:  Diagnosis Date   Asthma    childhood   Asthma 10/13/2008   Qualifier: Diagnosis of   By: Quincy Carnes   Have albuterol but has not used in pregnancy           Chlamydia    Chronic hypertension affecting pregnancy    Reports history of HTN but is not on medication at this time  10-02-20 157/89 and 143/89 at [facility] department     [facility]/MFM Guidelines for Antenatal Testing and Sonography                                                         Updated  01/29/2021 with Dr. Noralee Space           INDICATION    Growth U/S    BPP weekly    DELIVERY RECOMMENDATION (GA)                            CHTN    Group I   BP < 14   Contraception management 10/28/2022   Elevated liver function tests 12/11/2020   Elevated in MAU on 1/7 - repeat beginning of February    History of postpartum dehiscence of wound 04/22/2022   Hypertension    LGSIL of cervix of undetermined significance    Nausea/vomiting in pregnancy 10/24/2023   Postpartum care following cesarean delivery 10/28/2022   Pregnancy induced hypertension    Status post repeat low transverse cesarean section 04/30/2021   03/2021 LTCS: arrest of dilation 7cm   Syphilis affecting pregnancy    OB History  Gravida Para  Term Preterm AB Living  3 2 2  0 0 2  SAB IAB Ectopic Multiple Live Births  0 0 0 0 2    # Outcome Date GA Lbr Len/2nd Weight Sex Type Anes PTL Lv  3 Current           2 Term 09/15/22 [redacted]w[redacted]d  3150 g M CS-LTranv Spinal  LIV  1 Term 03/17/21 [redacted]w[redacted]d  2634 g M CS-LTranv Spinal  LIV     Birth Comments: WNL   Past Surgical History:  Procedure Laterality Date   CESAREAN SECTION N/A 03/17/2021   Procedure: CESAREAN SECTION;  Surgeon: [place] Bing, MD;  [facility]: [facility] LD [facility];  Service: Obstetrics;  Laterality: N/A;   CESAREAN SECTION N/A 09/15/2022   Procedure: CESAREAN SECTION;  Surgeon: [place] Bing, MD;  Location: [facility] LD ORS;  Service: Obstetrics;  Laterality: N/A;   MULTIPLE EXTRACTIONS WITH ALVEOLOPLASTY N/A 10/11/2016   Procedure: MULTIPLE EXTRACTION WITH ALVEOLOPLASTY;  Surgeon: Ocie Doyne, DDS;  Location: [facility] OR;  Service: Oral Surgery;  Laterality: N/A;   Family History  Problem Relation Age of Onset   Healthy Mother    Diabetes Father    Diabetes Brother    Social History   Tobacco Use   Smoking status: Never   Smokeless tobacco: Former  Building services engineer status: Never Used  Substance Use Topics   Alcohol use: Not Currently   Drug use: No   Allergies  Allergen Reactions   Peanut-Containing Drug Products Itching and Swelling    Facial itching and swelling.   Medications Prior to Admission  Medication Sig Dispense Refill Last Dose/Taking   albuterol (VENTOLIN HFA) 108 (90 Base) MCG/ACT inhaler Inhale 2 puffs into the lungs every 6 (six) hours as needed for wheezing or shortness of breath. 8 g 2 12/27/2023   aspirin EC 81 MG tablet Take 2 tablets (162 mg total) by mouth daily. Swallow whole. 30 tablet 12 12/28/2023   Magnesium Oxide -Mg Supplement (MAG-OXIDE) 200 MG TABS Take 2 tablets (400 mg total) by mouth at bedtime. If that amount causes loose stools in the am, switch to 200mg  daily at bedtime. 60 tablet 3 12/27/2023   Prenatal Vit-Fe Fumarate-FA (PREPLUS) 27-1 MG TABS  Take 1 tablet by mouth daily. 30 tablet 11 12/28/2023   Blood Pressure Monitoring (BLOOD PRESSURE KIT) DEVI 1 each by Does not apply route as needed. 1 each 0    cyclobenzaprine (FLEXERIL) 10 MG tablet Take 1 tablet (10 mg total) by mouth every 8 (eight) hours as needed for muscle spasms. 30 tablet 1    NIFEdipine (PROCARDIA XL) 30 MG 24 hr tablet Take 1 tablet (30 mg total) by mouth daily. (Patient not taking: Reported on 11/26/2023) 30 tablet 11 More than a month    I have reviewed patient's Past Medical Hx, Surgical Hx, Family Hx, Social Hx, medications and allergies.   ROS  Pertinent items noted in HPI and remainder of comprehensive ROS otherwise negative.   PHYSICAL EXAM  Patient Vitals for the past 24 hrs:  BP Temp Pulse Resp SpO2 Height  12/29/23 0030 (!) 149/92 -- 87 -- -- --  12/29/23 0015 (!) 149/85 -- 85 -- -- --  12/28/23 2316 (!) 140/70 -- 89 -- -- --  12/28/23 2315 -- -- -- -- 99 % --  12/28/23 2310 -- -- -- -- 98 % --  12/28/23 2305 -- -- -- -- 98 % --  12/28/23 2301 (!) 158/84 -- 84 -- -- --  12/28/23 2224 (!) 141/82 -- 90 -- -- --  12/28/23 2155 (!) 157/88 98.1 F (36.7 C) 88 18 -- 5\' 7"  (1.702 m)    Constitutional: Well-developed, morbidly Obese female in  no acute distress.  Cardiovascular: normal rate & rhythm, BP's are elevated MRBP's  Respiratory: normal effort, no problems with respiration noted while patient is sitting up  GI: Abd soft, non-tender, gravid ( limited exam d/t patients increased body habitus and large panus) MS: Extremities nontender, no edema, normal ROM Neurologic: Alert and oriented x 4.  GU: no CVA tenderness Pelvic: NEFG, physiologic discharge, no blood, cervix clean.      Fetal Tracing:  Baby A Cat 1 reactive  Baseline: 140 Variability: moderate  Accelerations: present Decelerations: absent Toco: no CTX  Baby B : Cat 1 reactive Baseline: 150 Variability : Moderate  Accelerations: Present  Decelerations: absent   Ultrasound  needed for location and presentation  of both fetuses to obtain [facility] - RN's unable to locate [facility] therefore ultrasound was required at the bedside   Pt informed that the ultrasound is considered a limited OB ultrasound and is not intended to be a complete ultrasound exam.  Patient also informed that the ultrasound is not being completed with the intent of assessing for fetal or placental anomalies or any pelvic abnormalities.  Explained that the purpose of today's ultrasound is to assess for  presentation.  Patient acknowledges the purpose of the exam and the limitations of the study.    Labs: Results for orders placed or performed during the hospital encounter of 12/28/23 (from the past 24 hours)  CBC     Status: Abnormal   Collection Time: 12/28/23 11:00 PM  Result Value Ref Range   WBC 11.2 (H) 4.0 - 10.5 K/uL   RBC 3.39 (L) 3.87 - 5.11 MIL/uL   Hemoglobin 9.9 (L) 12.0 - 15.0 g/dL   HCT 82.9 (L) 56.2 - 13.0 %   MCV 87.6 80.0 - 100.0 fL   MCH 29.2 26.0 - 34.0 pg   MCHC 33.3 30.0 - 36.0 g/dL   RDW 86.5 78.4 - 69.6 %   Platelets 172 150 - 400 K/uL   nRBC 0.0 0.0 - 0.2 %  Comprehensive metabolic panel     Status: Abnormal   Collection Time: 12/28/23 11:00 PM  Result Value Ref Range   Sodium 135 135 - 145 mmol/L   Potassium 3.5 3.5 - 5.1 mmol/L   Chloride 104 98 - 111 mmol/L   CO2 21 (L) 22 - 32 mmol/L   Glucose, Bld 78 70 - 99 mg/dL   BUN <5 (L) 6 - 20 mg/dL   Creatinine, Ser 2.95 0.44 - 1.00 mg/dL   Calcium 9.0 8.9 - 28.4 mg/dL   Total Protein 6.7 6.5 - 8.1 g/dL   Albumin 2.7 (L) 3.5 - 5.0 g/dL   AST 21 15 - 41 U/L   ALT 16 0 - 44 U/L   Alkaline Phosphatase 47 38 - 126 U/L   Total Bilirubin 0.7 0.0 - 1.2 mg/dL   GFR, Estimated >13 >24 mL/min   Anion gap 10 5 - 15  Urinalysis, Routine w reflex microscopic -Urine, Clean Catch     Status: Abnormal   Collection Time: 12/28/23 11:40 PM  Result Value Ref Range   Color, Urine AMBER (A) YELLOW   APPearance HAZY (A) CLEAR    Specific Gravity, Urine 1.018 1.005 - 1.030   pH 6.0 5.0 - 8.0   Glucose, UA NEGATIVE NEGATIVE mg/dL   Hgb urine dipstick NEGATIVE NEGATIVE   Bilirubin Urine NEGATIVE NEGATIVE   Ketones, ur 5 (A) NEGATIVE mg/dL   Protein, ur 30 (A) NEGATIVE mg/dL   Nitrite NEGATIVE NEGATIVE   Leukocytes,Ua TRACE (A) NEGATIVE   RBC / HPF 0-5 0 - 5 RBC/hpf   WBC, UA 0-5 0 - 5 WBC/hpf   Bacteria, UA RARE (A) NONE SEEN   Squamous Epithelial / HPF 11-20 0 - 5 /HPF   Mucus PRESENT   Protein / creatinine ratio, urine     Status: None   Collection Time: 12/28/23 11:40 PM  Result Value Ref Range   Creatinine, Urine 219 mg/dL   Total Protein, Urine 31 mg/dL   Protein Creatinine Ratio 0.14 0.00 - 0.15 mg/mg[Cre]    Imaging: performed above   MDM & MAU COURSE  MDM:  HIGH  Meds as stated below NST Ultrasound at the bedside PreE workup and protocol in place  BP monitoring Change antihypertensive medications after discussion with Dr Charlotta Newton [facility] Attending)  CHANGE : DC Procardia to Labetalol 300 mg BID and will titrate up as needed. BP check in 3 days. Message  sent to the office to schedule appointment and contact patient through my Chart with RN Nurse visit    I have reviewed the patient chart and performed the physical exam . I have ordered & interpreted the lab results and reviewed and interpreted the NST Medications ordered as stated below.  A/P as described below.  Counseling and education provided and patient agreeable  with plan as described below. Verbalized understanding.    MAU Course: Orders Placed This Encounter  Procedures   Urinalysis, Routine w reflex microscopic -Urine, Clean Catch   CBC   Comprehensive metabolic panel   Protein / creatinine ratio, urine   Notify physician (specify) Confirmatory reading of BP> 160/110 15 minutes later   Apply Hypertensive Disorders of Pregnancy Care Plan   Measure blood pressure   Saline lock IV   Meds ordered this encounter  Medications   AND  Linked Order Group    NIFEdipine (PROCARDIA) capsule 10 mg    NIFEdipine (PROCARDIA) capsule 20 mg    NIFEdipine (PROCARDIA) capsule 20 mg    labetalol (NORMODYNE) injection 40 mg   DISCONTD: ondansetron (ZOFRAN) 8 mg in sodium chloride 0.9 % 50 mL IVPB   ondansetron (ZOFRAN-ODT) disintegrating tablet 8 mg   cyclobenzaprine (FLEXERIL) tablet 10 mg   acetaminophen (TYLENOL) tablet 1,000 mg   ondansetron (ZOFRAN-ODT) 4 MG disintegrating tablet    Sig: Take 2 tablets (8 mg total) by mouth 2 (two) times daily as needed for nausea or vomiting.    Dispense:  20 tablet    Refill:  0    Supervising Provider:   Reva Bores [[id number]]   labetalol (NORMODYNE) 100 MG tablet    Sig: Take 3 tablets (300 mg total) by mouth 2 (two) times daily.    Dispense:  180 tablet    Refill:  1    Supervising Provider:   Reva Bores [[id number]]   cyclobenzaprine (FLEXERIL) 10 MG tablet    Sig: Take 1 tablet (10 mg total) by mouth 3 (three) times daily as needed for muscle spasms.    Dispense:  20 tablet    Refill:  0    Supervising Provider:   Reva Bores [[id number]]    ASSESSMENT   1. Dichorionic diamniotic twin pregnancy in third trimester   2. Supervision of high risk pregnancy in third trimester   3. Pain of round ligament affecting pregnancy, antepartum   4. Chronic hypertension   5. [redacted] weeks gestation of pregnancy   6. Hyperemesis   7. Back pain affecting pregnancy in third trimester   8. Non compliance w medication regimen   9. BMI 50.0-59.9, adult [facility])     PLAN  Discharge home in stable condition with strict  return precautions.  BP check up in 3 days  See AVS for detailed counseling and education provided     Follow-up Information     [facility] for [facility] Follow up.   Specialty: Obstetrics and Gynecology Why: Blood pressure check on 12/31/23 Contact information: [address] [place] [address] [phone]                 Allergies as  of 12/29/2023       Reactions   Peanut-containing Drug Products Itching, Swelling   Facial itching and swelling.        Medication List     STOP taking these medications    NIFEdipine 30 MG 24 hr tablet Commonly known as: Procardia XL       TAKE these medications    albuterol 108 (90 Base) MCG/ACT inhaler Commonly known as: VENTOLIN HFA Inhale 2 puffs into the lungs every 6 (six) hours as needed for wheezing or shortness of breath.   aspirin EC 81 MG tablet Take 2 tablets (162 mg total) by mouth daily. Swallow whole.   Blood Pressure Kit Devi 1 each by Does not apply route as needed.   cyclobenzaprine 10 MG tablet Commonly known as: FLEXERIL Take 1 tablet (10 mg total) by mouth every 8 (eight) hours as needed for muscle spasms. What changed: Another medication with the same name was added. Make sure you understand how and when to take each.   cyclobenzaprine 10 MG tablet Commonly known as: FLEXERIL Take 1 tablet (10 mg total) by mouth 3 (three) times daily as needed for muscle spasms. What changed: You were already taking a medication with the same name, and this prescription was added. Make sure you understand how and when to take each.   labetalol 100 MG tablet Commonly known as: NORMODYNE Take 3 tablets (300 mg total) by mouth 2 (two) times daily.   Mag-Oxide 200 MG Tabs Generic drug: Magnesium Oxide -Mg Supplement Take 2 tablets (400 mg total) by mouth at bedtime. If that amount causes loose stools in the am, switch to 200mg  daily at bedtime.   ondansetron 4 MG disintegrating tablet Commonly known as: ZOFRAN-ODT Take 2 tablets (8 mg total) by mouth 2 (two) times daily as needed for nausea or vomiting.   PrePLUS 27-1 MG Tabs Take 1 tablet by mouth daily.        Marcell Barlow, MSN, [facility], Center for [employer]

## 2023-12-28 NOTE — MAU Note (Signed)
Marcell Barlow NP in to assist with location of FHR for external cardio.  Twin A  RLQ.vertex. Twin B UMQ transverse

## 2023-12-28 NOTE — MAU Provider Note (Incomplete)
Chief Complaint:  Emesis and Nausea   HPI   None     Brandy Brock is a 28 y.o. G3P2002 at [redacted]w[redacted]d who presents to maternity admissions reporting ***.   Pregnancy Course: ***  Past Medical History:  Diagnosis Date  . Asthma    childhood  . Asthma 10/13/2008   Qualifier: Diagnosis of   By: Quincy Carnes   Have albuterol but has not used in pregnancy          . Chlamydia   . Chronic hypertension affecting pregnancy    Reports history of HTN but is not on medication at this time  10-02-20 157/89 and 143/89 at [facility] department     [facility]/MFM Guidelines for Antenatal Testing and Sonography                                                         Updated  01/29/2021 with Dr. Noralee Space           INDICATION    Growth U/S    BPP weekly    DELIVERY RECOMMENDATION (GA)                            CHTN    Group I   BP < 14  . Contraception management 10/28/2022  . Elevated liver function tests 12/11/2020   Elevated in MAU on 1/7 - repeat beginning of February   . History of postpartum dehiscence of wound 04/22/2022  . Hypertension   . LGSIL of cervix of undetermined significance   . Nausea/vomiting in pregnancy 10/24/2023  . Postpartum care following cesarean delivery 10/28/2022  . Pregnancy induced hypertension   . Status post repeat low transverse cesarean section 04/30/2021   03/2021 LTCS: arrest of dilation 7cm  . Syphilis affecting pregnancy    OB History  Gravida Para Term Preterm AB Living  3 2 2  0 0 2  SAB IAB Ectopic Multiple Live Births  0 0 0 0 2    # Outcome Date GA Lbr Len/2nd Weight Sex Type Anes PTL Lv  3 Current           2 Term 09/15/22 [redacted]w[redacted]d  3150 g M CS-LTranv Spinal  LIV  1 Term 03/17/21 [redacted]w[redacted]d  2634 g M CS-LTranv Spinal  LIV     Birth Comments: WNL   Past Surgical History:  Procedure Laterality Date  . CESAREAN SECTION N/A 03/17/2021   Procedure: CESAREAN SECTION;  Surgeon: [place] Bing, MD;  Location: [facility] LD ORS;  Service: Obstetrics;  Laterality: N/A;  .  CESAREAN SECTION N/A 09/15/2022   Procedure: CESAREAN SECTION;  Surgeon: [place] Bing, MD;  Location: [facility] LD ORS;  Service: Obstetrics;  Laterality: N/A;  . MULTIPLE EXTRACTIONS WITH ALVEOLOPLASTY N/A 10/11/2016   Procedure: MULTIPLE EXTRACTION WITH ALVEOLOPLASTY;  Surgeon: Ocie Doyne, DDS;  Location: [facility] OR;  Service: Oral Surgery;  Laterality: N/A;   Family History  Problem Relation Age of Onset  . Healthy Mother   . Diabetes Father   . Diabetes Brother    Social History   Tobacco Use  . Smoking status: Never  . Smokeless tobacco: Former  Advertising account planner  . Vaping status: Never Used  Substance Use Topics  . Alcohol use: Not Currently  . Drug use:  No   Allergies  Allergen Reactions  . Peanut-Containing Drug Products Itching and Swelling    Facial itching and swelling.   Medications Prior to Admission  Medication Sig Dispense Refill Last Dose/Taking  . albuterol (VENTOLIN HFA) 108 (90 Base) MCG/ACT inhaler Inhale 2 puffs into the lungs every 6 (six) hours as needed for wheezing or shortness of breath. 8 g 2 12/27/2023  . aspirin EC 81 MG tablet Take 2 tablets (162 mg total) by mouth daily. Swallow whole. 30 tablet 12 12/28/2023  . Magnesium Oxide -Mg Supplement (MAG-OXIDE) 200 MG TABS Take 2 tablets (400 mg total) by mouth at bedtime. If that amount causes loose stools in the am, switch to 200mg  daily at bedtime. 60 tablet 3 12/27/2023  . Prenatal Vit-Fe Fumarate-FA (PREPLUS) 27-1 MG TABS Take 1 tablet by mouth daily. 30 tablet 11 12/28/2023  . Blood Pressure Monitoring (BLOOD PRESSURE KIT) DEVI 1 each by Does not apply route as needed. 1 each 0   . cyclobenzaprine (FLEXERIL) 10 MG tablet Take 1 tablet (10 mg total) by mouth every 8 (eight) hours as needed for muscle spasms. 30 tablet 1   . NIFEdipine (PROCARDIA XL) 30 MG 24 hr tablet Take 1 tablet (30 mg total) by mouth daily. (Patient not taking: Reported on 11/26/2023) 30 tablet 11 More than a month    I have reviewed patient's Past  Medical Hx, Surgical Hx, Family Hx, Social Hx, medications and allergies.   ROS  Pertinent items noted in HPI and remainder of comprehensive ROS otherwise negative.   PHYSICAL EXAM  Patient Vitals for the past 24 hrs:  BP Temp Pulse Resp SpO2 Height  12/28/23 2316 (!) 140/70 -- 89 -- -- --  12/28/23 2315 -- -- -- -- 99 % --  12/28/23 2310 -- -- -- -- 98 % --  12/28/23 2305 -- -- -- -- 98 % --  12/28/23 2301 (!) 158/84 -- 84 -- -- --  12/28/23 2224 (!) 141/82 -- 90 -- -- --  12/28/23 2155 (!) 157/88 98.1 F (36.7 C) 88 18 -- 5\' 7"  (1.702 m)    Constitutional: Well-developed, well-nourished female in no acute distress.  Cardiovascular: normal rate & rhythm, warm and well-perfused Respiratory: normal effort, no problems with respiration noted GI: Abd soft, non-tender, gravid MS: Extremities nontender, no edema, normal ROM Neurologic: Alert and oriented x 4.  GU: no CVA tenderness Pelvic: NEFG, physiologic discharge, no blood, cervix clean.      Fetal Tracing: Baseline: Variability: Accelerations:  Decelerations: Toco:    Labs: Results for orders placed or performed during the hospital encounter of 12/28/23 (from the past 24 hours)  CBC     Status: Abnormal   Collection Time: 12/28/23 11:00 PM  Result Value Ref Range   WBC 11.2 (H) 4.0 - 10.5 K/uL   RBC 3.39 (L) 3.87 - 5.11 MIL/uL   Hemoglobin 9.9 (L) 12.0 - 15.0 g/dL   HCT 29.5 (L) 18.8 - 41.6 %   MCV 87.6 80.0 - 100.0 fL   MCH 29.2 26.0 - 34.0 pg   MCHC 33.3 30.0 - 36.0 g/dL   RDW 60.6 30.1 - 60.1 %   Platelets 172 150 - 400 K/uL   nRBC 0.0 0.0 - 0.2 %  Comprehensive metabolic panel     Status: Abnormal   Collection Time: 12/28/23 11:00 PM  Result Value Ref Range   Sodium 135 135 - 145 mmol/L   Potassium 3.5 3.5 - 5.1 mmol/L   Chloride 104 98 -  111 mmol/L   CO2 21 (L) 22 - 32 mmol/L   Glucose, Bld 78 70 - 99 mg/dL   BUN <5 (L) 6 - 20 mg/dL   Creatinine, Ser 1.47 0.44 - 1.00 mg/dL   Calcium 9.0 8.9 - 82.9  mg/dL   Total Protein 6.7 6.5 - 8.1 g/dL   Albumin 2.7 (L) 3.5 - 5.0 g/dL   AST 21 15 - 41 U/L   ALT 16 0 - 44 U/L   Alkaline Phosphatase 47 38 - 126 U/L   Total Bilirubin 0.7 0.0 - 1.2 mg/dL   GFR, Estimated >56 >21 mL/min   Anion gap 10 5 - 15    Imaging:  No results found.  MDM & MAU COURSE  MDM:  MAU Course: Orders Placed This Encounter  Procedures  . Urinalysis, Routine w reflex microscopic -Urine, Clean Catch  . CBC  . Comprehensive metabolic panel  . Protein / creatinine ratio, urine  . Notify physician (specify) Confirmatory reading of BP> 160/110 15 minutes later  . Apply Hypertensive Disorders of Pregnancy Care Plan  . Measure blood pressure  . Saline lock IV   Meds ordered this encounter  Medications  . AND Linked Order Group   . NIFEdipine (PROCARDIA) capsule 10 mg   . NIFEdipine (PROCARDIA) capsule 20 mg   . NIFEdipine (PROCARDIA) capsule 20 mg   . labetalol (NORMODYNE) injection 40 mg  . DISCONTD: ondansetron (ZOFRAN) 8 mg in sodium chloride 0.9 % 50 mL IVPB  . ondansetron (ZOFRAN-ODT) disintegrating tablet 8 mg  . cyclobenzaprine (FLEXERIL) tablet 10 mg  . acetaminophen (TYLENOL) tablet 1,000 mg    ASSESSMENT   1. Dichorionic diamniotic twin pregnancy in third trimester   2. Supervision of high risk pregnancy in third trimester   3. Pain of round ligament affecting pregnancy, antepartum     PLAN  Discharge home in stable condition with return precautions.  ***    Allergies as of 12/28/2023       Reactions   Peanut-containing Drug Products Itching, Swelling   Facial itching and swelling.     Med Rec must be completed prior to using this SMARTLINK***       Marcell Barlow, MSN, [facility], [facility] [employer]

## 2023-12-28 NOTE — MAU Note (Signed)
.  Brandy Brock is a 28 y.o. at [redacted]w[redacted]d here in MAU reporting: for the past 3 days c/o nausea and abd pain every time she eats. Has vomited a few times as well.   Deneis any vag bleeding or discharge . Good fetal movement felt/  LMP:  Onset of complaint: 3 days Pain score: 7 There were no vitals filed for this visit.   FHT: unable in triage  Lab orders placed from triage: u/a

## 2023-12-29 DIAGNOSIS — O30043 Twin pregnancy, dichorionic/diamniotic, third trimester: Secondary | ICD-10-CM

## 2023-12-29 DIAGNOSIS — I10 Essential (primary) hypertension: Secondary | ICD-10-CM

## 2023-12-29 DIAGNOSIS — Z3A32 32 weeks gestation of pregnancy: Secondary | ICD-10-CM

## 2023-12-29 LAB — URINALYSIS, ROUTINE W REFLEX MICROSCOPIC
Bilirubin Urine: NEGATIVE
Glucose, UA: NEGATIVE mg/dL
Hgb urine dipstick: NEGATIVE
Ketones, ur: 5 mg/dL — AB
Nitrite: NEGATIVE
Protein, ur: 30 mg/dL — AB
Specific Gravity, Urine: 1.018 (ref 1.005–1.030)
pH: 6 (ref 5.0–8.0)

## 2023-12-29 LAB — PROTEIN / CREATININE RATIO, URINE
Creatinine, Urine: 219 mg/dL
Protein Creatinine Ratio: 0.14 mg/mg{creat} (ref 0.00–0.15)
Total Protein, Urine: 31 mg/dL

## 2023-12-29 MED ORDER — LABETALOL HCL 100 MG PO TABS: 300.0000 mg | ORAL_TABLET | Freq: Two times a day (BID) | ORAL | 1 refills | Status: DC

## 2023-12-29 MED ORDER — CYCLOBENZAPRINE HCL 10 MG PO TABS: 10.0000 mg | ORAL_TABLET | Freq: Three times a day (TID) | ORAL | 0 refills | Status: DC | PRN

## 2023-12-29 MED ORDER — ONDANSETRON 4 MG PO TBDP: 8.0000 mg | ORAL_TABLET | Freq: Two times a day (BID) | ORAL | 0 refills | Status: DC | PRN

## 2023-12-29 NOTE — Discharge Instructions (Signed)
Increase PO Fluids Take Blood pressure medications BP check on Wed 12/31/23 - They will call you with an appoitment

## 2023-12-31 ENCOUNTER — Ambulatory Visit: Payer: Medicaid Other | Admitting: Certified Nurse Midwife

## 2023-12-31 ENCOUNTER — Ambulatory Visit (HOSPITAL_BASED_OUTPATIENT_CLINIC_OR_DEPARTMENT_OTHER): Payer: Medicaid Other | Admitting: Physical Therapy

## 2023-12-31 VITALS — BP 126/79 | HR 81 | Wt 380.8 lb

## 2023-12-31 DIAGNOSIS — Z3A32 32 weeks gestation of pregnancy: Secondary | ICD-10-CM | POA: Diagnosis not present

## 2023-12-31 DIAGNOSIS — O0993 Supervision of high risk pregnancy, unspecified, third trimester: Secondary | ICD-10-CM | POA: Diagnosis not present

## 2023-12-31 DIAGNOSIS — O219 Vomiting of pregnancy, unspecified: Secondary | ICD-10-CM | POA: Diagnosis not present

## 2023-12-31 DIAGNOSIS — I10 Essential (primary) hypertension: Secondary | ICD-10-CM | POA: Diagnosis not present

## 2023-12-31 MED ORDER — OMEPRAZOLE 40 MG PO CPDR: 40.0000 mg | DELAYED_RELEASE_CAPSULE | Freq: Every day | ORAL | 3 refills | Status: DC

## 2023-12-31 NOTE — Progress Notes (Signed)
   PRENATAL VISIT NOTE  Subjective:  Brandy Brock is a 28 y.o. G3P2002 at [redacted]w[redacted]d being seen today for ongoing prenatal care.  She is currently monitored for the following issues for this high-risk pregnancy and has BMI 60.0-69.9, adult (HCC); History of syphilis; Dichorionic diamniotic twin pregnancy; Chronic hypertension; Supervision of high-risk pregnancy; History of pre-eclampsia in prior pregnancy, currently pregnant; History of 2 cesarean sections; Obesity affecting pregnancy, antepartum; and Pain of round ligament affecting pregnancy, antepartum on their problem list.  Patient reports  still nauseous, vomiting about twice a day, usually after she eats but sometimes after she drinks fluids. Zofran minimally helpful. Does not think she caught a stomach bug as no one around her has been ill and doesn't remember eating anything questionable. Still having bilateral lower pelvic pain consistent with round ligament pain .  Contractions: Not present. Vag. Bleeding: None.  Movement: Present. Denies leaking of fluid.   The following portions of the patient's history were reviewed and updated as appropriate: allergies, current medications, past family history, past medical history, past social history, past surgical history and problem list.   Objective:   Vitals:   12/31/23 1045  BP: 126/79  Pulse: 81  Weight: (!) 380 lb 12.8 oz (172.7 kg)   Fetal Status: Fetal Heart Rate (bpm): 140/145   Movement: Present     General:  Alert, oriented and cooperative. Patient is in no acute distress.  Skin: Skin is warm and dry. No rash noted.   Cardiovascular: Normal heart rate noted  Respiratory: Normal respiratory effort, no problems with respiration noted  Abdomen: Soft, gravid, appropriate for gestational age.  Pain/Pressure: Present     Pelvic: Cervical exam deferred        Extremities: Normal range of motion.  Edema: None  Mental Status: Normal mood and affect. Normal behavior. Normal judgment and  thought content.   Assessment and Plan:  Pregnancy: G3P2002 at [redacted]w[redacted]d 1. Supervision of high risk pregnancy in third trimester (Primary) - Feeling vigorous fetal movement  2. [redacted] weeks gestation of pregnancy - Routine OB care   3. Chronic hypertension - Had been stable without taking meds (not non-compliant), but found to have elevated pressures in MAU and started on 300mg  labetalol BID. Tolerating well. - Continuing to take daily aspirin  4. Nausea/vomiting in pregnancy - Potentially getting over a viral illness, no upper abdominal pain so unlikely to be gallbladder related. Will trial daily prilosec since it happens mostly after eating and at night. - omeprazole (PRILOSEC) 40 MG capsule; Take 1 capsule (40 mg total) by mouth at bedtime.  Dispense: 30 capsule; Refill: 3  Preterm labor symptoms and general obstetric precautions including but not limited to vaginal bleeding, contractions, leaking of fluid and fetal movement were reviewed in detail with the patient. Please refer to After Visit Summary for other counseling recommendations.   Return in about 4 weeks (around 01/28/2024) for IN-PERSON, LOB.  Future Appointments  Date Time Provider Department Center  01/05/2024  9:30 AM Jeanmarie Hubert, PT [facility]  01/07/2024 10:55 AM Bernerd Limbo, CNM [facility]  01/08/2024  9:15 AM [facility] NURSE [facility]  01/08/2024  9:30 AM [facility] US4 [facility]  01/15/2024  9:15 AM [facility] NURSE [facility]  01/15/2024  9:30 AM [facility] US4 [facility]  01/22/2024  9:15 AM [facility] NURSE [facility]  01/22/2024  9:30 AM [facility]4 [facility]   Bernerd Limbo, CNM

## 2024-01-01 ENCOUNTER — Ambulatory Visit: Payer: Medicaid Other

## 2024-01-01 ENCOUNTER — Inpatient Hospital Stay (HOSPITAL_COMMUNITY)
Admission: AD | Admit: 2024-01-01 | Discharge: 2024-01-02 | Disposition: A | Discharge status: Home / Self Care | Payer: Medicaid Other | Attending: Obstetrics & Gynecology | Admitting: Obstetrics & Gynecology

## 2024-01-01 ENCOUNTER — Encounter: Payer: Self-pay | Admitting: Certified Nurse Midwife

## 2024-01-01 ENCOUNTER — Encounter (HOSPITAL_COMMUNITY): Payer: Self-pay | Admitting: Obstetrics & Gynecology

## 2024-01-01 DIAGNOSIS — Z3A32 32 weeks gestation of pregnancy: Secondary | ICD-10-CM | POA: Insufficient documentation

## 2024-01-01 DIAGNOSIS — R112 Nausea with vomiting, unspecified: Secondary | ICD-10-CM

## 2024-01-01 DIAGNOSIS — O30043 Twin pregnancy, dichorionic/diamniotic, third trimester: Secondary | ICD-10-CM | POA: Insufficient documentation

## 2024-01-01 DIAGNOSIS — O99013 Anemia complicating pregnancy, third trimester: Secondary | ICD-10-CM | POA: Insufficient documentation

## 2024-01-01 DIAGNOSIS — O10013 Pre-existing essential hypertension complicating pregnancy, third trimester: Secondary | ICD-10-CM | POA: Diagnosis not present

## 2024-01-01 DIAGNOSIS — O10919 Unspecified pre-existing hypertension complicating pregnancy, unspecified trimester: Secondary | ICD-10-CM

## 2024-01-01 DIAGNOSIS — O212 Late vomiting of pregnancy: Secondary | ICD-10-CM | POA: Diagnosis present

## 2024-01-01 DIAGNOSIS — D649 Anemia, unspecified: Secondary | ICD-10-CM | POA: Insufficient documentation

## 2024-01-01 LAB — PROTEIN / CREATININE RATIO, URINE
Creatinine, Urine: 292 mg/dL
Protein Creatinine Ratio: 0.14 mg/mg{creat} (ref 0.00–0.15)
Total Protein, Urine: 40 mg/dL

## 2024-01-01 LAB — COMPREHENSIVE METABOLIC PANEL
ALT: 16 U/L (ref 0–44)
AST: 22 U/L (ref 15–41)
Albumin: 2.8 g/dL — ABNORMAL LOW (ref 3.5–5.0)
Alkaline Phosphatase: 44 U/L (ref 38–126)
Anion gap: 9 (ref 5–15)
BUN: <5 mg/dL — ABNORMAL LOW (ref 6–20)
CO2: 22 mmol/L (ref 22–32)
Calcium: 9 mg/dL (ref 8.9–10.3)
Chloride: 104 mmol/L (ref 98–111)
Creatinine, Ser: 0.55 mg/dL (ref 0.44–1.00)
GFR, Estimated: >60 mL/min (ref >60)
Glucose, Bld: 83 mg/dL (ref 70–99)
Potassium: 4.2 mmol/L (ref 3.5–5.1)
Sodium: 135 mmol/L (ref 135–145)
Total Bilirubin: 0.9 mg/dL (ref 0.0–1.2)
Total Protein: 6.8 g/dL (ref 6.5–8.1)

## 2024-01-01 LAB — CBC
HCT: 32 % — ABNORMAL LOW (ref 36.0–46.0)
Hemoglobin: 10.3 g/dL — ABNORMAL LOW (ref 12.0–15.0)
MCH: 29.1 pg (ref 26.0–34.0)
MCHC: 32.2 g/dL (ref 30.0–36.0)
MCV: 90.4 fL (ref 80.0–100.0)
Platelets: 166 10*3/uL (ref 150–400)
RBC: 3.54 MIL/uL — ABNORMAL LOW (ref 3.87–5.11)
RDW: 14.9 % (ref 11.5–15.5)
WBC: 11 10*3/uL — ABNORMAL HIGH (ref 4.0–10.5)
nRBC: 0 % (ref 0.0–0.2)

## 2024-01-01 LAB — URINALYSIS, ROUTINE W REFLEX MICROSCOPIC
Bilirubin Urine: NEGATIVE
Glucose, UA: NEGATIVE mg/dL
Hgb urine dipstick: NEGATIVE
Ketones, ur: 5 mg/dL — AB
Leukocytes,Ua: NEGATIVE
Nitrite: NEGATIVE
Protein, ur: 30 mg/dL — AB
Specific Gravity, Urine: 1.025 (ref 1.005–1.030)
pH: 6 (ref 5.0–8.0)

## 2024-01-01 LAB — RESP PANEL BY RT-PCR (RSV, FLU A&B, COVID)  RVPGX2
Influenza A by PCR: NEGATIVE
Influenza B by PCR: NEGATIVE
Resp Syncytial Virus by PCR: NEGATIVE
SARS Coronavirus 2 by RT PCR: NEGATIVE

## 2024-01-01 LAB — LIPASE, BLOOD: Lipase: 30 U/L (ref 11–51)

## 2024-01-01 MED ORDER — ONDANSETRON HCL 4 MG/2ML IJ SOLN
4.0000 mg | Freq: Once | INTRAMUSCULAR | Status: AC
  Administered 2024-01-01: 4 mg via INTRAVENOUS
  Filled 2024-01-01: qty 2

## 2024-01-01 MED ORDER — METOCLOPRAMIDE HCL 5 MG/ML IJ SOLN
10.0000 mg | Freq: Once | INTRAMUSCULAR | Status: AC
  Administered 2024-01-01: 10 mg via INTRAVENOUS
  Filled 2024-01-01: qty 2

## 2024-01-01 MED ORDER — ACCRUFER 30 MG PO CAPS: 30.0000 mg | ORAL_CAPSULE | Freq: Every day | ORAL | 1 refills | Status: DC

## 2024-01-01 MED ORDER — LACTATED RINGERS IV BOLUS
1000.0000 mL | Freq: Once | INTRAVENOUS | Status: AC
  Administered 2024-01-01: 1000 mL via INTRAVENOUS

## 2024-01-01 MED ORDER — AMLODIPINE BESYLATE 10 MG PO TABS
10.0000 mg | ORAL_TABLET | Freq: Every day | ORAL | Status: DC
  Administered 2024-01-01: 10 mg via ORAL
  Filled 2024-01-01: qty 1

## 2024-01-01 MED ORDER — SODIUM CHLORIDE 0.9 % IV SOLN
25.0000 mg | Freq: Once | INTRAVENOUS | Status: AC
  Administered 2024-01-01: 25 mg via INTRAVENOUS
  Filled 2024-01-01: qty 1

## 2024-01-01 MED ORDER — SCOPOLAMINE 1 MG/3DAYS TD PT72
1.0000 | MEDICATED_PATCH | Freq: Once | TRANSDERMAL | Status: DC
  Administered 2024-01-01: 1.5 mg via TRANSDERMAL
  Filled 2024-01-01: qty 1

## 2024-01-01 MED ORDER — AMLODIPINE BESYLATE 10 MG PO TABS: 10.0000 mg | ORAL_TABLET | Freq: Every day | ORAL | 1 refills | Status: DC

## 2024-01-01 MED ORDER — LABETALOL HCL 100 MG PO TABS
300.0000 mg | ORAL_TABLET | Freq: Once | ORAL | Status: AC
  Administered 2024-01-01: 300 mg via ORAL
  Filled 2024-01-01: qty 3

## 2024-01-01 MED ORDER — SCOPOLAMINE 1 MG/3DAYS TD PT72: 1.0000 | MEDICATED_PATCH | Freq: Once | TRANSDERMAL | 3 refills | Status: AC

## 2024-01-01 MED ORDER — FAMOTIDINE IN NACL 20-0.9 MG/50ML-% IV SOLN
20.0000 mg | Freq: Once | INTRAVENOUS | Status: AC
  Administered 2024-01-01: 20 mg via INTRAVENOUS
  Filled 2024-01-01: qty 50

## 2024-01-01 NOTE — MAU Provider Note (Addendum)
Emesis    S Ms. Brandy Brock is a 28 y.o. G60P2002 pregnant female at [redacted]w[redacted]d who presents to MAU today with complaint of chronic emesis.  Started a week ago, 4 episodes today.  States able to eat and drink today.  States was able to tolerate light fluids and beef broth, greens, and toast.  Developed abdominal pain associated with nausea.  Denies VB, LOF.  Reports +FM of both twins. Noted to have "raspberry tea colored urine" upon collection.   Receives care at [facility]. Prenatal records reviewed.  Pertinent items noted in HPI and remainder of comprehensive ROS otherwise negative.   O BP 133/72   Pulse 92   Resp 20   LMP 05/14/2023 (Approximate)   SpO2 99%  Physical Exam Vitals and nursing note reviewed.  Constitutional:      General: She is not in acute distress.    Appearance: She is obese. She is ill-appearing.  HENT:     Head: Normocephalic and atraumatic.     Right Ear: External ear normal.     Left Ear: External ear normal.     Nose: Nose normal.     Mouth/Throat:     Mouth: Mucous membranes are dry.     Pharynx: Oropharynx is clear.  Eyes:     Extraocular Movements: Extraocular movements intact.     Conjunctiva/sclera: Conjunctivae normal.  Cardiovascular:     Rate and Rhythm: Normal rate.  Pulmonary:     Effort: Pulmonary effort is normal. No respiratory distress.  Abdominal:     General: Abdomen is flat. There is no distension.     Palpations: Abdomen is soft.     Tenderness: There is no abdominal tenderness.  Musculoskeletal:        General: No swelling. Normal range of motion.     Cervical back: Normal range of motion.  Skin:    General: Skin is warm and dry.  Neurological:     Mental Status: She is alert and oriented to person, place, and time. Mental status is at baseline.     Motor: No weakness.     Gait: Gait normal.  Psychiatric:        Mood and Affect: Mood normal.        Behavior: Behavior normal.    NST:  Baby A 140bpm, moderate, +accels, no  decels Baby B 145bpm, moderate, +accels, no decels No contractions    MDM: low MAU Course: Quad screen negative  UA amber color, hazy, no glucose, 30 protein (stable), noninfectious CMP reassuring, Cr 0.55 (@baseline ) Lipase 30 CBC downtrending  UPC 0.14  Given 4mg  IV Zofran and 1L LR bolus.  Moderate range Bps, will get PreE labs as above and give evening dose of labetalol. Pt still with moderate range Bps and one Severe Range BP noted but thought to be cause cuff was up against railing on bed.  Repeat moderate range.  Pt still nauseated s/p Zofran and fluid bolus.  Will give IV phenergan and Norvasc 10mg  and another fluid bolus before trying PO challenge.  Pt still nauseated so gave IV Reglan before attempt PO challenge. Spoke with mother and she believes she started having poorer appetite and eventual N/V after starting PNV with iron supplementation.  Recommended d/c'ing the new PNV and will send with better tolerated oral iron supplementation along with new prescription for Norvasc 10mg .  Message sent to CNM Walker to ensure follow up given Anemia.  Hgb today 10.3.    AP #32 weeks  #  DiDi Twins  #N/V  - d/c'ing PNV with oral iron supplementation in switch for old PNV and new oral iron supplementation formulation.   - message to OP provider to make aware given anemia #Chronic HTN - has been able to keep down her labetalol 300mg  BID with moderate range Bps here in MAU \  Signed out care to CNM Warren-Hill at 2215.  Brandy Bodo, MD Family Medicine - Obstetrics Fellow   - Patient with one additional episode of vomiting, added scopolamine patch to her medications. Passed PO challenge.  - Increased labetalol to 400mg  BID. Consider Nifedipine dependent on follow up BP readings at next [facility] appointment.  - Sent home in stable condition with routine precautions to return to MAU.    Brandy Brock, CNM 01/01/2024 11:57 PM

## 2024-01-01 NOTE — MAU Note (Signed)
.  Brandy Brock is a 28 y.o. at [redacted]w[redacted]d here in MAU reporting: nausea and vomiting for the past week with 4 episodes of vomiting today. She reports that she has been able to eat and drink today. Also reporting abdominal pain associated with nausea. Denies VB or LOF. Reports +FM from twins.  LMP: N/A Onset of complaint: Past week Pain score: 3/10 Vitals:   01/01/24 1700  BP: 136/82  Pulse: 82  Resp: 20  SpO2: 99%     FHT:  Twin Boy (A):  165 ; Twin Girl (B): 160  Lab orders placed from triage: UA

## 2024-01-02 MED ORDER — LABETALOL HCL 100 MG PO TABS: 400.0000 mg | ORAL_TABLET | Freq: Two times a day (BID) | ORAL | 1 refills | Status: DC

## 2024-01-02 MED ORDER — ONDANSETRON 4 MG PO TBDP
8.0000 mg | ORAL_TABLET | Freq: Once | ORAL | Status: AC
  Administered 2024-01-02: 8 mg via ORAL
  Filled 2024-01-02: qty 2

## 2024-01-05 ENCOUNTER — Ambulatory Visit (HOSPITAL_BASED_OUTPATIENT_CLINIC_OR_DEPARTMENT_OTHER): Payer: Medicaid Other | Admitting: Physical Therapy

## 2024-01-06 NOTE — Progress Notes (Addendum)
   PRENATAL VISIT NOTE  Subjective:  Brandy Brock is a 28 y.o. G3P2002 at [redacted]w[redacted]d being seen today for ongoing prenatal care.  She is currently monitored for the following issues for this high-risk pregnancy and has BMI 60.0-69.9, adult (HCC); History of syphilis; Dichorionic diamniotic twin pregnancy; Chronic hypertension; Supervision of high-risk pregnancy; History of pre-eclampsia in prior pregnancy, currently pregnant; History of 2 cesarean sections; Obesity affecting pregnancy, antepartum; and Pain of round ligament affecting pregnancy, antepartum on their problem list.  Patient reports {sx:14538}.   .  .   . Denies leaking of fluid.   The following portions of the patient's history were reviewed and updated as appropriate: allergies, current medications, past family history, past medical history, past social history, past surgical history and problem list.   Objective:  There were no vitals filed for this visit.  Fetal Status:           General:  Alert, oriented and cooperative. Patient is in no acute distress.  Skin: Skin is warm and dry. No rash noted.   Cardiovascular: Normal heart rate noted  Respiratory: Normal respiratory effort, no problems with respiration noted  Abdomen: Soft, gravid, appropriate for gestational age.        Pelvic: {Blank single:19197::"Cervical exam performed in the presence of a chaperone","Cervical exam deferred"}        Extremities: Normal range of motion.     Mental Status: Normal mood and affect. Normal behavior. Normal judgment and thought content.   Assessment and Plan:  Pregnancy: G3P2002 at [redacted]w[redacted]d 1. Supervision of high risk pregnancy in third trimester (Primary) ***  2. [redacted] weeks gestation of pregnancy ***  3. Pain of round ligament affecting pregnancy, antepartum ***  4. History of pre-eclampsia in prior pregnancy, currently pregnant ***  5. History of 2 cesarean sections ***  6. Dichorionic diamniotic twin pregnancy in third  trimester ***  Preterm labor symptoms and general obstetric precautions including but not limited to vaginal bleeding, contractions, leaking of fluid and fetal movement were reviewed in detail with the patient. Please refer to After Visit Summary for other counseling recommendations.   No follow-ups on file.  Future Appointments  Date Time Provider Department Center  01/07/2024 10:55 AM Osborne Oman [facility]  01/08/2024  9:15 AM [facility] NURSE [facility]  01/08/2024  9:30 AM [facility] US4 [facility]  01/15/2024  9:15 AM [facility] NURSE [facility]  01/15/2024  9:30 AM [facility] US4 [facility]  01/22/2024  9:15 AM [facility] NURSE [facility]  01/22/2024  9:30 AM [facility]4 [facility]    Terasa Orsini Shon Hough, Student-MidWife

## 2024-01-06 NOTE — Progress Notes (Unsigned)
   PRENATAL VISIT NOTE  Subjective:  Brandy Brock is a 28 y.o. G3P2002 at [redacted]w[redacted]d being seen today for ongoing prenatal care.  She is currently monitored for the following issues for this {Blank single:19197::"high-risk","low-risk"} pregnancy and has BMI 60.0-69.9, adult (HCC); History of syphilis; Dichorionic diamniotic twin pregnancy; Chronic hypertension; Supervision of high-risk pregnancy; History of pre-eclampsia in prior pregnancy, currently pregnant; History of 2 cesarean sections; Obesity affecting pregnancy, antepartum; and Pain of round ligament affecting pregnancy, antepartum on their problem list.  Patient reports {sx:14538}.   .  .   . Denies leaking of fluid.   The following portions of the patient's history were reviewed and updated as appropriate: allergies, current medications, past family history, past medical history, past social history, past surgical history and problem list.   Objective:  There were no vitals filed for this visit.  Fetal Status:           General:  Alert, oriented and cooperative. Patient is in no acute distress.  Skin: Skin is warm and dry. No rash noted.   Cardiovascular: Normal heart rate noted  Respiratory: Normal respiratory effort, no problems with respiration noted  Abdomen: Soft, gravid, appropriate for gestational age.        Pelvic: {Blank single:19197::"Cervical exam performed in the presence of a chaperone","Cervical exam deferred"}        Extremities: Normal range of motion.     Mental Status: Normal mood and affect. Normal behavior. Normal judgment and thought content.   Assessment and Plan:  Pregnancy: G3P2002 at [redacted]w[redacted]d 1. Supervision of high risk pregnancy in third trimester (Primary) ***  2. [redacted] weeks gestation of pregnancy ***  3. Pain of round ligament affecting pregnancy, antepartum ***  4. History of pre-eclampsia in prior pregnancy, currently pregnant ***  5. History of 2 cesarean sections ***  6. Dichorionic  diamniotic twin pregnancy in third trimester ***  {Blank single:19197::"Term","Preterm"} labor symptoms and general obstetric precautions including but not limited to vaginal bleeding, contractions, leaking of fluid and fetal movement were reviewed in detail with the patient. Please refer to After Visit Summary for other counseling recommendations.   No follow-ups on file.  Future Appointments  Date Time Provider Department Center  01/07/2024 10:55 AM Osborne Oman [facility]  01/08/2024  9:15 AM [facility] NURSE [facility]  01/08/2024  9:30 AM [facility] US4 [facility]  01/15/2024  9:15 AM [facility] NURSE [facility]  01/15/2024  9:30 AM [facility] US4 [facility]  01/22/2024  9:15 AM [facility] NURSE [facility]  01/22/2024  9:30 AM [facility]4 [facility]    Bernerd Limbo, CNM

## 2024-01-07 ENCOUNTER — Other Ambulatory Visit: Payer: Self-pay

## 2024-01-07 ENCOUNTER — Ambulatory Visit: Payer: Medicaid Other | Admitting: Certified Nurse Midwife

## 2024-01-07 ENCOUNTER — Encounter: Payer: Self-pay | Admitting: Certified Nurse Midwife

## 2024-01-07 VITALS — BP 103/67 | HR 92 | Wt 364.9 lb

## 2024-01-07 DIAGNOSIS — O0993 Supervision of high risk pregnancy, unspecified, third trimester: Secondary | ICD-10-CM

## 2024-01-07 DIAGNOSIS — Z98891 History of uterine scar from previous surgery: Secondary | ICD-10-CM | POA: Diagnosis not present

## 2024-01-07 DIAGNOSIS — O30043 Twin pregnancy, dichorionic/diamniotic, third trimester: Secondary | ICD-10-CM

## 2024-01-07 DIAGNOSIS — O09299 Supervision of pregnancy with other poor reproductive or obstetric history, unspecified trimester: Secondary | ICD-10-CM

## 2024-01-07 DIAGNOSIS — Z3A33 33 weeks gestation of pregnancy: Secondary | ICD-10-CM

## 2024-01-07 DIAGNOSIS — O26899 Other specified pregnancy related conditions, unspecified trimester: Secondary | ICD-10-CM

## 2024-01-07 DIAGNOSIS — O09293 Supervision of pregnancy with other poor reproductive or obstetric history, third trimester: Secondary | ICD-10-CM | POA: Diagnosis not present

## 2024-01-08 ENCOUNTER — Ambulatory Visit: Payer: Medicaid Other | Admitting: Obstetrics and Gynecology

## 2024-01-08 ENCOUNTER — Ambulatory Visit: Payer: Medicaid Other

## 2024-01-08 ENCOUNTER — Other Ambulatory Visit: Payer: Self-pay

## 2024-01-08 ENCOUNTER — Ambulatory Visit: Payer: Medicaid Other | Attending: Obstetrics

## 2024-01-08 VITALS — BP 145/63 | HR 85

## 2024-01-08 DIAGNOSIS — O3663X0 Maternal care for excessive fetal growth, third trimester, not applicable or unspecified: Secondary | ICD-10-CM

## 2024-01-08 DIAGNOSIS — R102 Pelvic and perineal pain unspecified side: Secondary | ICD-10-CM

## 2024-01-08 DIAGNOSIS — O10013 Pre-existing essential hypertension complicating pregnancy, third trimester: Secondary | ICD-10-CM | POA: Diagnosis present

## 2024-01-08 DIAGNOSIS — E6689 Other obesity not elsewhere classified: Secondary | ICD-10-CM | POA: Diagnosis not present

## 2024-01-08 DIAGNOSIS — O34219 Maternal care for unspecified type scar from previous cesarean delivery: Secondary | ICD-10-CM

## 2024-01-08 DIAGNOSIS — O99213 Obesity complicating pregnancy, third trimester: Secondary | ICD-10-CM

## 2024-01-08 DIAGNOSIS — E669 Obesity, unspecified: Secondary | ICD-10-CM

## 2024-01-08 DIAGNOSIS — O10913 Unspecified pre-existing hypertension complicating pregnancy, third trimester: Secondary | ICD-10-CM | POA: Diagnosis present

## 2024-01-08 DIAGNOSIS — O0993 Supervision of high risk pregnancy, unspecified, third trimester: Secondary | ICD-10-CM

## 2024-01-08 DIAGNOSIS — O30043 Twin pregnancy, dichorionic/diamniotic, third trimester: Secondary | ICD-10-CM

## 2024-01-08 DIAGNOSIS — Z3A33 33 weeks gestation of pregnancy: Secondary | ICD-10-CM

## 2024-01-08 DIAGNOSIS — O26899 Other specified pregnancy related conditions, unspecified trimester: Secondary | ICD-10-CM | POA: Insufficient documentation

## 2024-01-08 NOTE — Progress Notes (Signed)
 Maternal-Fetal Medicine Consultation I had the pleasure of seeing Ms. Br[facility]. She is G3 P2002 at 33w 3d gestation and is here for fetal growth assessment and antenatal testing. She has dichorionic-diamniotic twin pregnancy.  Patient has chronic hypertension and takes labetalol 400 mg twice daily.  Blood pressure today at our office is 145/63 mmHg.  She does not have signs and symptoms of severe features of preeclampsia.  Patient does not have gestational diabetes.  Obstetric history is significant for 2 term cesarean deliveries.  Ultrasound Dichorionic-diamniotic twin pregnancy.  Twin A: Lower fetus, maternal right, breech presentation, posterior placenta, female fetus.  Amniotic fluid is normal good fetal activity seen.  Fetal growth is appropriate for gestational age.  Antenatal testing is reassuring.  BPP 8/8. Placenta is posterior and there is no evidence of previa or placenta accreta spectrum.  Twin B: Upper fetus, maternal right, cephalic presentation, female fetus. Amniotic fluid is normal good fetal activity seen.  Fetal growth is appropriate for gestational age.  Antenatal testing is reassuring.  BPP 8/8.  Growth discordancy: 4% (normal).  I reassured the patient of normal fetal growth.  I encouraged her to take her antihypertensives regularly. In twin pregnancy is complicated with chronic hypertension we recommend delivery at [redacted] weeks gestation.  I discussed ultrasound protocol for monitoring twin pregnancy with chronic hypertension.  Recommendations -Continue weekly BPP till delivery.  Consultation including face-to-face (more than 50%) counseling 10 minutes.

## 2024-01-09 ENCOUNTER — Other Ambulatory Visit: Payer: Self-pay

## 2024-01-09 DIAGNOSIS — O30043 Twin pregnancy, dichorionic/diamniotic, third trimester: Secondary | ICD-10-CM

## 2024-01-09 DIAGNOSIS — O10919 Unspecified pre-existing hypertension complicating pregnancy, unspecified trimester: Secondary | ICD-10-CM

## 2024-01-15 ENCOUNTER — Ambulatory Visit: Payer: Medicaid Other | Attending: Obstetrics

## 2024-01-15 ENCOUNTER — Other Ambulatory Visit: Payer: Self-pay

## 2024-01-15 ENCOUNTER — Other Ambulatory Visit: Payer: Self-pay | Admitting: *Deleted

## 2024-01-15 ENCOUNTER — Ambulatory Visit: Payer: Medicaid Other | Admitting: *Deleted

## 2024-01-15 VITALS — BP 138/74 | HR 92

## 2024-01-15 DIAGNOSIS — O0993 Supervision of high risk pregnancy, unspecified, third trimester: Secondary | ICD-10-CM | POA: Diagnosis present

## 2024-01-15 DIAGNOSIS — E669 Obesity, unspecified: Secondary | ICD-10-CM

## 2024-01-15 DIAGNOSIS — O26899 Other specified pregnancy related conditions, unspecified trimester: Secondary | ICD-10-CM | POA: Diagnosis present

## 2024-01-15 DIAGNOSIS — R102 Pelvic and perineal pain: Secondary | ICD-10-CM | POA: Insufficient documentation

## 2024-01-15 DIAGNOSIS — O30043 Twin pregnancy, dichorionic/diamniotic, third trimester: Secondary | ICD-10-CM

## 2024-01-15 DIAGNOSIS — O99213 Obesity complicating pregnancy, third trimester: Secondary | ICD-10-CM

## 2024-01-15 DIAGNOSIS — Z3A34 34 weeks gestation of pregnancy: Secondary | ICD-10-CM

## 2024-01-15 DIAGNOSIS — O10013 Pre-existing essential hypertension complicating pregnancy, third trimester: Secondary | ICD-10-CM

## 2024-01-15 DIAGNOSIS — O10913 Unspecified pre-existing hypertension complicating pregnancy, third trimester: Secondary | ICD-10-CM

## 2024-01-15 DIAGNOSIS — O34219 Maternal care for unspecified type scar from previous cesarean delivery: Secondary | ICD-10-CM

## 2024-01-16 ENCOUNTER — Ambulatory Visit (HOSPITAL_BASED_OUTPATIENT_CLINIC_OR_DEPARTMENT_OTHER): Payer: Medicaid Other | Attending: Certified Nurse Midwife | Admitting: Physical Therapy

## 2024-01-16 ENCOUNTER — Encounter (HOSPITAL_BASED_OUTPATIENT_CLINIC_OR_DEPARTMENT_OTHER): Payer: Self-pay | Admitting: Physical Therapy

## 2024-01-16 DIAGNOSIS — M62838 Other muscle spasm: Secondary | ICD-10-CM | POA: Diagnosis present

## 2024-01-16 DIAGNOSIS — M6281 Muscle weakness (generalized): Secondary | ICD-10-CM | POA: Diagnosis present

## 2024-01-16 NOTE — Therapy (Signed)
 OUTPATIENT PHYSICAL THERAPY FEMALE PELVIC EVALUATION   Patient Name: Brandy Brock MRN: [id number] DOB:1996-09-12, 28 y.o., female Today's Date: 01/16/2024  END OF SESSION:  PT End of Session - 01/16/24 0915     Visit Number 2    Authorization Type Medicaid- [place]- waiting on auth    Authorization Time Period 03/04/2024    PT Start Time 0900    PT Stop Time 0938    PT Time Calculation (min) 38 min    Activity Tolerance Patient tolerated treatment well    Behavior During Therapy [facility] for tasks assessed/performed              Past Medical History:  Diagnosis Date   Asthma    childhood   Asthma 10/13/2008   Qualifier: Diagnosis of   By: Quincy Carnes   Have albuterol but has not used in pregnancy           Chlamydia    Chronic hypertension affecting pregnancy    Reports history of HTN but is not on medication at this time  10-02-20 157/89 and 143/89 at [facility] department     [facility]/M[facility] Guidelines for Antenatal Testing and Sonography                                                         Updated  01/29/2021 with Dr. Noralee Space           INDICATION    Growth U/S    BPP weekly    DELIVERY RECOMMENDATION ([place])                            CHTN    Group I   BP < 14   Contraception management 10/28/2022   Elevated liver function tests 12/11/2020   Elevated in MAU on 1/7 - repeat beginning of February    History of postpartum dehiscence of wound 04/22/2022   Hypertension    LGSIL of cervix of undetermined significance    Nausea/vomiting in pregnancy 10/24/2023   Postpartum care following cesarean delivery 10/28/2022   Pregnancy induced hypertension    Status post repeat low transverse cesarean section 04/30/2021   03/2021 LTCS: arrest of dilation 7cm   Syphilis affecting pregnancy    Past Surgical History:  Procedure Laterality Date   CESAREAN SECTION N/A 03/17/2021   Procedure: CESAREAN SECTION;  Surgeon: [place] Bing, MD;  Location: [facility] LD ORS;  Service: Obstetrics;   Laterality: N/A;   CESAREAN SECTION N/A 09/15/2022   Procedure: CESAREAN SECTION;  Surgeon: [place] Bing, MD;  Location: [facility] LD ORS;  Service: Obstetrics;  Laterality: N/A;   MULTIPLE EXTRACTIONS WITH ALVEOLOPLASTY N/A 10/11/2016   Procedure: MULTIPLE EXTRACTION WITH ALVEOLOPLASTY;  Surgeon: Ocie Doyne, DDS;  Location: [facility] OR;  Service: Oral Surgery;  Laterality: N/A;   Patient Active Problem List   Diagnosis Date Noted   Pain of round ligament affecting pregnancy, antepartum 10/24/2023   History of pre-eclampsia in prior pregnancy, currently pregnant 09/10/2023   History of 2 cesarean sections 09/10/2023   Obesity affecting pregnancy, antepartum 09/10/2023   Supervision of high-risk pregnancy 07/29/2023   Dichorionic diamniotic twin pregnancy 07/01/2023   Chronic hypertension 07/01/2023   History of syphilis 04/22/2022   BMI 60.0-69.9, adult (HCC) 10/13/2008    PCP:  Patient, No Pcp Per REFERRING PROVIDER: Bernerd Limbo, CNM REFERRING DIAG: [phone] (ICD-10-CM) - Bilateral hip pain   THERAPY DIAG:  Muscle weakness (generalized)  Other muscle spasm  Rationale for Evaluation and Treatment: Rehabilitation  ONSET DATE: 08/2023  SUBJECTIVE:                                                                                                                                                                                    SUBJECTIVE STATEMENT: Pt is now 34 weeks.  Had to cancel a few appointments due to illness.   Initial subjective Pt is 29 weeks and 3 days pregnant. She reports that she is having hip pain and low back pain.  Sometimes in unbearable, has to give herself a second, takes tylenol, tries to take not too much.  Had 2 previous C sections.  Oldest 28 yo and youngest is 1.  Has help Never had PT before  Fluid intake: Yes:      PAIN:  Are you having pain? Yes NPRS scale: 4-5/10 Pain location:  glutes. Lumbar spine area - no shooting pain  Pain type:  aching Pain description: constant   Aggravating factors: position, lying down too long, sitting too long, walking,random, worst at night when she goes to the bathroom Relieving factors: stretches to the side, moving  PRECAUTIONS: None  RED FLAGS: None   WEIGHT BEARING RESTRICTIONS: No  FALLS:  Has patient fallen in last 6 months? No  LIVING ENVIRONMENT: Lives with: lives with their family and lives with their sons              Lives in: House/apartment Stairs: No Has following equipment at home: None  OCCUPATION: Diplomatic Services operational officer at [employer], lot of walking.   PLOF: Independent  PATIENT GOALS: try to figure out what she can do at home too  PERTINENT HISTORY:  2 C sections Sexual abuse: No  BOWEL MOVEMENT: no issues URINATION: no issues  INTERCOURSE: no issues PREGNANCY: Vaginal deliveries 0 Tearing No C-section deliveries 2 Currently pregnant Yes: twins  PROLAPSE: None   OBJECTIVE:  Note: Objective measures were completed at Evaluation unless otherwise noted.  COGNITION: Overall cognitive status: Within functional limits for tasks assessed     SENSATION: Light touch: Appears intact Proprioception: Appears intact  MUSCLE LENGTH: Hamstrings: Right 80 deg; Left 80 deg T  LUMBAR SPECIAL TESTS:  Straight leg raise test: Negative    GAIT: Distance walked: 30 Assistive device utilized: None Level of assistance: Complete Independence Comments: slow, antalgic without support belt, imporved with support belt  POSTURE: rounded shoulders, forward head, and anterior pelvic tilt  PELVIC ALIGNMENT: even   LUMBARAROM/PROM: difficulty with  bending, extending, restricted in all planes due to pregnancy  LOWER EXTREMITY ROM: grossly within functional limitations    LOWER EXTREMITY MMT: at least 4- to 4/5 overall  PALPATION:   General  within functional limitations                 External Perineal Exam to be assessed if needed                              Internal Pelvic Floor to be assessed if needed  Patient confirms identification and approves PT to assess internal pelvic floor and treatment No  PELVIC MMT:   MMT eval  Vaginal   Internal Anal Sphincter   External Anal Sphincter   Puborectalis   Diastasis Recti   (Blank rows = not tested)        TONE: Low tone in abdomen, increased adipose tissue throughout  PROLAPSE: To be assessed if needed  TODAY'S TREATMENT:                                                                                                                               [facility] Adult PT Treatment:                                                DATE: 01/16/24 Pt seen for aquatic therapy today.  Treatment took place in water 3.5-4.75 ft in depth at the [facility] pool. Temp of water was 91.  Pt entered/exited the pool via stairs using alternating pattern with hand rail.  *intro to setting *walking holding to wall around perimeter of pool to 4.8 ft  *standing ue support on wall: df; pf; hip add/abd; relaxed squats; hip extension *walking forward and back, side ways ue support on barbell *L stretch verbal and TC for positioning and focus point of stretch; tail wagging *Seated on lift: cycling; hip add/abd  Pt requires the buoyancy and hydrostatic pressure of water for support, and to offload joints by unweighting joint load by at least 50 % in navel deep water and by at least 75-80% in chest to neck deep water.  Viscosity of the water is needed for resistance of strengthening. Water current perturbations provides challenge to standing balance requiring increased core activation.   PATIENT EDUCATION:  Education details: relevant anatomy, HEP, expectations of PT, serola belt, aquatherapy Person educated: Patient Education method: Explanation, Demonstration, and Handouts Education comprehension: verbalized understanding and needs further education  HOME EXERCISE  PROGRAM: [email or web address]  ASSESSMENT:  CLINICAL IMPRESSION: Pt demonstrates safety and independence in aquatic setting with therapist instructing from deck. She demonstrates slight apprehension upon entering water which reduces as session progresses moving throughout all depths easily.  Pt is directed through various movement patterns and trials in both sitting  and standing positions. She tolerates very well with reports of decrease pressure and pain while submerged.Able to gain a good LB stretch and bilateral hip movements unloaded for pain relief and strengthening. Goals are ongoing.     Initial Impression Patient is a 28 y.o. F who was seen today for physical therapy evaluation and treatment for hip and back pain in pregnancy. Pt with weakness throughout, did well with external stabilization with serola belt and sheet and core exercises. She will benefit from PT to reduce low back pain and hip pain and improve core strength before delivery.    OBJECTIVE IMPAIRMENTS: Abnormal gait, decreased endurance, difficulty walking, decreased ROM, decreased strength, obesity, and pain.   ACTIVITY LIMITATIONS: carrying, lifting, bending, sitting, standing, squatting, sleeping, and transfers  PARTICIPATION LIMITATIONS: community activity and occupation  PERSONAL FACTORS: Time since onset of injury/illness/exacerbation are also affecting patient's functional outcome.   REHAB POTENTIAL: Good  CLINICAL DECISION MAKING: Stable/uncomplicated  EVALUATION COMPLEXITY: Low   GOALS: Goals reviewed with patient? Yes  SHORT TERM GOALS: Target date: 01/29/2024 (adjusted due to delayed start of aquatics PT)    Pt will be I with initial HEP Baseline: Goal status: INITIAL  2.  Pt will be I with use of pregnancy support belt Baseline:  Goal status: INITIAL  3.  Pt will dem all exercises correctly Baseline:  Goal status: INITIAL   LONG TERM GOALS: Target date: 03/04/2024    Pt will dem improved bilateral  hip strength to 5/5 Baseline:  Goal status: INITIAL  2.  Pt will report reduced LBP to max 3/10 Baseline:  Goal status: INITIAL  3.  Pt will report reduced hip pain to max 2/10 Baseline:  Goal status: INITIAL  4.  Pt will be I advanced HEP Baseline:  Goal status: INITIAL  PLAN:  PT FREQUENCY: 1-2x/week  PT DURATION: 12 weeks  PLANNED INTERVENTIONS: 97110-Therapeutic exercises, 97530- Therapeutic activity, 97112- Neuromuscular re-education, 97535- Self Care, 40981- Manual therapy, [phone]- Aquatic Therapy, [phone]- Electrical stimulation (manual), Taping, Dry Needling, Joint mobilization, Joint manipulation, Spinal manipulation, Spinal mobilization, and Biofeedback  PLAN FOR NEXT SESSION: cont strengthening   Rushie Chestnut) Sarissa Dern MPT 01/16/24 9:16 AM [facility] [address] [place], [place], [address] Phone: [phone]   Fax:  [phone]

## 2024-01-20 ENCOUNTER — Encounter (HOSPITAL_BASED_OUTPATIENT_CLINIC_OR_DEPARTMENT_OTHER): Payer: Self-pay | Admitting: Physical Therapy

## 2024-01-20 ENCOUNTER — Ambulatory Visit (HOSPITAL_BASED_OUTPATIENT_CLINIC_OR_DEPARTMENT_OTHER): Payer: Self-pay | Admitting: Physical Therapy

## 2024-01-20 DIAGNOSIS — M62838 Other muscle spasm: Secondary | ICD-10-CM

## 2024-01-20 DIAGNOSIS — M6281 Muscle weakness (generalized): Secondary | ICD-10-CM | POA: Diagnosis not present

## 2024-01-20 NOTE — Therapy (Signed)
 OUTPATIENT PHYSICAL THERAPY FEMALE PELVIC TREATMENT   Patient Name: Brandy Brock MRN: [id number] DOB:[birth year], 28 y.o., female Today's Date: 01/20/2024  END OF SESSION:  PT End of Session - 01/20/24 1106     Visit Number 3    Authorization Type Medicaid- [place]- waiting on auth    Authorization Time Period 03/04/2024    PT Start Time 1100    PT Stop Time 1138    PT Time Calculation (min) 38 min    Behavior During Therapy [facility] for tasks assessed/performed              Past Medical History:  Diagnosis Date   Asthma    childhood   Asthma 10/13/2008   Qualifier: Diagnosis of   By: Quincy Carnes   Have albuterol but has not used in pregnancy           Chlamydia    Chronic hypertension affecting pregnancy    Reports history of HTN but is not on medication at this time  10-02-20 157/89 and 143/89 at [facility] department     [facility]/MFM Guidelines for Antenatal Testing and Sonography                                                         Updated  01/29/2021 with Dr. Noralee Space           INDICATION    Growth U/S    BPP weekly    DELIVERY RECOMMENDATION (GA)                            CHTN    Group I   BP < 14   Contraception management 10/28/2022   Elevated liver function tests 12/11/2020   Elevated in MAU on 1/7 - repeat beginning of February    History of postpartum dehiscence of wound 04/22/2022   Hypertension    LGSIL of cervix of undetermined significance    Nausea/vomiting in pregnancy 10/24/2023   Postpartum care following cesarean delivery 10/28/2022   Pregnancy induced hypertension    Status post repeat low transverse cesarean section 04/30/2021   03/2021 LTCS: arrest of dilation 7cm   Syphilis affecting pregnancy    Past Surgical History:  Procedure Laterality Date   CESAREAN SECTION N/A 03/17/2021   Procedure: CESAREAN SECTION;  Surgeon: [place] Bing, MD;  Location: [facility] LD ORS;  Service: Obstetrics;  Laterality: N/A;   CESAREAN SECTION N/A 09/15/2022    Procedure: CESAREAN SECTION;  Surgeon: [place] Bing, MD;  Location: [facility] LD ORS;  Service: Obstetrics;  Laterality: N/A;   MULTIPLE EXTRACTIONS WITH ALVEOLOPLASTY N/A 10/11/2016   Procedure: MULTIPLE EXTRACTION WITH ALVEOLOPLASTY;  Surgeon: Ocie Doyne, DDS;  Location: [facility] OR;  Service: Oral Surgery;  Laterality: N/A;   Patient Active Problem List   Diagnosis Date Noted   Pain of round ligament affecting pregnancy, antepartum 10/24/2023   History of pre-eclampsia in prior pregnancy, currently pregnant 09/10/2023   History of 2 cesarean sections 09/10/2023   Obesity affecting pregnancy, antepartum 09/10/2023   Supervision of high-risk pregnancy 07/29/2023   Dichorionic diamniotic twin pregnancy 07/01/2023   Chronic hypertension 07/01/2023   History of syphilis 04/22/2022   BMI 60.0-69.9, adult (HCC) 10/13/2008    PCP: Patient, No Pcp Per REFERRING PROVIDER: Bernerd Limbo,  CNM REFERRING DIAG: M25.551,M25.552 (ICD-10-CM) - Bilateral hip pain   THERAPY DIAG:  Muscle weakness (generalized)  Other muscle spasm  Rationale for Evaluation and Treatment: Rehabilitation  ONSET DATE: 08/2023  SUBJECTIVE:                                                                                                                                                                                    SUBJECTIVE STATEMENT: Pt is now 35 weeks and 1 day.  Pt reports she had relief while she was in the water, but then "typical soreness" that evening.  Her c-section has been scheduled.    Initial subjective Pt is 29 weeks and 3 days pregnant. She reports that she is having hip pain and low back pain.  Sometimes in unbearable, has to give herself a second, takes tylenol, tries to take not too much.  Had 2 previous C sections.  Oldest 28 yo and youngest is 1.  Has help Never had PT before  Fluid intake: Yes:      PAIN:  Are you having pain? Yes NPRS scale: 3/10 Pain location:  glutes. Lumbar spine area  -  Pain type: aching Pain description: constant   Aggravating factors: position, lying down too long, sitting too long, walking,random, worst at night when she goes to the bathroom Relieving factors: stretches to the side, moving  PRECAUTIONS: None  RED FLAGS: None   WEIGHT BEARING RESTRICTIONS: No  FALLS:  Has patient fallen in last 6 months? No  LIVING ENVIRONMENT: Lives with: lives with their family and lives with their sons              Lives in: House/apartment Stairs: No Has following equipment at home: None  OCCUPATION: Diplomatic Services operational officer at [employer], lot of walking.   PLOF: Independent  PATIENT GOALS: try to figure out what she can do at home too  PERTINENT HISTORY:  2 C sections Sexual abuse: No  BOWEL MOVEMENT: no issues URINATION: no issues  INTERCOURSE: no issues PREGNANCY: Vaginal deliveries 0 Tearing No C-section deliveries 2 Currently pregnant Yes: twins  PROLAPSE: None   OBJECTIVE:  Note: Objective measures were completed at Evaluation unless otherwise noted.  COGNITION: Overall cognitive status: Within functional limits for tasks assessed     SENSATION: Light touch: Appears intact Proprioception: Appears intact  MUSCLE LENGTH: Hamstrings: Right 80 deg; Left 80 deg T  LUMBAR SPECIAL TESTS:  Straight leg raise test: Negative    GAIT: Distance walked: 30 Assistive device utilized: None Level of assistance: Complete Independence Comments: slow, antalgic without support belt, imporved with support belt  POSTURE: rounded shoulders, forward head, and anterior pelvic tilt  PELVIC ALIGNMENT:  even   LUMBARAROM/PROM: difficulty with bending, extending, restricted in all planes due to pregnancy  LOWER EXTREMITY ROM: grossly within functional limitations    LOWER EXTREMITY MMT: at least 4- to 4/5 overall  PALPATION:   General  within functional limitations                 External Perineal Exam to be assessed if needed                              Internal Pelvic Floor to be assessed if needed  Patient confirms identification and approves PT to assess internal pelvic floor and treatment No  PELVIC MMT:   MMT eval  Vaginal   Internal Anal Sphincter   External Anal Sphincter   Puborectalis   Diastasis Recti   (Blank rows = not tested)        TONE: Low tone in abdomen, increased adipose tissue throughout  PROLAPSE: To be assessed if needed  TODAY'S TREATMENT:                                                                                                                               [facility] Adult PT Treatment:                                                DATE: 01/19/24 Pt seen for aquatic therapy today.  Treatment took place in water 3.5-4.75 ft in depth at the [facility] pool. Temp of water was 91.  Pt entered/exited the pool via stairs using alternating pattern with hand rail.  * unsupported walking forward/ backward x 3 laps * side stepping R/L -> with arm addct/ abdct -> with rainbow hand floats * farmer carry with bilat rainbow hand floats, marching forward / backward *standing ue support on wall: hip add/abd; Hip circles CW/CCW  * stradding noodle and holding corner: cycling, hip abdct/ addct, cross country ski * suspended with noodle under arms * UE on yellow noodle:  single leg clams x 10 each ; hip hinge with forward arm reach     Pt requires the buoyancy and hydrostatic pressure of water for support, and to offload joints by unweighting joint load by at least 50 % in navel deep water and by at least 75-80% in chest to neck deep water.  Viscosity of the water is needed for resistance of strengthening. Water current perturbations provides challenge to standing balance requiring increased core activation.   PATIENT EDUCATION:  Education details: aquatherapy Person educated: Patient Education method: Programmer, multimedia, Facilities manager, and Handouts Education comprehension: verbalized  understanding and needs further education  HOME EXERCISE PROGRAM: [email or web address]  ASSESSMENT:  CLINICAL IMPRESSION: Pt tolerated session very well reporting elimination of pain while submerged. Able to gain a good LB  stretch and bilateral hip movements unloaded for pain relief and strengthening. Requires UE support on wall when suspended on noodle (doesn't know how to swim).  Goals are ongoing.     Initial Impression Patient is a 28 y.o. F who was seen today for physical therapy evaluation and treatment for hip and back pain in pregnancy. Pt with weakness throughout, did well with external stabilization with serola belt and sheet and core exercises. She will benefit from PT to reduce low back pain and hip pain and improve core strength before delivery.    OBJECTIVE IMPAIRMENTS: Abnormal gait, decreased endurance, difficulty walking, decreased ROM, decreased strength, obesity, and pain.   ACTIVITY LIMITATIONS: carrying, lifting, bending, sitting, standing, squatting, sleeping, and transfers  PARTICIPATION LIMITATIONS: community activity and occupation  PERSONAL FACTORS: Time since onset of injury/illness/exacerbation are also affecting patient's functional outcome.   REHAB POTENTIAL: Good  CLINICAL DECISION MAKING: Stable/uncomplicated  EVALUATION COMPLEXITY: Low   GOALS: Goals reviewed with patient? Yes  SHORT TERM GOALS: Target date: 01/29/2024 (adjusted due to delayed start of aquatics PT)    Pt will be I with initial HEP Baseline: Goal status: INITIAL  2.  Pt will be I with use of pregnancy support belt Baseline:  Goal status: INITIAL  3.  Pt will dem all exercises correctly Baseline:  Goal status: INITIAL   LONG TERM GOALS: Target date: 03/04/2024    Pt will dem improved bilateral hip strength to 5/5 Baseline:  Goal status: INITIAL  2.  Pt will report reduced LBP to max 3/10 Baseline:  Goal status: INITIAL  3.  Pt will report reduced hip pain to max  2/10 Baseline:  Goal status: INITIAL  4.  Pt will be I advanced HEP Baseline:  Goal status: INITIAL  PLAN:  PT FREQUENCY: 1-2x/week  PT DURATION: 12 weeks  PLANNED INTERVENTIONS: 97110-Therapeutic exercises, 97530- Therapeutic activity, 97112- Neuromuscular re-education, 97535- Self Care, 16109- Manual therapy, [phone]- Aquatic Therapy, [phone]- Electrical stimulation (manual), Taping, Dry Needling, Joint mobilization, Joint manipulation, Spinal manipulation, Spinal mobilization, and Biofeedback  PLAN FOR NEXT SESSION: cont strengthening  Mayer Camel, PTA 01/20/24 12:22 PM [facility] [address] [place], [place], [address] Phone: [phone]   Fax:  [phone]

## 2024-01-21 ENCOUNTER — Other Ambulatory Visit (HOSPITAL_COMMUNITY)
Admission: RE | Admit: 2024-01-21 | Discharge: 2024-01-21 | Disposition: A | Discharge status: Home / Self Care | Source: Ambulatory Visit | Attending: Certified Nurse Midwife | Admitting: Certified Nurse Midwife

## 2024-01-21 ENCOUNTER — Other Ambulatory Visit: Payer: Self-pay

## 2024-01-21 ENCOUNTER — Ambulatory Visit: Payer: Self-pay | Admitting: Certified Nurse Midwife

## 2024-01-21 ENCOUNTER — Encounter (HOSPITAL_COMMUNITY): Payer: Self-pay

## 2024-01-21 VITALS — BP 130/82 | HR 103 | Wt 385.1 lb

## 2024-01-21 DIAGNOSIS — O0993 Supervision of high risk pregnancy, unspecified, third trimester: Secondary | ICD-10-CM

## 2024-01-21 DIAGNOSIS — O10919 Unspecified pre-existing hypertension complicating pregnancy, unspecified trimester: Secondary | ICD-10-CM

## 2024-01-21 DIAGNOSIS — O26899 Other specified pregnancy related conditions, unspecified trimester: Secondary | ICD-10-CM

## 2024-01-21 DIAGNOSIS — O10913 Unspecified pre-existing hypertension complicating pregnancy, third trimester: Secondary | ICD-10-CM

## 2024-01-21 DIAGNOSIS — O09293 Supervision of pregnancy with other poor reproductive or obstetric history, third trimester: Secondary | ICD-10-CM

## 2024-01-21 DIAGNOSIS — Z3A35 35 weeks gestation of pregnancy: Secondary | ICD-10-CM

## 2024-01-21 DIAGNOSIS — O30043 Twin pregnancy, dichorionic/diamniotic, third trimester: Secondary | ICD-10-CM | POA: Diagnosis not present

## 2024-01-21 DIAGNOSIS — O26893 Other specified pregnancy related conditions, third trimester: Secondary | ICD-10-CM

## 2024-01-21 DIAGNOSIS — O09299 Supervision of pregnancy with other poor reproductive or obstetric history, unspecified trimester: Secondary | ICD-10-CM

## 2024-01-21 DIAGNOSIS — R519 Headache, unspecified: Secondary | ICD-10-CM

## 2024-01-21 DIAGNOSIS — Z98891 History of uterine scar from previous surgery: Secondary | ICD-10-CM

## 2024-01-21 LAB — POCT URINALYSIS DIP (DEVICE)
Bilirubin Urine: NEGATIVE
Glucose, UA: NEGATIVE mg/dL
Hgb urine dipstick: NEGATIVE
Ketones, ur: NEGATIVE mg/dL
Nitrite: NEGATIVE
Protein, ur: NEGATIVE mg/dL
Specific Gravity, Urine: 1.025 (ref 1.005–1.030)
Urobilinogen, UA: 1 mg/dL (ref 0.0–1.0)
pH: 7 (ref 5.0–8.0)

## 2024-01-21 MED ORDER — ACETAMINOPHEN 500 MG PO TABS
1000.0000 mg | ORAL_TABLET | Freq: Once | ORAL | Status: AC
  Administered 2024-01-21: 1000 mg via ORAL

## 2024-01-21 NOTE — Progress Notes (Addendum)
   PRENATAL VISIT NOTE  Subjective:  Brandy Brock is a 28 y.o. G3P2002 at [redacted]w[redacted]d being seen today for ongoing prenatal care.  She is currently monitored for the following issues for this high-risk pregnancy and has BMI 60.0-69.9, adult (HCC); History of syphilis; Chronic hypertension during pregnancy; Dichorionic diamniotic twin pregnancy; Supervision of high-risk pregnancy; History of pre-eclampsia in prior pregnancy, currently pregnant; History of 2 cesarean sections; Obesity affecting pregnancy, antepartum; and Pain of round ligament affecting pregnancy, antepartum on their problem list.  Patient reports headache.  Contractions: Not present. Vag. Bleeding: None.  Movement: Present. Denies leaking of fluid.   The following portions of the patient's history were reviewed and updated as appropriate: allergies, current medications, past family history, past medical history, past social history, past surgical history and problem list.   Objective:   Vitals:   01/21/24 1624 01/21/24 1750  BP: (!) 141/84 130/82  Pulse: (!) 103 (!) 103  Weight: (!) 174.7 kg     Fetal Status: Fetal Heart Rate (bpm): 145/155   Movement: Present     General:  Alert, oriented and cooperative. Patient is in no acute distress.  Skin: Skin is warm and dry. No rash noted.   Cardiovascular: Normal heart rate noted  Respiratory: Normal respiratory effort, no problems with respiration noted  Abdomen: Soft, gravid, appropriate for gestational age.  Pain/Pressure: Present     Pelvic: Cervical exam deferred        Extremities: Normal range of motion.  Edema: Moderate pitting, indentation subsides rapidly  Mental Status: Normal mood and affect. Normal behavior. Normal judgment and thought content.   Assessment and Plan:  Pregnancy: G3P2002 at [redacted]w[redacted]d 1. Supervision of high risk pregnancy in third trimester (Primary) -Patient reports +FM  2. [redacted] weeks gestation of pregnancy -Routine OB care  3. Chronic hypertension  during pregnancy -Initial BP elevated today, repeat WNL.   4. Dichorionic diamniotic twin pregnancy in third trimester - Being followed by MFM  5. History of 2 cesarean sections -Repeat cesarean scheduled for 37 weeks  6. History of pre-eclampsia in prior pregnancy, currently pregnant -PEC labs collected due to HA and HTN history. -Dipstick negative for protein. -PO tylenol given for HA.   Preterm labor symptoms and general obstetric precautions including but not limited to vaginal bleeding, contractions, leaking of fluid and fetal movement were reviewed in detail with the patient. Please refer to After Visit Summary for other counseling recommendations.     Future Appointments  Date Time Provider Department Center  01/22/2024  9:15 AM [facility] NURSE [facility]  01/22/2024  9:30 AM [facility] US4 [facility]  01/28/2024  8:00 AM Carlson-Long, Jennifer L [facility]  01/28/2024  1:55 PM Bernerd Limbo, CNM [facility]  01/29/2024  9:15 AM [facility] NURSE [facility]  01/29/2024  9:30 AM [facility] US4 [facility]  01/30/2024  8:00 AM Carlson-Long, Jennifer L [facility]  02/02/2024 11:00 AM [facility]-LD PAT 1 MC-INDC None  02/04/2024  2:35 PM Bernerd Limbo, CNM [facility]    Elige Ko, Student-MidWife

## 2024-01-21 NOTE — Patient Instructions (Signed)
 Brandy Brock  01/21/2024   Your procedure is scheduled on:  02/04/2024  Arrive at 1:45 PM at Entrance C on [address] at [facility]  and [employer]. You are invited to use the FREE valet parking or use the Visitor's parking deck.  Pick up the phone at the desk and dial [phone].  Call this number if you have problems the morning of surgery: [phone]  Remember:   Do not eat food:(After Midnight) Desps de medianoche.  You may drink clear liquids until arrival at __1:45 PM___.  Clear liquids means a liquid you can see thru.  It can have color such as Cola or Kool aid.  Tea is OK and coffee as long as no milk or creamer of any kind.  Take these medicines the morning of surgery with A SIP OF WATER:  Take labetalol as prescribed and bring your inhaler   Do not wear jewelry, make-up or nail polish.  Do not wear lotions, powders, or perfumes. Do not wear deodorant.  Do not shave 48 hours prior to surgery.  Do not bring valuables to the hospital.  [facility] is not   responsible for any belongings or valuables brought to the hospital.  Contacts, dentures or bridgework may not be worn into surgery.  Leave suitcase in the car. After surgery it may be brought to your room.  For patients admitted to the hospital, checkout time is 11:00 AM the day of              discharge.      Please read over the following fact sheets that you were given:     Preparing for Surgery

## 2024-01-22 ENCOUNTER — Ambulatory Visit: Payer: Medicaid Other | Attending: Obstetrics

## 2024-01-22 ENCOUNTER — Ambulatory Visit: Payer: Medicaid Other

## 2024-01-22 ENCOUNTER — Other Ambulatory Visit: Payer: Self-pay

## 2024-01-22 VITALS — BP 146/85 | HR 103

## 2024-01-22 DIAGNOSIS — O35BXX0 Maternal care for other (suspected) fetal abnormality and damage, fetal cardiac anomalies, not applicable or unspecified: Secondary | ICD-10-CM

## 2024-01-22 DIAGNOSIS — O26899 Other specified pregnancy related conditions, unspecified trimester: Secondary | ICD-10-CM | POA: Diagnosis present

## 2024-01-22 DIAGNOSIS — O30043 Twin pregnancy, dichorionic/diamniotic, third trimester: Secondary | ICD-10-CM | POA: Diagnosis present

## 2024-01-22 DIAGNOSIS — O34219 Maternal care for unspecified type scar from previous cesarean delivery: Secondary | ICD-10-CM

## 2024-01-22 DIAGNOSIS — O99213 Obesity complicating pregnancy, third trimester: Secondary | ICD-10-CM | POA: Insufficient documentation

## 2024-01-22 DIAGNOSIS — O0993 Supervision of high risk pregnancy, unspecified, third trimester: Secondary | ICD-10-CM

## 2024-01-22 DIAGNOSIS — O10913 Unspecified pre-existing hypertension complicating pregnancy, third trimester: Secondary | ICD-10-CM | POA: Insufficient documentation

## 2024-01-22 DIAGNOSIS — E669 Obesity, unspecified: Secondary | ICD-10-CM | POA: Diagnosis not present

## 2024-01-22 DIAGNOSIS — O10013 Pre-existing essential hypertension complicating pregnancy, third trimester: Secondary | ICD-10-CM

## 2024-01-22 DIAGNOSIS — R102 Pelvic and perineal pain: Secondary | ICD-10-CM | POA: Diagnosis present

## 2024-01-22 DIAGNOSIS — Z3A35 35 weeks gestation of pregnancy: Secondary | ICD-10-CM

## 2024-01-23 LAB — CERVICOVAGINAL ANCILLARY ONLY
Chlamydia: NEGATIVE
Comment: NEGATIVE
Comment: NORMAL
Neisseria Gonorrhea: NEGATIVE

## 2024-01-25 ENCOUNTER — Inpatient Hospital Stay (HOSPITAL_COMMUNITY)
Admission: AD | Admit: 2024-01-25 | Discharge: 2024-01-25 | Disposition: A | Discharge status: Home / Self Care | Attending: Obstetrics and Gynecology | Admitting: Obstetrics and Gynecology

## 2024-01-25 ENCOUNTER — Other Ambulatory Visit: Payer: Self-pay

## 2024-01-25 ENCOUNTER — Encounter (HOSPITAL_COMMUNITY): Payer: Self-pay | Admitting: Obstetrics and Gynecology

## 2024-01-25 DIAGNOSIS — O4703 False labor before 37 completed weeks of gestation, third trimester: Secondary | ICD-10-CM | POA: Diagnosis present

## 2024-01-25 DIAGNOSIS — O10113 Pre-existing hypertensive heart disease complicating pregnancy, third trimester: Secondary | ICD-10-CM | POA: Diagnosis not present

## 2024-01-25 DIAGNOSIS — Z3A35 35 weeks gestation of pregnancy: Secondary | ICD-10-CM | POA: Diagnosis not present

## 2024-01-25 DIAGNOSIS — O10919 Unspecified pre-existing hypertension complicating pregnancy, unspecified trimester: Secondary | ICD-10-CM

## 2024-01-25 DIAGNOSIS — Z79899 Other long term (current) drug therapy: Secondary | ICD-10-CM | POA: Diagnosis not present

## 2024-01-25 DIAGNOSIS — O30043 Twin pregnancy, dichorionic/diamniotic, third trimester: Secondary | ICD-10-CM | POA: Diagnosis not present

## 2024-01-25 DIAGNOSIS — O479 False labor, unspecified: Secondary | ICD-10-CM | POA: Diagnosis not present

## 2024-01-25 LAB — COMPREHENSIVE METABOLIC PANEL
ALT: 15 U/L (ref 0–44)
AST: 19 U/L (ref 15–41)
Albumin: 2.5 g/dL — ABNORMAL LOW (ref 3.5–5.0)
Alkaline Phosphatase: 63 U/L (ref 38–126)
Anion gap: 13 (ref 5–15)
BUN: <5 mg/dL — ABNORMAL LOW (ref 6–20)
CO2: 19 mmol/L — ABNORMAL LOW (ref 22–32)
Calcium: 9.1 mg/dL (ref 8.9–10.3)
Chloride: 105 mmol/L (ref 98–111)
Creatinine, Ser: 0.44 mg/dL (ref 0.44–1.00)
GFR, Estimated: >60 mL/min (ref >60)
Glucose, Bld: 98 mg/dL (ref 70–99)
Potassium: 3.8 mmol/L (ref 3.5–5.1)
Sodium: 137 mmol/L (ref 135–145)
Total Bilirubin: 0.8 mg/dL (ref 0.0–1.2)
Total Protein: 6.4 g/dL — ABNORMAL LOW (ref 6.5–8.1)

## 2024-01-25 LAB — URINALYSIS, ROUTINE W REFLEX MICROSCOPIC
Bilirubin Urine: NEGATIVE
Glucose, UA: NEGATIVE mg/dL
Hgb urine dipstick: NEGATIVE
Ketones, ur: NEGATIVE mg/dL
Nitrite: NEGATIVE
Protein, ur: NEGATIVE mg/dL
Specific Gravity, Urine: 1.017 (ref 1.005–1.030)
pH: 6 (ref 5.0–8.0)

## 2024-01-25 LAB — PROTEIN / CREATININE RATIO, URINE
Creatinine, Urine: 128 mg/dL
Protein Creatinine Ratio: 0.18 mg/mg{creat} — ABNORMAL HIGH (ref 0.00–0.15)
Total Protein, Urine: 23 mg/dL

## 2024-01-25 LAB — CBC
HCT: 30.1 % — ABNORMAL LOW (ref 36.0–46.0)
Hemoglobin: 10.2 g/dL — ABNORMAL LOW (ref 12.0–15.0)
MCH: 29.7 pg (ref 26.0–34.0)
MCHC: 33.9 g/dL (ref 30.0–36.0)
MCV: 87.5 fL (ref 80.0–100.0)
Platelets: 158 10*3/uL (ref 150–400)
RBC: 3.44 MIL/uL — ABNORMAL LOW (ref 3.87–5.11)
RDW: 14 % (ref 11.5–15.5)
WBC: 10.3 10*3/uL (ref 4.0–10.5)
nRBC: 0 % (ref 0.0–0.2)

## 2024-01-25 NOTE — MAU Note (Addendum)
 Brandy Brock is a 28 y.o. at [redacted]w[redacted]d here in MAU reporting: ctx that are 3 min apart now, she reports they've been 3-73min apart since yesterday around 6pm. She reports she has a c-section scheduled for 3/26. Denies VB and LOF. Endorses +FM.    Onset of complaint: yesterday at 6pm Pain score: 7/10 abdomen and back Vitals:   01/25/24 1323  BP: (!) 150/96  Pulse: (!) 106  Resp: 19  Temp: 98.5 F (36.9 C)  SpO2: 97%     FHT:  baby B 161 Baby A 156 Lab orders placed from triage: none

## 2024-01-25 NOTE — MAU Provider Note (Signed)
 History     CSN: [id number]  Arrival date and time: 01/25/24 1249   Event Date/Time   First Provider Initiated Contact with Patient 01/25/24 1343      Chief Complaint  Patient presents with   Contractions   HPI Brandy Brock is a 28 y.o. G3P2002 at [redacted]w[redacted]d with di/di twins who presents for contractions. Reports contractions every 5 minutes since last night. Denies n/v/d, dysuria, vaginal bleeding, or LOF. Positive fetal movement x 2. Hx CHTN. Takes labetalol 400 BID. Last dose was this morning. Denies headache, visual disturbance, or epigastric pain.   OB History     Gravida  3   Para  2   Term  2   Preterm  0   AB  0   Living  2      SAB  0   IAB  0   Ectopic  0   Multiple  0   Live Births  2           Past Medical History:  Diagnosis Date   Asthma    childhood   Asthma 10/13/2008   Qualifier: Diagnosis of   By: Quincy Carnes   Have albuterol but has not used in pregnancy           Chlamydia    Chronic hypertension affecting pregnancy    Reports history of HTN but is not on medication at this time  10-02-20 157/89 and 143/89 at [facility] department     [facility] Guidelines for Antenatal Testing and Sonography                                                         Updated  01/29/2021 with Dr. Noralee Space           INDICATION    Growth U/S    BPP weekly    DELIVERY RECOMMENDATION (GA)                            CHTN    Group I   BP < 14   Contraception management 10/28/2022   Elevated liver function tests 12/11/2020   Elevated in MAU on 1/7 - repeat beginning of February    History of postpartum dehiscence of wound 04/22/2022   Hypertension    LGSIL of cervix of undetermined significance    Nausea/vomiting in pregnancy 10/24/2023   Postpartum care following cesarean delivery 10/28/2022   Pregnancy induced hypertension    Status post repeat low transverse cesarean section 04/30/2021   03/2021 LTCS: arrest of dilation 7cm   Syphilis affecting  pregnancy    Vaginal Pap smear, abnormal     Past Surgical History:  Procedure Laterality Date   CESAREAN SECTION N/A 03/17/2021   Procedure: CESAREAN SECTION;  Surgeon: Beulah Beach Bing, MD;  Location: [facility] LD ORS;  Service: Obstetrics;  Laterality: N/A;   CESAREAN SECTION N/A 09/15/2022   Procedure: CESAREAN SECTION;  Surgeon: [place] Bing, MD;  Location: [facility] LD ORS;  Service: Obstetrics;  Laterality: N/A;   MULTIPLE EXTRACTIONS WITH ALVEOLOPLASTY N/A 10/11/2016   Procedure: MULTIPLE EXTRACTION WITH ALVEOLOPLASTY;  Surgeon: Ocie Doyne, DDS;  Location: [facility] OR;  Service: Oral Surgery;  Laterality: N/A;    Family History  Problem Relation Age of Onset  Healthy Mother    Diabetes Father    Diabetes Brother     Social History   Tobacco Use   Smoking status: Never   Smokeless tobacco: Former  Building services engineer status: Never Used  Substance Use Topics   Alcohol use: Not Currently   Drug use: No    Allergies:  Allergies  Allergen Reactions   Peanut-Containing Drug Products Itching and Swelling    Facial itching and swelling.    Medications Prior to Admission  Medication Sig Dispense Refill Last Dose/Taking   albuterol (VENTOLIN HFA) 108 (90 Base) MCG/ACT inhaler Inhale 2 puffs into the lungs every 6 (six) hours as needed for wheezing or shortness of breath. 8 g 2 Past Month   aspirin EC 81 MG tablet Take 2 tablets (162 mg total) by mouth daily. Swallow whole. 30 tablet 12 01/24/2024   cyclobenzaprine (FLEXERIL) 10 MG tablet Take 1 tablet (10 mg total) by mouth 3 (three) times daily as needed for muscle spasms. 20 tablet 0 01/24/2024 at  6:30 PM   labetalol (NORMODYNE) 100 MG tablet Take 4 tablets (400 mg total) by mouth 2 (two) times daily. 240 tablet 1 01/25/2024 at 11:00 AM   omeprazole (PRILOSEC) 40 MG capsule Take 1 capsule (40 mg total) by mouth at bedtime. 30 capsule 3 Past Week   acetaminophen (TYLENOL) 325 MG tablet Take 650 mg by mouth every 6 (six) hours as needed.       Blood Pressure Monitoring (BLOOD PRESSURE KIT) DEVI 1 each by Does not apply route as needed. 1 each 0    Ferric Maltol (ACCRUFER) 30 MG CAPS Take 1 capsule (30 mg total) by mouth daily. (Patient not taking: Reported on 01/21/2024) 30 capsule 1    Magnesium Oxide -Mg Supplement (MAG-OXIDE) 200 MG TABS Take 2 tablets (400 mg total) by mouth at bedtime. If that amount causes loose stools in the am, switch to 200mg  daily at bedtime. 60 tablet 3    ondansetron (ZOFRAN-ODT) 4 MG disintegrating tablet Take 2 tablets (8 mg total) by mouth 2 (two) times daily as needed for nausea or vomiting. 20 tablet 0    scopolamine (TRANSDERM-SCOP) 1 MG/3DAYS Place 1 patch onto the skin every 3 (three) days. (Patient not taking: Reported on 01/21/2024)       Review of Systems  All other systems reviewed and are negative.  Physical Exam   Blood pressure (!) 142/90, pulse (!) 103, temperature 98.5 F (36.9 C), temperature source Oral, resp. rate 19, height 5\' 7"  (1.702 m), weight (!) 174.6 kg, last menstrual period 05/14/2023, SpO2 97%, not currently breastfeeding. Patient Vitals for the past 24 hrs:  BP Temp Temp src Pulse Resp SpO2 Height Weight  01/25/24 1645 (!) 151/94 -- -- 89 -- 97 % -- --  01/25/24 1630 (!) 150/86 -- -- 90 -- 97 % -- --  01/25/24 1615 139/82 -- -- 91 -- 98 % -- --  01/25/24 1601 (!) 140/95 -- -- 94 -- -- -- --  01/25/24 1546 (!) 151/96 -- -- 92 -- -- -- --  01/25/24 1531 (!) 153/92 -- -- 86 -- -- -- --  01/25/24 1515 (!) 156/95 -- -- 91 -- 97 % -- --  01/25/24 1509 (!) 154/89 -- -- 93 -- -- -- --  01/25/24 1416 (!) 149/95 -- -- 97 -- -- -- --  01/25/24 1401 (!) 148/92 -- -- (!) 102 -- -- -- --  01/25/24 1346 (!) 143/90 -- -- 97 -- -- -- --  01/25/24 1331 (!) 142/90 -- -- (!) 103 -- -- -- --  01/25/24 1323 (!) 150/96 98.5 F (36.9 C) Oral (!) 106 19 97 % -- --  01/25/24 1306 -- -- -- -- -- -- 5\' 7"  (1.702 m) (!) 174.6 kg     Physical Exam Vitals and nursing note reviewed.   Constitutional:      General: She is not in acute distress.    Appearance: She is well-developed. She is not ill-appearing.  HENT:     Head: Normocephalic and atraumatic.  Eyes:     General: No scleral icterus.       Right eye: No discharge.        Left eye: No discharge.     Conjunctiva/sclera: Conjunctivae normal.  Pulmonary:     Effort: Pulmonary effort is normal. No respiratory distress.  Genitourinary:    Comments: Dilation: 2.5 Effacement (%): 50 Station: -3 Presentation: Vertex Exam by:: Judeth Horn, NP  Neurological:     General: No focal deficit present.     Mental Status: She is alert.  Psychiatric:        Mood and Affect: Mood normal.        Behavior: Behavior normal.    Fetal Tracing: Baby A Baseline: 145 Variability: moderate Accelerations: 15x15 Decelerations: none  Baby B Baseline: 150 Variability: moderate Accelerations: 15x15 Decelerations: none  Toco: irregular contractions, Q10-20 mins   MAU Course  Procedures Results for orders placed or performed during the hospital encounter of 01/25/24 (from the past 24 hours)  CBC     Status: Abnormal   Collection Time: 01/25/24  2:03 PM  Result Value Ref Range   WBC 10.3 4.0 - 10.5 K/uL   RBC 3.44 (L) 3.87 - 5.11 MIL/uL   Hemoglobin 10.2 (L) 12.0 - 15.0 g/dL   HCT 11.9 (L) 14.7 - 82.9 %   MCV 87.5 80.0 - 100.0 fL   MCH 29.7 26.0 - 34.0 pg   MCHC 33.9 30.0 - 36.0 g/dL   RDW 56.2 13.0 - 86.5 %   Platelets 158 150 - 400 K/uL   nRBC 0.0 0.0 - 0.2 %  Comprehensive metabolic panel     Status: Abnormal   Collection Time: 01/25/24  2:03 PM  Result Value Ref Range   Sodium 137 135 - 145 mmol/L   Potassium 3.8 3.5 - 5.1 mmol/L   Chloride 105 98 - 111 mmol/L   CO2 19 (L) 22 - 32 mmol/L   Glucose, Bld 98 70 - 99 mg/dL   BUN <5 (L) 6 - 20 mg/dL   Creatinine, Ser 7.84 0.44 - 1.00 mg/dL   Calcium 9.1 8.9 - 69.6 mg/dL   Total Protein 6.4 (L) 6.5 - 8.1 g/dL   Albumin 2.5 (L) 3.5 - 5.0 g/dL   AST 19  15 - 41 U/L   ALT 15 0 - 44 U/L   Alkaline Phosphatase 63 38 - 126 U/L   Total Bilirubin 0.8 0.0 - 1.2 mg/dL   GFR, Estimated >29 >52 mL/min   Anion gap 13 5 - 15  Urinalysis, Routine w reflex microscopic -Urine, Clean Catch     Status: Abnormal   Collection Time: 01/25/24  2:57 PM  Result Value Ref Range   Color, Urine YELLOW YELLOW   APPearance HAZY (A) CLEAR   Specific Gravity, Urine 1.017 1.005 - 1.030   pH 6.0 5.0 - 8.0   Glucose, UA NEGATIVE NEGATIVE mg/dL   Hgb urine dipstick NEGATIVE NEGATIVE   Bilirubin  Urine NEGATIVE NEGATIVE   Ketones, ur NEGATIVE NEGATIVE mg/dL   Protein, ur NEGATIVE NEGATIVE mg/dL   Nitrite NEGATIVE NEGATIVE   Leukocytes,Ua SMALL (A) NEGATIVE   RBC / HPF 0-5 0 - 5 RBC/hpf   WBC, UA 6-10 0 - 5 WBC/hpf   Bacteria, UA RARE (A) NONE SEEN   Squamous Epithelial / HPF 11-20 0 - 5 /HPF   Mucus PRESENT   Protein / creatinine ratio, urine     Status: Abnormal   Collection Time: 01/25/24  2:57 PM  Result Value Ref Range   Creatinine, Urine 128 mg/dL   Total Protein, Urine 23 mg/dL   Protein Creatinine Ratio 0.18 (H) 0.00 - 0.15 mg/mg[Cre]    MDM   Assessment and Plan   1. Braxton Hicks contractions  -Cervix 2.5/50/-3 & unchanged after 2+ hours of monitoring. Infrequent contractions on monitor. She is scheduled for RCS on 3/26.  -Reviewed labor precautions & reasons to return -F/u appt with OB on 3/19  2. Chronic hypertension during pregnancy  -Elevated BPs during MAU stay. None severe range & asymptomatic. Preeclampsia labs normal. Reviewed with Dr. Earlene Plater. Can continue labetalol at current dosage.  -Reviewed preeclampsia precautions & reasons to return  3. Dichorionic diamniotic twin pregnancy in third trimester   4. [redacted] weeks gestation of pregnancy      Judeth Horn 01/25/2024, 1:43 PM

## 2024-01-26 LAB — COMPREHENSIVE METABOLIC PANEL
ALT: 16 IU/L (ref 0–32)
AST: 24 IU/L (ref 0–40)
Albumin: 3.2 g/dL — ABNORMAL LOW (ref 4.0–5.0)
Alkaline Phosphatase: 76 IU/L (ref 44–121)
BUN/Creatinine Ratio: 10 (ref 9–23)
BUN: 4 mg/dL — ABNORMAL LOW (ref 6–20)
Bilirubin Total: 0.5 mg/dL (ref 0.0–1.2)
CO2: 19 mmol/L — ABNORMAL LOW (ref 20–29)
Calcium: 8.8 mg/dL (ref 8.7–10.2)
Chloride: 104 mmol/L (ref 96–106)
Creatinine, Ser: 0.41 mg/dL — ABNORMAL LOW (ref 0.57–1.00)
Globulin, Total: 3.1 g/dL (ref 1.5–4.5)
Glucose: 84 mg/dL (ref 70–99)
Potassium: 3.7 mmol/L (ref 3.5–5.2)
Sodium: 137 mmol/L (ref 134–144)
Total Protein: 6.3 g/dL (ref 6.0–8.5)
eGFR: 138 mL/min/{1.73_m2} (ref >59)

## 2024-01-26 LAB — CBC
Hematocrit: 31.2 % — ABNORMAL LOW (ref 34.0–46.6)
Hemoglobin: 10.2 g/dL — ABNORMAL LOW (ref 11.1–15.9)
MCH: 29.1 pg (ref 26.6–33.0)
MCHC: 32.7 g/dL (ref 31.5–35.7)
MCV: 89 fL (ref 79–97)
Platelets: 160 10*3/uL (ref 150–450)
RBC: 3.5 x10E6/uL — ABNORMAL LOW (ref 3.77–5.28)
RDW: 14.4 % (ref 11.7–15.4)
WBC: 10.2 10*3/uL (ref 3.4–10.8)

## 2024-01-26 LAB — CULTURE, BETA STREP (GROUP B ONLY): Strep Gp B Culture: POSITIVE — AB

## 2024-01-26 LAB — PROTEIN / CREATININE RATIO, URINE
Creatinine, Urine: 123.5 mg/dL
Protein, Ur: 27 mg/dL
Protein/Creat Ratio: 219 mg/g{creat} — ABNORMAL HIGH (ref 0–200)

## 2024-01-28 ENCOUNTER — Ambulatory Visit: Payer: Self-pay | Admitting: Certified Nurse Midwife

## 2024-01-28 ENCOUNTER — Inpatient Hospital Stay (HOSPITAL_COMMUNITY)
Admission: AD | Admit: 2024-01-28 | Discharge: 2024-01-31 | DRG: 783 | Disposition: A | Discharge status: Home / Self Care | Attending: Family Medicine | Admitting: Family Medicine

## 2024-01-28 ENCOUNTER — Other Ambulatory Visit: Payer: Self-pay

## 2024-01-28 ENCOUNTER — Ambulatory Visit (HOSPITAL_BASED_OUTPATIENT_CLINIC_OR_DEPARTMENT_OTHER): Payer: Self-pay | Admitting: Physical Therapy

## 2024-01-28 ENCOUNTER — Inpatient Hospital Stay (HOSPITAL_COMMUNITY): Admitting: Anesthesiology

## 2024-01-28 ENCOUNTER — Encounter (HOSPITAL_COMMUNITY)
Admission: AD | Disposition: A | Discharge status: Home / Self Care | Payer: Self-pay | Source: Home / Self Care | Attending: Obstetrics & Gynecology

## 2024-01-28 ENCOUNTER — Encounter (HOSPITAL_COMMUNITY): Payer: Self-pay | Admitting: Obstetrics and Gynecology

## 2024-01-28 VITALS — BP 165/88 | HR 96 | Wt 388.0 lb

## 2024-01-28 DIAGNOSIS — O3413 Maternal care for benign tumor of corpus uteri, third trimester: Secondary | ICD-10-CM | POA: Diagnosis present

## 2024-01-28 DIAGNOSIS — O10913 Unspecified pre-existing hypertension complicating pregnancy, third trimester: Secondary | ICD-10-CM

## 2024-01-28 DIAGNOSIS — O1092 Unspecified pre-existing hypertension complicating childbirth: Secondary | ICD-10-CM | POA: Diagnosis present

## 2024-01-28 DIAGNOSIS — O30043 Twin pregnancy, dichorionic/diamniotic, third trimester: Secondary | ICD-10-CM

## 2024-01-28 DIAGNOSIS — O99820 Streptococcus B carrier state complicating pregnancy: Secondary | ICD-10-CM

## 2024-01-28 DIAGNOSIS — O411232 Chorioamnionitis, third trimester, fetus 2: Secondary | ICD-10-CM | POA: Diagnosis present

## 2024-01-28 DIAGNOSIS — O99824 Streptococcus B carrier state complicating childbirth: Secondary | ICD-10-CM | POA: Diagnosis present

## 2024-01-28 DIAGNOSIS — Z98891 History of uterine scar from previous surgery: Secondary | ICD-10-CM

## 2024-01-28 DIAGNOSIS — Z833 Family history of diabetes mellitus: Secondary | ICD-10-CM | POA: Diagnosis not present

## 2024-01-28 DIAGNOSIS — O10919 Unspecified pre-existing hypertension complicating pregnancy, unspecified trimester: Secondary | ICD-10-CM | POA: Diagnosis present

## 2024-01-28 DIAGNOSIS — O0990 Supervision of high risk pregnancy, unspecified, unspecified trimester: Secondary | ICD-10-CM

## 2024-01-28 DIAGNOSIS — Z7982 Long term (current) use of aspirin: Secondary | ICD-10-CM | POA: Diagnosis not present

## 2024-01-28 DIAGNOSIS — O114 Pre-existing hypertension with pre-eclampsia, complicating childbirth: Principal | ICD-10-CM | POA: Diagnosis present

## 2024-01-28 DIAGNOSIS — Z79899 Other long term (current) drug therapy: Secondary | ICD-10-CM

## 2024-01-28 DIAGNOSIS — R030 Elevated blood-pressure reading, without diagnosis of hypertension: Secondary | ICD-10-CM | POA: Diagnosis present

## 2024-01-28 DIAGNOSIS — Z3A36 36 weeks gestation of pregnancy: Secondary | ICD-10-CM

## 2024-01-28 DIAGNOSIS — O99214 Obesity complicating childbirth: Secondary | ICD-10-CM | POA: Diagnosis present

## 2024-01-28 DIAGNOSIS — O34211 Maternal care for low transverse scar from previous cesarean delivery: Secondary | ICD-10-CM | POA: Diagnosis present

## 2024-01-28 DIAGNOSIS — O1414 Severe pre-eclampsia complicating childbirth: Secondary | ICD-10-CM

## 2024-01-28 DIAGNOSIS — O1413 Severe pre-eclampsia, third trimester: Secondary | ICD-10-CM | POA: Diagnosis present

## 2024-01-28 DIAGNOSIS — O163 Unspecified maternal hypertension, third trimester: Secondary | ICD-10-CM

## 2024-01-28 DIAGNOSIS — Z87891 Personal history of nicotine dependence: Secondary | ICD-10-CM | POA: Diagnosis not present

## 2024-01-28 DIAGNOSIS — Z302 Encounter for sterilization: Secondary | ICD-10-CM

## 2024-01-28 DIAGNOSIS — D259 Leiomyoma of uterus, unspecified: Secondary | ICD-10-CM | POA: Diagnosis present

## 2024-01-28 DIAGNOSIS — O479 False labor, unspecified: Secondary | ICD-10-CM

## 2024-01-28 DIAGNOSIS — O0993 Supervision of high risk pregnancy, unspecified, third trimester: Secondary | ICD-10-CM

## 2024-01-28 DIAGNOSIS — Z6841 Body Mass Index (BMI) 40.0 and over, adult: Secondary | ICD-10-CM

## 2024-01-28 DIAGNOSIS — Z9851 Tubal ligation status: Secondary | ICD-10-CM

## 2024-01-28 DIAGNOSIS — Z8619 Personal history of other infectious and parasitic diseases: Secondary | ICD-10-CM | POA: Diagnosis present

## 2024-01-28 DIAGNOSIS — I10 Essential (primary) hypertension: Secondary | ICD-10-CM | POA: Diagnosis present

## 2024-01-28 DIAGNOSIS — O26899 Other specified pregnancy related conditions, unspecified trimester: Secondary | ICD-10-CM

## 2024-01-28 DIAGNOSIS — O30049 Twin pregnancy, dichorionic/diamniotic, unspecified trimester: Secondary | ICD-10-CM | POA: Diagnosis present

## 2024-01-28 LAB — PROTEIN / CREATININE RATIO, URINE
Creatinine, Urine: 202 mg/dL
Protein Creatinine Ratio: 0.23 mg/mg{creat} — ABNORMAL HIGH (ref 0.00–0.15)
Total Protein, Urine: 47 mg/dL

## 2024-01-28 LAB — COMPREHENSIVE METABOLIC PANEL
ALT: 14 U/L (ref 0–44)
AST: 18 U/L (ref 15–41)
Albumin: 2.5 g/dL — ABNORMAL LOW (ref 3.5–5.0)
Alkaline Phosphatase: 68 U/L (ref 38–126)
Anion gap: 12 (ref 5–15)
BUN: 6 mg/dL (ref 6–20)
CO2: 17 mmol/L — ABNORMAL LOW (ref 22–32)
Calcium: 8.9 mg/dL (ref 8.9–10.3)
Chloride: 104 mmol/L (ref 98–111)
Creatinine, Ser: 0.47 mg/dL (ref 0.44–1.00)
GFR, Estimated: >60 mL/min (ref >60)
Glucose, Bld: 79 mg/dL (ref 70–99)
Potassium: 3.7 mmol/L (ref 3.5–5.1)
Sodium: 133 mmol/L — ABNORMAL LOW (ref 135–145)
Total Bilirubin: 0.7 mg/dL (ref 0.0–1.2)
Total Protein: 6.6 g/dL (ref 6.5–8.1)

## 2024-01-28 LAB — CBC WITH DIFFERENTIAL/PLATELET
Abs Immature Granulocytes: 0.09 10*3/uL — ABNORMAL HIGH (ref 0.00–0.07)
Basophils Absolute: 0 10*3/uL (ref 0.0–0.1)
Basophils Relative: 0 %
Eosinophils Absolute: 0.1 10*3/uL (ref 0.0–0.5)
Eosinophils Relative: 0 %
HCT: 31.6 % — ABNORMAL LOW (ref 36.0–46.0)
Hemoglobin: 10.5 g/dL — ABNORMAL LOW (ref 12.0–15.0)
Immature Granulocytes: 1 %
Lymphocytes Relative: 13 %
Lymphs Abs: 1.7 10*3/uL (ref 0.7–4.0)
MCH: 29.5 pg (ref 26.0–34.0)
MCHC: 33.2 g/dL (ref 30.0–36.0)
MCV: 88.8 fL (ref 80.0–100.0)
Monocytes Absolute: 1.4 10*3/uL — ABNORMAL HIGH (ref 0.1–1.0)
Monocytes Relative: 11 %
Neutro Abs: 9.5 10*3/uL — ABNORMAL HIGH (ref 1.7–7.7)
Neutrophils Relative %: 75 %
Platelets: 185 10*3/uL (ref 150–400)
RBC: 3.56 MIL/uL — ABNORMAL LOW (ref 3.87–5.11)
RDW: 14 % (ref 11.5–15.5)
WBC: 12.7 10*3/uL — ABNORMAL HIGH (ref 4.0–10.5)
nRBC: 0 % (ref 0.0–0.2)

## 2024-01-28 LAB — URINALYSIS, ROUTINE W REFLEX MICROSCOPIC
Bilirubin Urine: NEGATIVE
Glucose, UA: NEGATIVE mg/dL
Hgb urine dipstick: NEGATIVE
Ketones, ur: NEGATIVE mg/dL
Nitrite: NEGATIVE
Protein, ur: 30 mg/dL — AB
Specific Gravity, Urine: 1.024 (ref 1.005–1.030)
WBC, UA: >50 WBC/hpf (ref 0–5)
pH: 6 (ref 5.0–8.0)

## 2024-01-28 LAB — GLUCOSE, CAPILLARY: Glucose-Capillary: 89 mg/dL (ref 70–99)

## 2024-01-28 LAB — TYPE AND SCREEN
ABO/RH(D): B POS
Antibody Screen: NEGATIVE

## 2024-01-28 SURGERY — Surgical Case
Anesthesia: Spinal | Site: Abdomen

## 2024-01-28 MED ORDER — HYDRALAZINE HCL 20 MG/ML IJ SOLN: 10.0000 mg | INTRAMUSCULAR | Status: DC | PRN

## 2024-01-28 MED ORDER — LABETALOL HCL 5 MG/ML IV SOLN: 80.0000 mg | INTRAVENOUS | Status: DC | PRN

## 2024-01-28 MED ORDER — OXYCODONE HCL 5 MG PO TABS: 5.0000 mg | ORAL_TABLET | Freq: Once | ORAL | Status: DC | PRN

## 2024-01-28 MED ORDER — LACTATED RINGERS IV SOLN
INTRAVENOUS | Status: DC
  Filled 2024-01-28: qty 1000

## 2024-01-28 MED ORDER — MEPERIDINE HCL 25 MG/ML IJ SOLN: 6.2500 mg | INTRAMUSCULAR | Status: DC | PRN

## 2024-01-28 MED ORDER — MAGNESIUM SULFATE 40 GM/1000ML IV SOLN
2.0000 g/h | INTRAVENOUS | Status: DC
  Administered 2024-01-28: 2 g/h via INTRAVENOUS
  Filled 2024-01-28: qty 1000

## 2024-01-28 MED ORDER — NALOXONE HCL 4 MG/10ML IJ SOLN: 1.0000 ug/kg/h | INTRAVENOUS | Status: DC | PRN

## 2024-01-28 MED ORDER — KETOROLAC TROMETHAMINE 30 MG/ML IJ SOLN
INTRAMUSCULAR | Status: AC
  Filled 2024-01-28: qty 1

## 2024-01-28 MED ORDER — SOD CITRATE-CITRIC ACID 500-334 MG/5ML PO SOLN: 30.0000 mL | ORAL | Status: AC

## 2024-01-28 MED ORDER — DIPHENHYDRAMINE HCL 25 MG PO CAPS: 25.0000 mg | ORAL_CAPSULE | Freq: Four times a day (QID) | ORAL | Status: DC | PRN

## 2024-01-28 MED ORDER — MAGNESIUM SULFATE 4 GM/100ML IV SOLN: 4.0000 g | Freq: Once | INTRAVENOUS | Status: DC

## 2024-01-28 MED ORDER — LABETALOL HCL 5 MG/ML IV SOLN: 40.0000 mg | INTRAVENOUS | Status: DC | PRN

## 2024-01-28 MED ORDER — ACETAMINOPHEN 10 MG/ML IV SOLN
INTRAVENOUS | Status: DC | PRN
  Administered 2024-01-28: 1000 mg via INTRAVENOUS

## 2024-01-28 MED ORDER — DEXAMETHASONE SODIUM PHOSPHATE 10 MG/ML IJ SOLN
INTRAMUSCULAR | Status: AC
  Filled 2024-01-28: qty 1

## 2024-01-28 MED ORDER — SCOPOLAMINE 1 MG/3DAYS TD PT72
MEDICATED_PATCH | TRANSDERMAL | Status: AC
  Filled 2024-01-28: qty 1

## 2024-01-28 MED ORDER — FENTANYL CITRATE (PF) 100 MCG/2ML IJ SOLN
INTRAMUSCULAR | Status: AC
  Filled 2024-01-28: qty 2

## 2024-01-28 MED ORDER — CEFAZOLIN IN SODIUM CHLORIDE 3-0.9 GM/100ML-% IV SOLN
3.0000 g | INTRAVENOUS | Status: AC
  Administered 2024-01-28: 3 g via INTRAVENOUS

## 2024-01-28 MED ORDER — SODIUM CHLORIDE 0.9 % IR SOLN
Status: DC | PRN
  Administered 2024-01-28: 1

## 2024-01-28 MED ORDER — ONDANSETRON HCL 4 MG/2ML IJ SOLN
INTRAMUSCULAR | Status: DC | PRN
Start: 2024-01-28 — End: 2024-01-29
  Administered 2024-01-28: 4 mg via INTRAVENOUS

## 2024-01-28 MED ORDER — ONDANSETRON HCL 4 MG/2ML IJ SOLN
INTRAMUSCULAR | Status: AC
  Filled 2024-01-28: qty 2

## 2024-01-28 MED ORDER — ACETAMINOPHEN 500 MG PO TABS
1000.0000 mg | ORAL_TABLET | Freq: Once | ORAL | Status: AC
  Administered 2024-01-28: 1000 mg via ORAL
  Filled 2024-01-28: qty 2

## 2024-01-28 MED ORDER — SOD CITRATE-CITRIC ACID 500-334 MG/5ML PO SOLN
ORAL | Status: AC
  Administered 2024-01-28: 30 mL via ORAL
  Filled 2024-01-28: qty 30

## 2024-01-28 MED ORDER — OXYCODONE HCL 5 MG/5ML PO SOLN: 5.0000 mg | Freq: Once | ORAL | Status: DC | PRN

## 2024-01-28 MED ORDER — LABETALOL HCL 5 MG/ML IV SOLN
20.0000 mg | INTRAVENOUS | Status: DC | PRN
  Administered 2024-01-28: 20 mg via INTRAVENOUS

## 2024-01-28 MED ORDER — AMISULPRIDE (ANTIEMETIC) 5 MG/2ML IV SOLN: 10.0000 mg | Freq: Once | INTRAVENOUS | Status: DC | PRN

## 2024-01-28 MED ORDER — SODIUM CHLORIDE 0.9% FLUSH: 3.0000 mL | INTRAVENOUS | Status: DC | PRN

## 2024-01-28 MED ORDER — EPHEDRINE SULFATE (PRESSORS) 50 MG/ML IJ SOLN
INTRAMUSCULAR | Status: DC | PRN
  Administered 2024-01-28: 10 mg via INTRAVENOUS
  Administered 2024-01-28 (×5): 5 mg via INTRAVENOUS

## 2024-01-28 MED ORDER — PHENYLEPHRINE HCL-NACL 20-0.9 MG/250ML-% IV SOLN
INTRAVENOUS | Status: DC | PRN
  Administered 2024-01-28: 60 ug/min via INTRAVENOUS

## 2024-01-28 MED ORDER — SCOPOLAMINE 1 MG/3DAYS TD PT72
1.0000 | MEDICATED_PATCH | Freq: Once | TRANSDERMAL | Status: DC
  Administered 2024-01-29: 1.5 mg via TRANSDERMAL

## 2024-01-28 MED ORDER — ONDANSETRON HCL 4 MG/2ML IJ SOLN: 4.0000 mg | Freq: Three times a day (TID) | INTRAMUSCULAR | Status: DC | PRN

## 2024-01-28 MED ORDER — MORPHINE SULFATE (PF) 0.5 MG/ML IJ SOLN
INTRAMUSCULAR | Status: AC
  Filled 2024-01-28: qty 10

## 2024-01-28 MED ORDER — STERILE WATER FOR IRRIGATION IR SOLN
Status: DC | PRN
  Administered 2024-01-28: 1000 mL

## 2024-01-28 MED ORDER — FENTANYL CITRATE (PF) 100 MCG/2ML IJ SOLN: 25.0000 ug | INTRAMUSCULAR | Status: DC | PRN

## 2024-01-28 MED ORDER — PHENYLEPHRINE 80 MCG/ML (10ML) SYRINGE FOR IV PUSH (FOR BLOOD PRESSURE SUPPORT)
PREFILLED_SYRINGE | INTRAVENOUS | Status: DC | PRN
  Administered 2024-01-28: 80 ug via INTRAVENOUS

## 2024-01-28 MED ORDER — FENTANYL CITRATE (PF) 100 MCG/2ML IJ SOLN
INTRAMUSCULAR | Status: DC | PRN
  Administered 2024-01-28: 15 ug via INTRATHECAL

## 2024-01-28 MED ORDER — MAGNESIUM SULFATE BOLUS VIA INFUSION
4.0000 g | Freq: Once | INTRAVENOUS | Status: AC
  Administered 2024-01-28: 4 g via INTRAVENOUS
  Filled 2024-01-28: qty 1000

## 2024-01-28 MED ORDER — NALOXONE HCL 0.4 MG/ML IJ SOLN: 0.4000 mg | INTRAMUSCULAR | Status: DC | PRN

## 2024-01-28 MED ORDER — MAGNESIUM SULFATE 40 GM/1000ML IV SOLN: 2.0000 g/h | Freq: Once | INTRAVENOUS | Status: DC

## 2024-01-28 MED ORDER — TRANEXAMIC ACID-NACL 1000-0.7 MG/100ML-% IV SOLN
1000.0000 mg | Freq: Once | INTRAVENOUS | Status: AC
  Administered 2024-01-28: 1000 mg via INTRAVENOUS

## 2024-01-28 MED ORDER — EPHEDRINE 5 MG/ML INJ
INTRAVENOUS | Status: AC
  Filled 2024-01-28: qty 15

## 2024-01-28 MED ORDER — LABETALOL HCL 5 MG/ML IV SOLN
40.0000 mg | INTRAVENOUS | Status: DC | PRN
  Administered 2024-01-28: 40 mg via INTRAVENOUS
  Filled 2024-01-28: qty 8

## 2024-01-28 MED ORDER — KETOROLAC TROMETHAMINE 30 MG/ML IJ SOLN: 30.0000 mg | Freq: Once | INTRAMUSCULAR | Status: AC | PRN

## 2024-01-28 MED ORDER — LABETALOL HCL 5 MG/ML IV SOLN
20.0000 mg | INTRAVENOUS | Status: DC | PRN
  Administered 2024-01-28: 20 mg via INTRAVENOUS
  Filled 2024-01-28 (×2): qty 4

## 2024-01-28 MED ORDER — DEXAMETHASONE SODIUM PHOSPHATE 10 MG/ML IJ SOLN
INTRAMUSCULAR | Status: DC | PRN
  Administered 2024-01-28: 10 mg via INTRAVENOUS

## 2024-01-28 MED ORDER — DIPHENHYDRAMINE HCL 50 MG/ML IJ SOLN: 12.5000 mg | Freq: Four times a day (QID) | INTRAMUSCULAR | Status: DC | PRN

## 2024-01-28 MED ORDER — LACTATED RINGERS IV SOLN: INTRAVENOUS | Status: DC | PRN

## 2024-01-28 MED ORDER — OXYTOCIN-SODIUM CHLORIDE 30-0.9 UT/500ML-% IV SOLN
INTRAVENOUS | Status: DC | PRN
  Administered 2024-01-28: 300 mL via INTRAVENOUS

## 2024-01-28 MED ORDER — MORPHINE SULFATE (PF) 0.5 MG/ML IJ SOLN
INTRAMUSCULAR | Status: DC | PRN
  Administered 2024-01-28: 150 ug via INTRATHECAL

## 2024-01-28 MED ORDER — ACETAMINOPHEN 10 MG/ML IV SOLN: 1000.0000 mg | Freq: Once | INTRAVENOUS | Status: DC | PRN

## 2024-01-28 MED ORDER — CEFAZOLIN IN SODIUM CHLORIDE 3-0.9 GM/100ML-% IV SOLN
INTRAVENOUS | Status: AC
  Filled 2024-01-28: qty 100

## 2024-01-28 SURGICAL SUPPLY — 42 items
BENZOIN TINCTURE PRP APPL 2/3 (GAUZE/BANDAGES/DRESSINGS) ×2 IMPLANT
CANISTER WOUND CARE 500ML ATS (WOUND CARE) ×1 IMPLANT
CHLORAPREP W/TINT 26ML (MISCELLANEOUS) ×4 IMPLANT
CLAMP UMBILICAL CORD (MISCELLANEOUS) ×4 IMPLANT
DERMABOND ADVANCED .7 DNX12 (GAUZE/BANDAGES/DRESSINGS) IMPLANT
DRESSING PREVENA PLUS CUSTOM (GAUZE/BANDAGES/DRESSINGS) ×1 IMPLANT
DRSG OPSITE POSTOP 4X10 (GAUZE/BANDAGES/DRESSINGS) ×2 IMPLANT
DRSG PREVENA PLUS CUSTOM (GAUZE/BANDAGES/DRESSINGS) ×2 IMPLANT
ELECT REM PT RETURN 9FT ADLT (ELECTROSURGICAL) ×2 IMPLANT
ELECTRODE REM PT RTRN 9FT ADLT (ELECTROSURGICAL) ×2 IMPLANT
EXTENDER TRAXI PANNICULUS (MISCELLANEOUS) ×1 IMPLANT
EXTRACTOR VACUUM KIWI (MISCELLANEOUS) IMPLANT
GLOVE BIOGEL PI IND STRL 7.0 (GLOVE) ×6 IMPLANT
GLOVE ECLIPSE 6.5 STRL STRAW (GLOVE) ×2 IMPLANT
GOWN STRL REUS W/TWL LRG LVL3 (GOWN DISPOSABLE) ×4 IMPLANT
HEMOSTAT ARISTA ABSORB 3G PWDR (HEMOSTASIS) ×1 IMPLANT
KIT ABG SYR 3ML LUER SLIP (SYRINGE) IMPLANT
LIGASURE IMPACT 36 18CM CVD LR (INSTRUMENTS) ×1 IMPLANT
NDL HYPO 25X5/8 SAFETYGLIDE (NEEDLE) IMPLANT
NDL SAFETY ECLIPSE 18X1.5 (NEEDLE) ×2 IMPLANT
NEEDLE HYPO 25X5/8 SAFETYGLIDE (NEEDLE) IMPLANT
NS IRRIG 1000ML POUR BTL (IV SOLUTION) ×2 IMPLANT
PACK C SECTION WH (CUSTOM PROCEDURE TRAY) ×2 IMPLANT
PAD ABD 7.5X8 STRL (GAUZE/BANDAGES/DRESSINGS) ×2 IMPLANT
PAD OB MATERNITY 4.3X12.25 (PERSONAL CARE ITEMS) ×2 IMPLANT
RETRACTOR TRAXI PANNICULUS (MISCELLANEOUS) ×1 IMPLANT
RTRCTR C-SECT PINK 25CM LRG (MISCELLANEOUS) ×2 IMPLANT
SPONGE LAP 18X18 RF (DISPOSABLE) ×6 IMPLANT
SPONGE T-LAP 18X18 ~~LOC~~+RFID (SPONGE) ×1 IMPLANT
STRIP CLOSURE SKIN 1/2X4 (GAUZE/BANDAGES/DRESSINGS) ×2 IMPLANT
SUT PLAIN 0 NONE (SUTURE) IMPLANT
SUT PLAIN 2 0 XLH (SUTURE) IMPLANT
SUT VIC AB 0 CT1 27XBRD ANBCTR (SUTURE) ×4 IMPLANT
SUT VIC AB 0 CTX36XBRD ANBCTRL (SUTURE) ×2 IMPLANT
SUT VIC AB 2-0 CT1 TAPERPNT 27 (SUTURE) ×2 IMPLANT
SUT VIC AB 3-0 SH 27XBRD (SUTURE) ×2 IMPLANT
SUT VIC AB 4-0 KS 27 (SUTURE) ×2 IMPLANT
SUTURE PLAIN GUT 2.0 ETHICON (SUTURE) ×2 IMPLANT
SYR 10ML LL (SYRINGE) ×2 IMPLANT
SYR BULB 3OZ (MISCELLANEOUS) ×2 IMPLANT
TOWEL OR 17X24 6PK STRL BLUE (TOWEL DISPOSABLE) ×2 IMPLANT
TRAY FOLEY BAG SILVER LF 14FR (SET/KITS/TRAYS/PACK) IMPLANT

## 2024-01-28 NOTE — Progress Notes (Signed)
 While phlebotomy was attempting to draw labs, unsuccessfully, pt had two severe range blood pressures. Second severe pressure 177/96 taken at 1730. RN placed IV team consult order. IV team was delayed. RN able to obtain IV access. IV labetalol 20mg  IV was administered at 1753. Pt continued having severe range pressures. Meanwhile orders where placed to start magnesium. RN delayed in giving second dose of IV labetalol while assessing, finding both twins on the monitor, and starting magnesium. Second 40mg  IV labetalol dose given at 1838. This dose delayed due to administration of higher priority medications (magnesium). Subsequent blood pressures were below severe range value. Pt tolerating magnesium. Twin A has had a nonreactive strip with moderate variability and no decelerations. Twin B has had a reactive, category I strip. RN will continue to assess magnesium administration and prepare pt for OR.

## 2024-01-28 NOTE — Op Note (Incomplete)
 Cesarean Section Operative Note   Patient: Brandy Brock  Date of Procedure: 01/28/2024  Procedure: Repeat Low Transverse Cesarean and Bilateral Tubal Ligation via Bilateral salpingectomy   Indications: patient declines vag del attempt, elective repeat, previous uterine incision: low transverse and undesired fertility  Pre-operative Diagnosis: repeat cesearean section chronic hypertension with tubal ligation  Post-operative Diagnosis: Same and Bilateral Tubal Sterilization via Bilateral salpingectomy  TOLAC Candidate: No  Surgeon: Surgeons and Role:    Myna Hidalgo, DO - Primary    * Joanne Gavel, MD - Fellow  Assistants: An experienced assistant was required given the standard of surgical care given the complexity of the case.  This assistant was needed for exposure, dissection, suctioning, retraction, instrument exchange, assisting with delivery with administration of fundal pressure, and for overall help during the procedure.   Anesthesia: spinal  Anesthesiologist: Leonides Grills, MD   Antibiotics: Cefazolin 3g   Estimated Blood Loss: 509 ml   Total IV Fluids: 1400 ml  Urine Output:  200 cc OF clear urine  Specimens: bilateral fallopian tubes to pathology  Complications: no complications   Indications: Brandy Brock is a 28 y.o. [id number] with an IUP [redacted]w[redacted]d presenting for unscheduled, urgent cesarean secondary to the indications listed above. Clinical course notable for cHTN during pregnancy. Met cHTN with SIPE today by blood pressures which indicated delivery. Di-di twins.  The risks of cesarean section were discussed with the patient including but were not limited to: bleeding which may require transfusion or reoperation; infection which may require antibiotics; injury to bowel, bladder, ureters or other surrounding organs; injury to the fetus; need for additional procedures including hysterectomy in the event of a life-threatening hemorrhage; placental  abnormalities wth subsequent pregnancies, incisional problems, thromboembolic phenomenon and other postoperative/anesthesia complications.  Patient also desires permanent sterilization.  Other reversible forms of contraception were discussed with patient; she declines all other modalities. Risks of procedure discussed with patient including but not limited to: risk of regret, permanence of method, bleeding, infection, injury to surrounding organs and need for additional procedures.  Failure risk of about 1% with increased risk of ectopic gestation if pregnancy occurs was also discussed with patient.  Also discussed possibility of post-tubal pain syndrome. The patient concurred with the proposed plan, giving informed written consent for the procedures.  Patient NPO x8 hours. Anesthesia and OR aware.  Preoperative prophylactic antibiotics and SCDs ordered on call to the OR.   Findings: Viable infants in cephalic presentation, no nuchal cord present for either babe. [address]    Brandy Brock [[id number]]     Brandy Brock [[id number]]   ,    ZHYQMVH, QION GEXBMW [[id number]]     Brandy Brock [[id number]]   ,    IHKVQQV, ZDGL OVFIEP [[id number]]     Brandy Brock [[id number]]   . Weight    Brandy Brock [[id number]]      Brandy Brock [[id number]]   . Clear amniotic fluid. Normal placentas, three vessel cords. Normal uterus with ~1 cm fibroid anterior right, Normal bilateral fallopian tubes with small adhesions to ovaries bilaterally, Normal bilateral ovaries. Moderate adhesive disease, between fascia, rectus, peritoneum . Very minimal adhesions to uterus.  Procedure Details: A Time Out was held and the above information confirmed. The patient received intravenous antibiotics and had sequential compression devices applied to her lower extremities preoperatively. The patient was taken back to the operative suite where spinal anesthesia was administered.  After induction of anesthesia, the patient  was draped and prepped in the usual sterile manner and placed in a dorsal supine position with a leftward tilt. Traxi, traxi extender, and tape used for visualization of surgical site. A low transverse skin incision was made with scalpel over prior scar and carried down through the subcutaneous tissue to the fascia. Fascial incision was made and extended transversely sharply. The fascia was separated from the underlying rectus tissue sharply, both superiorly and inferiorly, with metz and scalpel. The rectus muscles were separated in the midline sharply and the peritoneum was entered bluntly. An Alexis retractor was placed to aid in visualization of the uterus. A bladder flap was not developed. A low transverse uterine incision was made. The infants were successfully delivered from cephalic presentation, the umbilical cord was clamped after 1 minute. Cord ph was not sent, and cord blood was obtained for evaluation. The placentas were removed Intact and appeared normal. The uterine incision was closed with a single layer running locked suture of 0-Vicryl. {OB Op Note Hemostasis:24876}  Attention was then turned to the fallopian tubes. {Tubal Ligation Procedure Descriptions:24878}.  The abdomen and pelvis were cleared of all clot and debris and the Jon Gills was removed. Hemostasis was confirmed on all surfaces.  The peritoneum was {Peritoneal closure:24877}. The fascia was then closed using 0 Vicryl in a running fashion. The subcutaneous layer was {subcutaneous closure:28599}. The skin was closed with a 4-0 vicryl subcuticular stitch. The patient tolerated the procedure well. Sponge, lap, instrument and needle counts were correct x 2. She was taken to the recovery room in stable condition.  Disposition: {Op note disposition:31782}    Signed: Joanne Gavel, MD OB Fellow, [facility] for [facility], [facility]

## 2024-01-28 NOTE — MAU Note (Addendum)
..  Brandy Brock is a 28 y.o. at [redacted]w[redacted]d here in MAU reporting: irregular contractions which started Sunday 3/16. Rating pain with contraction at a 7/10. Denies leaking of fluid. Endorses +FM.   Had severe range pressures at office visit this afternoon. Reports a HA which started at 1500. Rating pain with HA 6/10. Reports edema in BLE. Denies vision changes, RUQ pain.  Pt is a planned repeat c/s.  Last ate at 1330. Pt has been educated to remain NPO for now.   There were no vitals filed for this visit.   FHT:145 (baby A); 150 ( baby B)  Lab orders placed from triage:  UA

## 2024-01-28 NOTE — MAU Note (Signed)
 RN made attempts to call IV team on three different numbers. All went to voicemail and RN was unable to get in touch with a member of the IV team.

## 2024-01-28 NOTE — Op Note (Addendum)
 Cesarean Section Operative Note   Patient: Brandy Brock  Date of Procedure: 01/28/2024  Procedure: Repeat Low Transverse Cesarean and Bilateral Tubal Ligation via Bilateral salpingectomy   Indications: Prior C-section x2, Di/Di twins, chronic HTN with superimposed preeclampsia, morbid obesity  Pre-operative Diagnosis: as above  Post-operative Diagnosis: Same and Bilateral Tubal Sterilization via Bilateral salpingectomy  TOLAC Candidate: No  Surgeon: Surgeons and Role:    Myna Hidalgo, DO - Primary    * Joanne Gavel, MD - Fellow  Assistants: An experienced assistant was required given the standard of surgical care given the complexity of the case.  This assistant was needed for exposure, dissection, suctioning, retraction, instrument exchange, assisting with delivery with administration of fundal pressure, and for overall help during the procedure.   Anesthesia: spinal  Anesthesiologist: No responsible provider has been recorded for the case.   Antibiotics: Cefazolin 3g   Estimated Blood Loss: 509 ml   Total IV Fluids: 1400 ml  Urine Output:  200 cc OF clear urine  Specimens: bilateral fallopian tubes to pathology  Complications: no complications   Indications: Brandy Brock is a 28 y.o. [id number] with an IUP [redacted]w[redacted]d presenting for unscheduled, urgent cesarean secondary to the indications listed above. Clinical course notable for cHTN during pregnancy. Met cHTN with SIPE today by blood pressures which indicated delivery. Di-di twins.  The risks of cesarean section were discussed with the patient including but were not limited to: bleeding which may require transfusion or reoperation; infection which may require antibiotics; injury to bowel, bladder, ureters or other surrounding organs; injury to the fetus; need for additional procedures including hysterectomy in the event of a life-threatening hemorrhage; placental abnormalities wth subsequent pregnancies,  incisional problems, thromboembolic phenomenon and other postoperative/anesthesia complications.  Patient also desires permanent sterilization.  Other reversible forms of contraception were discussed with patient; she declines all other modalities. Risks of procedure discussed with patient including but not limited to: risk of regret, permanence of method, bleeding, infection, injury to surrounding organs and need for additional procedures.  Failure risk of about 1% with increased risk of ectopic gestation if pregnancy occurs was also discussed with patient.  Also discussed possibility of post-tubal pain syndrome. The patient concurred with the proposed plan, giving informed written consent for the procedures.  Patient NPO x8 hours. Anesthesia and OR aware.  Preoperative prophylactic antibiotics and SCDs ordered on call to the OR.   Findings: Viable infants in cephalic presentation, no nuchal cord present for either babe. Apgars for twin A 7/8/9. Twin B 3/7/8. Clear amniotic fluid. Normal placentas, three vessel cords. Normal uterus with ~1 cm fibroid anterior right, Normal bilateral fallopian tubes with small adhesions to ovaries bilaterally, Normal bilateral ovaries. Moderate adhesive disease, between fascia, rectus, peritoneum . No uterine adhesions.  Some adhesions noted of rectus muscle/fascia with some difficulty obtaining entry through peritoneum.  Procedure Details: A Time Out was held and the above information confirmed. The patient received intravenous antibiotics and had sequential compression devices applied to her lower extremities preoperatively. The patient was taken back to the operative suite where spinal anesthesia was administered. After induction of anesthesia, the patient was draped and prepped in the usual sterile manner and placed in a dorsal supine position with a leftward tilt. Traxi, traxi extender, and tape used for visualization of surgical site. A low transverse skin incision was  made with scalpel over prior scar and carried down through the subcutaneous tissue to the fascia. Fascial incision was made and extended  transversely sharply. The fascia was separated from the underlying rectus tissue sharply using both superiorly and inferiorly, with metz and scalpel. The rectus muscles were separated in the midline sharply and the peritoneum was entered sharply. An Alexis retractor was placed to aid in visualization of the uterus. A bladder flap was not developed. A low transverse uterine incision was made. The infants were successfully delivered from cephalic presentation, the umbilical cords were clamped after 1 minute. Babes handed off to awaiting NICU team. Cord ph was not sent, and cord blood was obtained for evaluation. The placentas were removed Intact and appeared normal. The uterine incision was closed with a single layer running locked suture of 0-Vicryl. Overall, excellent hemostasis was noted.  Attention was then turned to the fallopian tubes. The right Fallopian tube was identified and then traced to it's fimbriae. Small adhesions from tube to ovary taken down with bovie. Using the Ligasure device and taking care to avoid large vascular structures, the right fallopian tube was removed sequentially from the cornua to fimbriae to avoid surrounding structures, with excellent hemostasis noted. Attention was then turned to the left fallopian tube, and after confirmation of identification by tracing the tube out to the fimbriae, the same procedure was then performed on with excellent hemostasis noted.  The abdomen and pelvis were cleared of all clot and debris and the Jon Gills was removed. Hemostasis was confirmed on all surfaces.  Rectus re-approximated with 0-vicryl. The fascia was then closed using 0 Vicryl in a running fashion. The subcutaneous layer was reapproximated with 2-0 plain gut suture. The skin was closed with a 4-0 vicryl subcuticular stitch. Dermabond and prevena applied.  The patient tolerated the procedure well. Sponge, lap, instrument and needle counts were correct x 2. She was taken to the recovery room in stable condition.  Disposition: PACU - hemodynamically stable.    Signed: Joanne Gavel, MD OB Fellow, [facility] for [facility], [facility]

## 2024-01-28 NOTE — H&P (Signed)
 Obstetric Preoperative History and Physical  Brandy Brock is a 28 y.o. [id number] with IUP at [redacted]w[redacted]d of di/id twins presented to MAU due to severe range pressure.  Pt with known chronic HTN now with superimposed preeclampsia.  She does note a mild headache- slight resolution with tylenol.  No LOF, no RUQ pain.  Reports good fetal movement, no bleeding, irregular contractions, no leaking of fluid.    Cesarean Section Indication:  prior C-section x 2, Di/Di twins, cHTN with superimposed preeclampsia  Prenatal Course  Pregnancy complications or risks: Patient Active Problem List   Diagnosis Date Noted   [redacted] weeks gestation of pregnancy 01/28/2024   Pain of round ligament affecting pregnancy, antepartum 10/24/2023   History of pre-eclampsia in prior pregnancy, currently pregnant 09/10/2023   History of 2 cesarean sections 09/10/2023   Obesity affecting pregnancy, antepartum 09/10/2023   Supervision of high-risk pregnancy 07/29/2023   Dichorionic diamniotic twin pregnancy 07/01/2023   Chronic hypertension during pregnancy 09/14/2022   History of syphilis 04/22/2022   BMI 60.0-69.9, adult (HCC) 10/13/2008   She plans to breastfeed She desires permanent sterilization via bilateral salpingectomy  Prenatal labs and studies: ABO, Rh: --/--/PENDING (03/19 1756) Antibody: PENDING (03/19 1756) Rubella: 10.20 (09/27 1107) RPR: Reactive (01/15 0824)  HBsAg: Negative (09/27 1107)  HIV: Non Reactive (01/15 0824)  WGN:FAOZHYQM/-- (03/12 1715) 1 hr Glucola  normal Genetic screening  not completed Anatomy US normal  Prenatal Transfer Tool  Maternal Diabetes: No Genetic Screening:   Maternal Ultrasounds/Referrals: Normal Fetal Ultrasounds or other Referrals:  None Maternal Substance Abuse:  No Significant Maternal Medications:  Meds include: Other: Labetalol 200mg  bid Significant Maternal Lab Results: Group B Strep positive  Past Medical History:  Diagnosis Date   Asthma    childhood    Asthma 10/13/2008   Qualifier: Diagnosis of   By: Quincy Carnes   Have albuterol but has not used in pregnancy           Chlamydia    Chronic hypertension affecting pregnancy    Reports history of HTN but is not on medication at this time  10-02-20 157/89 and 143/89 at [facility] department     [facility]/MFM Guidelines for Antenatal Testing and Sonography                                                         Updated  01/29/2021 with Dr. Noralee Space           INDICATION    Growth U/S    BPP weekly    DELIVERY RECOMMENDATION (GA)                            CHTN    Group I   BP < 14   Contraception management 10/28/2022   Elevated liver function tests 12/11/2020   Elevated in MAU on 1/7 - repeat beginning of February    History of postpartum dehiscence of wound 04/22/2022   Hypertension    LGSIL of cervix of undetermined significance    Nausea/vomiting in pregnancy 10/24/2023   Postpartum care following cesarean delivery 10/28/2022   Pregnancy induced hypertension    Status post repeat low transverse cesarean section 04/30/2021   03/2021 LTCS: arrest of dilation 7cm   Syphilis affecting pregnancy  Vaginal Pap smear, abnormal     Past Surgical History:  Procedure Laterality Date   CESAREAN SECTION N/A 03/17/2021   Procedure: CESAREAN SECTION;  Surgeon: Russell Bing, MD;  Location: [facility] LD ORS;  Service: Obstetrics;  Laterality: N/A;   CESAREAN SECTION N/A 09/15/2022   Procedure: CESAREAN SECTION;  Surgeon: [place] Bing, MD;  Location: [facility] LD ORS;  Service: Obstetrics;  Laterality: N/A;   MULTIPLE EXTRACTIONS WITH ALVEOLOPLASTY N/A 10/11/2016   Procedure: MULTIPLE EXTRACTION WITH ALVEOLOPLASTY;  Surgeon: Ocie Doyne, DDS;  Location: [facility] OR;  Service: Oral Surgery;  Laterality: N/A;    OB History  Gravida Para Term Preterm AB Living  3 2 2  0 0 2  SAB IAB Ectopic Multiple Live Births  0 0 0 0 2    # Outcome Date GA Lbr Len/2nd Weight Sex Type Anes PTL Lv  3 Current           2 Term  09/15/22 [redacted]w[redacted]d  3150 g M CS-LTranv Spinal  LIV  1 Term 03/17/21 [redacted]w[redacted]d  2634 g M CS-LTranv Spinal  LIV     Birth Comments: WNL    Social History   Socioeconomic History   Marital status: Single    Spouse name: Education officer, community Sr   Number of children: Not on file   Years of education: Not on file   Highest education level: Not on file  Occupational History   Not on file  Tobacco Use   Smoking status: Never   Smokeless tobacco: Former  Building services engineer status: Never Used  Substance and Sexual Activity   Alcohol use: Not Currently   Drug use: No   Sexual activity: Not Currently    Birth control/protection: None  Other Topics Concern   Not on file  Social History Narrative   Not on file   Social Drivers of Health   Financial Resource Strain: Not on file  Food Insecurity: Patient Declined (12/28/2023)   Hunger Vital Sign    Worried About Running Out of Food in the Last Year: Patient declined    Ran Out of Food in the Last Year: Patient declined  Transportation Needs: Patient Declined (12/28/2023)   PRAPARE - Administrator, Civil Service (Medical): Patient declined    Lack of Transportation (Non-Medical): Patient declined  Physical Activity: Not on file  Stress: Not on file  Social Connections: Patient Declined (12/28/2023)   Social Connection and Isolation Panel [NHANES]    Frequency of Communication with Friends and Family: Patient declined    Frequency of Social Gatherings with Friends and Family: Patient declined    Attends Religious Services: Patient declined    Database administrator or Organizations: Patient declined    Attends Banker Meetings: Patient declined    Marital Status: Patient declined    Family History  Problem Relation Age of Onset   Healthy Mother    Diabetes Father    Diabetes Brother     Medications Prior to Admission  Medication Sig Dispense Refill Last Dose/Taking   acetaminophen (TYLENOL) 325 MG tablet Take 650 mg by  mouth every 6 (six) hours as needed.   01/28/2024 Morning   albuterol (VENTOLIN HFA) 108 (90 Base) MCG/ACT inhaler Inhale 2 puffs into the lungs every 6 (six) hours as needed for wheezing or shortness of breath. 8 g 2 Past Week   aspirin EC 81 MG tablet Take 2 tablets (162 mg total) by mouth daily. Swallow whole. 30 tablet 12 01/28/2024  Morning   cyclobenzaprine (FLEXERIL) 10 MG tablet Take 1 tablet (10 mg total) by mouth 3 (three) times daily as needed for muscle spasms. 20 tablet 0 Past Week   labetalol (NORMODYNE) 100 MG tablet Take 4 tablets (400 mg total) by mouth 2 (two) times daily. 240 tablet 1 01/28/2024 Morning   Magnesium Oxide -Mg Supplement (MAG-OXIDE) 200 MG TABS Take 2 tablets (400 mg total) by mouth at bedtime. If that amount causes loose stools in the am, switch to 200mg  daily at bedtime. 60 tablet 3 Past Month   omeprazole (PRILOSEC) 40 MG capsule Take 1 capsule (40 mg total) by mouth at bedtime. 30 capsule 3 Past Week   Blood Pressure Monitoring (BLOOD PRESSURE KIT) DEVI 1 each by Does not apply route as needed. 1 each 0    Ferric Maltol (ACCRUFER) 30 MG CAPS Take 1 capsule (30 mg total) by mouth daily. (Patient not taking: Reported on 01/07/2024) 30 capsule 1    ondansetron (ZOFRAN-ODT) 4 MG disintegrating tablet Take 2 tablets (8 mg total) by mouth 2 (two) times daily as needed for nausea or vomiting. 20 tablet 0    scopolamine (TRANSDERM-SCOP) 1 MG/3DAYS Place 1 patch onto the skin every 3 (three) days. (Patient not taking: Reported on 01/28/2024)       Allergies  Allergen Reactions   Peanut-Containing Drug Products Itching and Swelling    Facial itching and swelling.    Review of Systems: Pertinent items noted in HPI and remainder of comprehensive ROS otherwise negative.  Physical Exam: BP (!) 177/96   Pulse 87   Temp 98.4 F (36.9 C) (Oral)   Resp (!) 24   Ht 5\' 7"  (1.702 m)   Wt (!) 176 kg   LMP 05/14/2023 (Approximate)   SpO2 98%   BMI 60.77 kg/m  Twin A: FHR by  Doppler: 140 bpm  Twin B: 145 bpm CONSTITUTIONAL: Well-developed, well-nourished female in no acute distress.  HENT:  Normocephalic, atraumatic NECK: Normal range of motion, supple, no masses SKIN: Skin is warm and dry. No rash noted. Not diaphoretic. No erythema. No pallor. NEUROLGIC: Alert and oriented to person, place, and time.  PSYCHIATRIC: Normal mood and affect. Normal behavior. Normal judgment and thought content. CARDIOVASCULAR: Normal heart rate noted, regular rhythm RESPIRATORY: Effort and breath sounds normal, no problems with respiration noted ABDOMEN: Morbidly obese, gravid Soft, nontender, nondistended, gravid. Well-healed Pfannenstiel incision. PELVIC: Deferred MUSCULOSKELETAL: Normal range of motion. No edema and no tenderness. 2+ distal pulses.  Pertinent Labs/Studies:   Results for orders placed or performed during the hospital encounter of 01/28/24 (from the past 72 hours)  CBC with Differential/Platelet     Status: Abnormal   Collection Time: 01/28/24  5:56 PM  Result Value Ref Range   WBC 12.7 (H) 4.0 - 10.5 K/uL   RBC 3.56 (L) 3.87 - 5.11 MIL/uL   Hemoglobin 10.5 (L) 12.0 - 15.0 g/dL   HCT 69.6 (L) 29.5 - 28.4 %   MCV 88.8 80.0 - 100.0 fL   MCH 29.5 26.0 - 34.0 pg   MCHC 33.2 30.0 - 36.0 g/dL   RDW 13.2 44.0 - 10.2 %   Platelets 185 150 - 400 K/uL   nRBC 0.0 0.0 - 0.2 %   Neutrophils Relative % 75 %   Neutro Abs 9.5 (H) 1.7 - 7.7 K/uL   Lymphocytes Relative 13 %   Lymphs Abs 1.7 0.7 - 4.0 K/uL   Monocytes Relative 11 %   Monocytes Absolute 1.4 (H) 0.1 -  1.0 K/uL   Eosinophils Relative 0 %   Eosinophils Absolute 0.1 0.0 - 0.5 K/uL   Basophils Relative 0 %   Basophils Absolute 0.0 0.0 - 0.1 K/uL   Immature Granulocytes 1 %   Abs Immature Granulocytes 0.09 (H) 0.00 - 0.07 K/uL    Comment: Performed at [facility], [address], [place], [place] [address]  Type and screen [facility]     Status: None (Preliminary result)    Collection Time: 01/28/24  5:56 PM  Result Value Ref Range   ABO/RH(D) PENDING    Antibody Screen PENDING    Sample Expiration      01/31/2024,2359 Performed at [facility], [address] [place], [place] [address]     Assessment and Plan: Brandy Brock is a 28 y.o. G3P2002 at [redacted]w[redacted]d being admitted for scheduled cesarean section. The risks of surgery were discussed with the patient including but were not limited to: bleeding which may require transfusion or reoperation; infection which may require antibiotics; injury to bowel, bladder, ureters or other surrounding organs; injury to the fetus; need for additional procedures including hysterectomy in the event of a life-threatening hemorrhage; formation of adhesions; incisional problems; thromboembolic phenomenon and other postoperative/anesthesia complications. The patient concurred with the proposed plan, giving informed written consent for the procedure. Patient has been NPO since last night she will remain NPO for procedure. Anesthesia and OR aware. Preoperative prophylactic antibiotics and SCDs ordered on call to the OR. Due to recent meal @ 1330, plan to wait 8hr or per anesthesia recommendations  -IV Mag -IV anti-hypertensives per protocol -NPO -Ancef 3g IV -TXA to OR   Katha Hamming, DO Obstetrician & Gynecologist, Biochemist, clinical for [employer], [facility]

## 2024-01-28 NOTE — MAU Provider Note (Addendum)
 Chief Complaint:  Hypertension   HPI   None     Brandy Brock is a 28 y.o. G3P2002 at [redacted]w[redacted]d who presents to maternity admissions after being sent from office s/p severe range BP reading. She reports a headache for the past 2 hours, she has not tried anything for the headache. She denies edema of her hands or face, endorses edema in legs for the past week. She denies epigastric pain or changes in her vision. She endorses contractions since Sunday night. She denies VB or LOF, and endorses good FM of both fetuses.   Pregnancy Course: CHTN, Di-Di twins,   Past Medical History:  Diagnosis Date   Asthma    childhood   Asthma 10/13/2008   Qualifier: Diagnosis of   By: Quincy Carnes   Have albuterol but has not used in pregnancy           Chlamydia    Chronic hypertension affecting pregnancy    Reports history of HTN but is not on medication at this time  10-02-20 157/89 and 143/89 at [facility] department     [facility]/MFM Guidelines for Antenatal Testing and Sonography                                                         Updated  01/29/2021 with Dr. Noralee Space           INDICATION    Growth U/S    BPP weekly    DELIVERY RECOMMENDATION (GA)                            CHTN    Group I   BP < 14   Contraception management 10/28/2022   Elevated liver function tests 12/11/2020   Elevated in MAU on 1/7 - repeat beginning of February    History of postpartum dehiscence of wound 04/22/2022   Hypertension    LGSIL of cervix of undetermined significance    Nausea/vomiting in pregnancy 10/24/2023   Postpartum care following cesarean delivery 10/28/2022   Pregnancy induced hypertension    Status post repeat low transverse cesarean section 04/30/2021   03/2021 LTCS: arrest of dilation 7cm   Syphilis affecting pregnancy    Vaginal Pap smear, abnormal    OB History  Gravida Para Term Preterm AB Living  3 2 2  0 0 2  SAB IAB Ectopic Multiple Live Births  0 0 0 0 2    # Outcome Date GA Lbr Len/2nd  Weight Sex Type Anes PTL Lv  3 Current           2 Term 09/15/22 [redacted]w[redacted]d  3150 g M CS-LTranv Spinal  LIV  1 Term 03/17/21 [redacted]w[redacted]d  2634 g M CS-LTranv Spinal  LIV     Birth Comments: WNL   Past Surgical History:  Procedure Laterality Date   CESAREAN SECTION N/A 03/17/2021   Procedure: CESAREAN SECTION;  Surgeon: [place] Bing, MD;  Location: [facility] LD ORS;  Service: Obstetrics;  Laterality: N/A;   CESAREAN SECTION N/A 09/15/2022   Procedure: CESAREAN SECTION;  Surgeon: [place] Bing, MD;  Location: [facility] LD ORS;  Service: Obstetrics;  Laterality: N/A;   MULTIPLE EXTRACTIONS WITH ALVEOLOPLASTY N/A 10/11/2016   Procedure: MULTIPLE EXTRACTION WITH ALVEOLOPLASTY;  Surgeon: Ocie Doyne, DDS;  Location: [facility] OR;  Service: Oral Surgery;  Laterality: N/A;   Family History  Problem Relation Age of Onset   Healthy Mother    Diabetes Father    Diabetes Brother    Social History   Tobacco Use   Smoking status: Never   Smokeless tobacco: Former  Building services engineer status: Never Used  Substance Use Topics   Alcohol use: Not Currently   Drug use: No   Allergies  Allergen Reactions   Peanut-Containing Drug Products Itching and Swelling    Facial itching and swelling.   Medications Prior to Admission  Medication Sig Dispense Refill Last Dose/Taking   acetaminophen (TYLENOL) 325 MG tablet Take 650 mg by mouth every 6 (six) hours as needed.   01/28/2024 Morning   albuterol (VENTOLIN HFA) 108 (90 Base) MCG/ACT inhaler Inhale 2 puffs into the lungs every 6 (six) hours as needed for wheezing or shortness of breath. 8 g 2 Past Week   aspirin EC 81 MG tablet Take 2 tablets (162 mg total) by mouth daily. Swallow whole. 30 tablet 12 01/28/2024 Morning   cyclobenzaprine (FLEXERIL) 10 MG tablet Take 1 tablet (10 mg total) by mouth 3 (three) times daily as needed for muscle spasms. 20 tablet 0 Past Week   labetalol (NORMODYNE) 100 MG tablet Take 4 tablets (400 mg total) by mouth 2 (two) times daily. 240 tablet 1  01/28/2024 Morning   Magnesium Oxide -Mg Supplement (MAG-OXIDE) 200 MG TABS Take 2 tablets (400 mg total) by mouth at bedtime. If that amount causes loose stools in the am, switch to 200mg  daily at bedtime. 60 tablet 3 Past Month   omeprazole (PRILOSEC) 40 MG capsule Take 1 capsule (40 mg total) by mouth at bedtime. 30 capsule 3 Past Week   Blood Pressure Monitoring (BLOOD PRESSURE KIT) DEVI 1 each by Does not apply route as needed. 1 each 0    Ferric Maltol (ACCRUFER) 30 MG CAPS Take 1 capsule (30 mg total) by mouth daily. (Patient not taking: Reported on 01/07/2024) 30 capsule 1    ondansetron (ZOFRAN-ODT) 4 MG disintegrating tablet Take 2 tablets (8 mg total) by mouth 2 (two) times daily as needed for nausea or vomiting. 20 tablet 0    scopolamine (TRANSDERM-SCOP) 1 MG/3DAYS Place 1 patch onto the skin every 3 (three) days. (Patient not taking: Reported on 01/28/2024)       I have reviewed patient's Past Medical Hx, Surgical Hx, Family Hx, Social Hx, medications and allergies.   ROS  Pertinent items noted in HPI and remainder of comprehensive ROS otherwise negative.   PHYSICAL EXAM  Patient Vitals for the past 24 hrs:  BP Temp Temp src Pulse Resp SpO2 Height Weight  01/28/24 1730 (!) 177/96 -- -- 87 -- 98 % -- --  01/28/24 1715 (!) 169/88 -- -- 92 -- 98 % -- --  01/28/24 1700 -- -- -- -- -- 98 % -- --  01/28/24 1655 -- -- -- -- -- 98 % -- --  01/28/24 1645 (!) 154/89 -- -- 97 -- 99 % -- --  01/28/24 1644 (!) 154/85 98.4 F (36.9 C) Oral 97 (!) 24 -- -- --  01/28/24 1639 -- -- -- -- -- -- 5\' 7"  (1.702 m) (!) 176 kg    Physical Exam Vitals and nursing note reviewed.  Constitutional:      Appearance: Normal appearance. She is obese.  HENT:     Head: Normocephalic and atraumatic.  Cardiovascular:  Rate and Rhythm: Normal rate.     Pulses: Normal pulses.  Pulmonary:     Effort: Pulmonary effort is normal.     Breath sounds: Normal breath sounds.  Abdominal:     Comments: Gravid   Musculoskeletal:     Cervical back: Normal range of motion.     Right lower leg: 2+ Edema present.     Left lower leg: 2+ Edema present.  Skin:    General: Skin is warm and dry.     Capillary Refill: Capillary refill takes less than 2 seconds.  Neurological:     General: No focal deficit present.     Mental Status: She is alert and oriented to person, place, and time.     Deep Tendon Reflexes: Reflexes are normal and symmetric.  Psychiatric:        Mood and Affect: Mood normal.        Behavior: Behavior normal.        Thought Content: Thought content normal.        Judgment: Judgment normal.         Fetal Tracing A: Baseline: 150 Variability: mod Accelerations: 15x15 Decelerations:none  Fetal Tracing B : Baseline: 155  Variability: mod Accelerations: 15x15 Decelerations:none  Toco: 4-6 mins   Labs: Results for orders placed or performed during the hospital encounter of 01/28/24 (from the past 24 hours)  CBC with Differential/Platelet     Status: Abnormal   Collection Time: 01/28/24  5:56 PM  Result Value Ref Range   WBC 12.7 (H) 4.0 - 10.5 K/uL   RBC 3.56 (L) 3.87 - 5.11 MIL/uL   Hemoglobin 10.5 (L) 12.0 - 15.0 g/dL   HCT 95.6 (L) 21.3 - 08.6 %   MCV 88.8 80.0 - 100.0 fL   MCH 29.5 26.0 - 34.0 pg   MCHC 33.2 30.0 - 36.0 g/dL   RDW 57.8 46.9 - 62.9 %   Platelets 185 150 - 400 K/uL   nRBC 0.0 0.0 - 0.2 %   Neutrophils Relative % 75 %   Neutro Abs 9.5 (H) 1.7 - 7.7 K/uL   Lymphocytes Relative 13 %   Lymphs Abs 1.7 0.7 - 4.0 K/uL   Monocytes Relative 11 %   Monocytes Absolute 1.4 (H) 0.1 - 1.0 K/uL   Eosinophils Relative 0 %   Eosinophils Absolute 0.1 0.0 - 0.5 K/uL   Basophils Relative 0 %   Basophils Absolute 0.0 0.0 - 0.1 K/uL   Immature Granulocytes 1 %   Abs Immature Granulocytes 0.09 (H) 0.00 - 0.07 K/uL  Comprehensive metabolic panel     Status: Abnormal   Collection Time: 01/28/24  5:56 PM  Result Value Ref Range   Sodium 133 (L) 135 - 145 mmol/L    Potassium 3.7 3.5 - 5.1 mmol/L   Chloride 104 98 - 111 mmol/L   CO2 17 (L) 22 - 32 mmol/L   Glucose, Bld 79 70 - 99 mg/dL   BUN 6 6 - 20 mg/dL   Creatinine, Ser 5.28 0.44 - 1.00 mg/dL   Calcium 8.9 8.9 - 41.3 mg/dL   Total Protein 6.6 6.5 - 8.1 g/dL   Albumin 2.5 (L) 3.5 - 5.0 g/dL   AST 18 15 - 41 U/L   ALT 14 0 - 44 U/L   Alkaline Phosphatase 68 38 - 126 U/L   Total Bilirubin 0.7 0.0 - 1.2 mg/dL   GFR, Estimated >24 >40 mL/min   Anion gap 12 5 - 15  Type  and screen [facility]     Status: None (Preliminary result)   Collection Time: 01/28/24  5:56 PM  Result Value Ref Range   ABO/RH(D) PENDING    Antibody Screen PENDING    Sample Expiration      01/31/2024,2359 Performed at [facility], [address], [place], [place] [address]     Imaging:  No results found.  MDM & MAU COURSE  MDM: Evaluate for CHTN with superimposed PreE: Collect CBC, CMP and P/C ratio PE overall benign Fetal monitoring and trial of Tylenol for HA pain Blood pressure monitoring Consulted Dr. Charlotta Newton with concern regarding severe range blood pressures, di-di twins and history of CS with desire for repeat with BTL.   MAU Course: Orders Placed This Encounter  Procedures   CBC with Differential/Platelet   Comprehensive metabolic panel   Protein / creatinine ratio, urine   Urinalysis, Routine w reflex microscopic -Urine, Clean Catch   RPR   Diet NPO time specified   Notify physician (specify) Confirmatory reading of BP> 160/110 15 minutes later   Apply Hypertensive Disorders of Pregnancy Care Plan   Measure blood pressure   Strict intake and output   Type and screen [facility]   Insert peripheral IV   Meds ordered this encounter  Medications   acetaminophen (TYLENOL) tablet 1,000 mg   AND Linked Order Group    labetalol (NORMODYNE) injection 20 mg    labetalol (NORMODYNE) injection 40 mg    labetalol (NORMODYNE) injection 80 mg    hydrALAZINE  (APRESOLINE) injection 10 mg   DISCONTD: magnesium sulfate IVPB 4 g 100 mL   DISCONTD: magnesium sulfate 40 grams in SWI 1000 mL OB infusion   lactated ringers infusion   magnesium bolus via infusion 4 g   magnesium sulfate 40 grams in SWI 1000 mL OB infusion    ASSESSMENT   1. Dichorionic diamniotic twin pregnancy in third trimester   2. Supervision of high risk pregnancy in third trimester   3. Pain of round ligament affecting pregnancy, antepartum   4. Hypertension affecting pregnancy in third trimester   5. Uterine contractions    Rule out CHTN with superimposed PreE: Collect CBC, CMP and P/C ratio PE overall benign Fetal monitoring and trial of Tylenol for HA pain Blood pressure monitoring - severe range pressures noted   PLAN  Start magnesium sulfate. Dr. Charlotta Newton recommends admission for delivery via repeat CS with BTL 2/2 severe range blood pressures with di-di gestation at 36 weeks.    Allergies as of 01/28/2024       Reactions   Peanut-containing Drug Products Itching, Swelling   Facial itching and swelling.        Lamont Snowball, MSN, CNM 01/28/2024 6:37 PM  Certified Nurse Midwife, [facility]

## 2024-01-28 NOTE — Anesthesia Procedure Notes (Signed)
 Spinal  Patient location during procedure: OR Start time: 01/28/2024 9:51 PM End time: 01/28/2024 10:01 PM Reason for block: surgical anesthesia Staffing Performed: anesthesiologist  Anesthesiologist: Leonides Grills, MD Performed by: Leonides Grills, MD Authorized by: Leonides Grills, MD   Preanesthetic Checklist Completed: patient identified, IV checked, risks and benefits discussed, surgical consent, monitors and equipment checked, pre-op evaluation and timeout performed Spinal Block Patient position: sitting Prep: DuraPrep Patient monitoring: cardiac monitor, continuous pulse ox and blood pressure Approach: midline Location: L2-3 Injection technique: single-shot Needle Needle type: Pencan  Needle gauge: 24 G Needle length: 9 cm Assessment Sensory level: T10 Events: CSF return Additional Notes Functioning IV was confirmed and monitors were applied. Sterile prep and drape, including hand hygiene and sterile gloves were used. The patient was positioned and the spine was prepped. The skin was anesthetized with lidocaine.  Free flow of clear CSF was obtained on the second attempt prior to injecting local anesthetic into the CSF.  The spinal needle aspirated freely following injection.  The needle was carefully withdrawn.  The patient tolerated the procedure well.

## 2024-01-28 NOTE — Progress Notes (Signed)
   PRENATAL VISIT NOTE  Subjective:  Brandy Brock is a 28 y.o. G3P2002 at [redacted]w[redacted]d being seen today for ongoing prenatal care.  She is currently monitored for the following issues for this high-risk pregnancy and has BMI 60.0-69.9, adult (HCC); History of syphilis; Chronic hypertension during pregnancy; Dichorionic diamniotic twin pregnancy; Supervision of high-risk pregnancy; History of pre-eclampsia in prior pregnancy, currently pregnant; History of 2 cesarean sections; Obesity affecting pregnancy, antepartum; Pain of round ligament affecting pregnancy, antepartum; and [redacted] weeks gestation of pregnancy on their problem list.  Patient reports occasional contractions and pelvic pain .  Contractions: Irregular. Vag. Bleeding: None.  Movement: Present. Denies leaking of fluid.   The following portions of the patient's history were reviewed and updated as appropriate: allergies, current medications, past family history, past medical history, past social history, past surgical history and problem list.   Objective:   Vitals:   01/28/24 1441 01/28/24 1514  BP: (!) 157/65 (!) 165/88  Pulse: 91 96  Weight: (!) 176 kg     Fetal Status: Fetal Heart Rate (bpm): 140/150   Movement: Present     General:  Alert, oriented and cooperative. Patient is in no acute distress.  Skin: Skin is warm and dry. No rash noted.   Cardiovascular: Normal heart rate noted  Respiratory: Normal respiratory effort, no problems with respiration noted  Abdomen: Soft, gravid, appropriate for gestational age.  Pain/Pressure: Present     Pelvic: Cervical exam deferred        Extremities: Normal range of motion.  Edema: None  Mental Status: Normal mood and affect. Normal behavior. Normal judgment and thought content.   Assessment and Plan:  Pregnancy: G3P2002 at [redacted]w[redacted]d 1. Supervision of high risk pregnancy in third trimester -Patient reports feeling tired and is having occasional contractions and constant pelvic pain,  exacerbated by activity. -Reports active movement from both babies   2. [redacted] weeks gestation of pregnancy (Primary) -Routine OB care  3. Dichorionic diamniotic twin pregnancy in third trimester - Being followed by MFM  4. Chronic hypertension during pregnancy -Taking labetalol BID. -BP elevated today -Sent to MAU for evaluation  5. History of 2 cesarean sections -rCS scheduled for next week.  Preterm labor symptoms and general obstetric precautions including but not limited to vaginal bleeding, contractions, leaking of fluid and fetal movement were reviewed in detail with the patient. Please refer to After Visit Summary for other counseling recommendations.     Future Appointments  Date Time Provider Department Center  01/29/2024  9:15 AM [facility] NURSE [facility]  01/29/2024  9:30 AM [facility] US4 [facility]  01/30/2024  8:00 AM Salvadore Oxford [facility]  02/02/2024 11:00 AM [facility] PAT 1 [facility] None  02/04/2024  2:35 PM Bernerd Limbo, CNM [facility]    Elige Ko, Student-MidWife

## 2024-01-28 NOTE — Anesthesia Preprocedure Evaluation (Addendum)
 Anesthesia Evaluation  Patient identified by MRN, date of birth, ID band Patient awake    Reviewed: Allergy & Precautions, NPO status , Patient's Chart, lab work & pertinent test results  Airway Mallampati: II  TM Distance: >3 FB Neck ROM: Full    Dental no notable dental hx.    Pulmonary asthma    Pulmonary exam normal        Cardiovascular hypertension, Pt. on medications and Pt. on home beta blockers Normal cardiovascular exam     Neuro/Psych negative neurological ROS  negative psych ROS   GI/Hepatic negative GI ROS, Neg liver ROS,,,  Endo/Other    Class 4 obesity  Renal/GU negative Renal ROS     Musculoskeletal negative musculoskeletal ROS (+)    Abdominal  (+) + obese  Peds  Hematology  (+) Blood dyscrasia, anemia   Anesthesia Other Findings Repeat cesearean section  Chronic hypertension Desires sterility PRE-E on Mag  Reproductive/Obstetrics                             Anesthesia Physical Anesthesia Plan  ASA: 4  Anesthesia Plan: Spinal   Post-op Pain Management:    Induction:   PONV Risk Score and Plan: 2 and Ondansetron, Dexamethasone and Treatment may vary due to age or medical condition  Airway Management Planned: Natural Airway  Additional Equipment:   Intra-op Plan:   Post-operative Plan:   Informed Consent: I have reviewed the patients History and Physical, chart, labs and discussed the procedure including the risks, benefits and alternatives for the proposed anesthesia with the patient or authorized representative who has indicated his/her understanding and acceptance.     Dental advisory given  Plan Discussed with: Surgeon and CRNA  Anesthesia Plan Comments:        Anesthesia Quick Evaluation

## 2024-01-29 ENCOUNTER — Encounter (HOSPITAL_COMMUNITY): Payer: Self-pay | Admitting: Obstetrics & Gynecology

## 2024-01-29 ENCOUNTER — Ambulatory Visit: Payer: Medicaid Other

## 2024-01-29 ENCOUNTER — Other Ambulatory Visit: Payer: Self-pay

## 2024-01-29 LAB — RPR
RPR Ser Ql: REACTIVE — AB
RPR Titer: 1:1 {titer}

## 2024-01-29 LAB — CBC
HCT: 31.6 % — ABNORMAL LOW (ref 36.0–46.0)
HCT: 29.5 % — ABNORMAL LOW (ref 36.0–46.0)
Hemoglobin: 10.4 g/dL — ABNORMAL LOW (ref 12.0–15.0)
Hemoglobin: 10 g/dL — ABNORMAL LOW (ref 12.0–15.0)
MCH: 29.4 pg (ref 26.0–34.0)
MCH: 29.7 pg (ref 26.0–34.0)
MCHC: 32.9 g/dL (ref 30.0–36.0)
MCHC: 33.9 g/dL (ref 30.0–36.0)
MCV: 89.3 fL (ref 80.0–100.0)
MCV: 87.5 fL (ref 80.0–100.0)
Platelets: 183 10*3/uL (ref 150–400)
Platelets: 172 10*3/uL (ref 150–400)
RBC: 3.54 MIL/uL — ABNORMAL LOW (ref 3.87–5.11)
RBC: 3.37 MIL/uL — ABNORMAL LOW (ref 3.87–5.11)
RDW: 14.1 % (ref 11.5–15.5)
RDW: 14.1 % (ref 11.5–15.5)
WBC: 14.8 10*3/uL — ABNORMAL HIGH (ref 4.0–10.5)
WBC: 12.1 10*3/uL — ABNORMAL HIGH (ref 4.0–10.5)
nRBC: 0 % (ref 0.0–0.2)
nRBC: 0 % (ref 0.0–0.2)

## 2024-01-29 LAB — COMPREHENSIVE METABOLIC PANEL
ALT: 13 U/L (ref 0–44)
AST: 20 U/L (ref 15–41)
Albumin: 2.2 g/dL — ABNORMAL LOW (ref 3.5–5.0)
Alkaline Phosphatase: 59 U/L (ref 38–126)
Anion gap: 9 (ref 5–15)
BUN: 5 mg/dL — ABNORMAL LOW (ref 6–20)
CO2: 22 mmol/L (ref 22–32)
Calcium: 8.4 mg/dL — ABNORMAL LOW (ref 8.9–10.3)
Chloride: 102 mmol/L (ref 98–111)
Creatinine, Ser: 0.51 mg/dL (ref 0.44–1.00)
GFR, Estimated: >60 mL/min (ref >60)
Glucose, Bld: 75 mg/dL (ref 70–99)
Potassium: 3.9 mmol/L (ref 3.5–5.1)
Sodium: 133 mmol/L — ABNORMAL LOW (ref 135–145)
Total Bilirubin: 0.6 mg/dL (ref 0.0–1.2)
Total Protein: 5.9 g/dL — ABNORMAL LOW (ref 6.5–8.1)

## 2024-01-29 LAB — MAGNESIUM: Magnesium: 3.6 mg/dL — ABNORMAL HIGH (ref 1.7–2.4)

## 2024-01-29 MED ORDER — SENNOSIDES-DOCUSATE SODIUM 8.6-50 MG PO TABS
2.0000 | ORAL_TABLET | Freq: Every day | ORAL | Status: DC
  Administered 2024-01-29 – 2024-01-31 (×3): 2 via ORAL
  Filled 2024-01-29 (×3): qty 2

## 2024-01-29 MED ORDER — ZOLPIDEM TARTRATE 5 MG PO TABS: 5.0000 mg | ORAL_TABLET | Freq: Every evening | ORAL | Status: DC | PRN

## 2024-01-29 MED ORDER — SIMETHICONE 80 MG PO CHEW: 80.0000 mg | CHEWABLE_TABLET | ORAL | Status: DC | PRN

## 2024-01-29 MED ORDER — FUROSEMIDE 40 MG PO TABS
40.0000 mg | ORAL_TABLET | Freq: Every day | ORAL | Status: DC
  Administered 2024-01-29 – 2024-01-31 (×3): 40 mg via ORAL
  Filled 2024-01-29 (×3): qty 1

## 2024-01-29 MED ORDER — PRENATAL MULTIVITAMIN CH
1.0000 | ORAL_TABLET | Freq: Every day | ORAL | Status: DC
  Administered 2024-01-29 – 2024-01-31 (×3): 1 via ORAL
  Filled 2024-01-29 (×3): qty 1

## 2024-01-29 MED ORDER — COCONUT OIL OIL
1.0000 | TOPICAL_OIL | Status: DC | PRN
  Administered 2024-01-29: 1 via TOPICAL

## 2024-01-29 MED ORDER — OXYTOCIN-SODIUM CHLORIDE 30-0.9 UT/500ML-% IV SOLN: 2.5000 [IU]/h | INTRAVENOUS | Status: AC

## 2024-01-29 MED ORDER — LACTATED RINGERS IV SOLN: INTRAVENOUS | Status: AC

## 2024-01-29 MED ORDER — NIFEDIPINE ER OSMOTIC RELEASE 60 MG PO TB24
60.0000 mg | ORAL_TABLET | Freq: Two times a day (BID) | ORAL | Status: DC
  Administered 2024-01-29 – 2024-01-31 (×4): 60 mg via ORAL
  Filled 2024-01-29 (×4): qty 1

## 2024-01-29 MED ORDER — ENOXAPARIN SODIUM 100 MG/ML IJ SOSY
90.0000 mg | PREFILLED_SYRINGE | INTRAMUSCULAR | Status: DC
  Administered 2024-01-30: 90 mg via SUBCUTANEOUS
  Filled 2024-01-29 (×3): qty 0.9

## 2024-01-29 MED ORDER — MEASLES, MUMPS & RUBELLA VAC IJ SOLR: 0.5000 mL | Freq: Once | INTRAMUSCULAR | Status: DC

## 2024-01-29 MED ORDER — IBUPROFEN 800 MG PO TABS
800.0000 mg | ORAL_TABLET | Freq: Three times a day (TID) | ORAL | Status: DC
  Administered 2024-01-30 – 2024-01-31 (×4): 800 mg via ORAL
  Filled 2024-01-29 (×4): qty 1

## 2024-01-29 MED ORDER — KETOROLAC TROMETHAMINE 30 MG/ML IJ SOLN
30.0000 mg | Freq: Four times a day (QID) | INTRAMUSCULAR | Status: AC
  Administered 2024-01-29 – 2024-01-30 (×4): 30 mg via INTRAVENOUS
  Filled 2024-01-29 (×4): qty 1

## 2024-01-29 MED ORDER — WITCH HAZEL-GLYCERIN EX PADS: 1.0000 | MEDICATED_PAD | CUTANEOUS | Status: DC | PRN

## 2024-01-29 MED ORDER — ACETAMINOPHEN 500 MG PO TABS
1000.0000 mg | ORAL_TABLET | Freq: Three times a day (TID) | ORAL | Status: DC
  Administered 2024-01-29 – 2024-01-31 (×7): 1000 mg via ORAL
  Filled 2024-01-29 (×7): qty 2

## 2024-01-29 MED ORDER — NIFEDIPINE ER OSMOTIC RELEASE 30 MG PO TB24
30.0000 mg | ORAL_TABLET | Freq: Every day | ORAL | Status: DC
  Administered 2024-01-29: 30 mg via ORAL
  Filled 2024-01-29: qty 1

## 2024-01-29 MED ORDER — DIPHENHYDRAMINE HCL 25 MG PO CAPS: 25.0000 mg | ORAL_CAPSULE | Freq: Four times a day (QID) | ORAL | Status: DC | PRN

## 2024-01-29 MED ORDER — MAGNESIUM SULFATE 40 GM/1000ML IV SOLN
2.0000 g/h | INTRAVENOUS | Status: AC
  Administered 2024-01-29: 2 g/h via INTRAVENOUS
  Filled 2024-01-29: qty 1000

## 2024-01-29 MED ORDER — POTASSIUM CHLORIDE CRYS ER 20 MEQ PO TBCR
20.0000 meq | EXTENDED_RELEASE_TABLET | Freq: Every day | ORAL | Status: DC
  Administered 2024-01-29 – 2024-01-31 (×3): 20 meq via ORAL
  Filled 2024-01-29 (×3): qty 1

## 2024-01-29 MED ORDER — MEDROXYPROGESTERONE ACETATE 150 MG/ML IM SUSP: 150.0000 mg | INTRAMUSCULAR | Status: DC | PRN

## 2024-01-29 MED ORDER — DIBUCAINE (PERIANAL) 1 % EX OINT: 1.0000 | TOPICAL_OINTMENT | CUTANEOUS | Status: DC | PRN

## 2024-01-29 MED ORDER — GABAPENTIN 100 MG PO CAPS
100.0000 mg | ORAL_CAPSULE | Freq: Three times a day (TID) | ORAL | Status: DC
  Administered 2024-01-29 – 2024-01-31 (×7): 100 mg via ORAL
  Filled 2024-01-29 (×7): qty 1

## 2024-01-29 MED ORDER — SIMETHICONE 80 MG PO CHEW
80.0000 mg | CHEWABLE_TABLET | Freq: Three times a day (TID) | ORAL | Status: DC
  Administered 2024-01-29 – 2024-01-31 (×7): 80 mg via ORAL
  Filled 2024-01-29 (×7): qty 1

## 2024-01-29 MED ORDER — OXYCODONE HCL 5 MG PO TABS
5.0000 mg | ORAL_TABLET | ORAL | Status: DC | PRN
  Administered 2024-01-29: 10 mg via ORAL
  Administered 2024-01-29 (×3): 5 mg via ORAL
  Administered 2024-01-30 (×3): 10 mg via ORAL
  Administered 2024-01-30 (×2): 5 mg via ORAL
  Filled 2024-01-29: qty 1
  Filled 2024-01-29 (×4): qty 2
  Filled 2024-01-29: qty 1
  Filled 2024-01-29 (×2): qty 2
  Filled 2024-01-29 (×3): qty 1

## 2024-01-29 MED ORDER — MENTHOL 3 MG MT LOZG: 1.0000 | LOZENGE | OROMUCOSAL | Status: DC | PRN

## 2024-01-29 MED ORDER — BUPIVACAINE IN DEXTROSE 0.75-8.25 % IT SOLN
INTRATHECAL | Status: DC | PRN
  Administered 2024-01-28: 1.6 mL via INTRATHECAL

## 2024-01-29 NOTE — Lactation Note (Signed)
 This note was copied from a baby's chart.  NICU Lactation Consultation Note  Patient Name: Brandy Brock [id number] Date: 01/29/2024 Age:28 hours  Reason for consult: Initial assessment; Infant < 6lbs; NICU baby; Multiple gestation; Other (Comment); Late-preterm 34-36.6wks (cHTN, Pre-E)  SUBJECTIVE Visited with family of 28 89/18 weeks old AGA NICU twin female; baby " [name]" got admitted due to respiratory distress. Ms. Buonomo is a P3 and has some experienced breastfeeding and pumping, her plan is to do both, direct breastfeeding along with pumping and bottle feedings, if possible with just breastmilk, but she's open to try formula if needed. She has already been set up with a DEBP but hasn't had a chance to start pumping, she needed bigger flanges, they were provided. Assisted with the fitting of a pumping band in size "XL"for hands on pumping, she initiated pumping during [facility] consult, praised her for her efforts. Reviewed pumping schedule, pumping log, lactogenesis II, CDC and anticipatory guidelines.   OBJECTIVE Infant data: Mother's Current Feeding Choice: Breast Milk and Donor Milk  O2 Device: Room Air FiO2 (%): 21 %  Infant feeding assessment IDFTS - Readiness: 3   Maternal data: [id number] C-Section, Low Transverse Has patient been taught Hand Expression?: Yes Hand Expression Comments: colostrum noted Significant Breast History:: moderate breast changes during the pregnancy Current breast feeding challenges:: NICU admission Does the patient have breastfeeding experience prior to this delivery?: Yes How long did the patient breastfeed?: 4-5 months with first baby, 2nd one she pumped for 3 months Pumping frequency: initiated pumping at 15 hours post-partum Pumped volume: 0 mL Flange Size: 27 Hands-free pumping top sizes: X-Large Chilton Si) Risk factor for low/delayed milk supply:: prematurity, < 6 lbs, Pre-E, Mag, infant separation  Pump: Hands Free, Personal (Medela hands  wearable pump at home)  ASSESSMENT Infant: Feeding Status: Scheduled 9-12-3-6 Feeding method: Tube/Gavage (Bolus)  Maternal: Milk volume: Normal  INTERVENTIONS/PLAN Interventions: Interventions: Breast feeding basics reviewed; Coconut oil; DEBP; Education; CDC Guidelines for Breast Pump Cleaning; NICU Pumping Log Tools: Pump; Flanges; Coconut oil; Hands-free pumping top Pump Education: Setup, frequency, and cleaning; Milk Storage  Plan: STS whenever possible Massage and hand express both breasts prior/after pumping, coconut oil prior pumping Pump both breasts every 3 hours for 15 minutes, ideally 8 pumping sessions/24 hours  No other support person at this time. All questions and concerns answered, family to contact [facility] services PRN.  Consult Status: NICU follow-up NICU Follow-up type: New admission follow up   [name] [name] 01/29/2024, 2:59 PM

## 2024-01-29 NOTE — Progress Notes (Signed)
 Patient screened out for psychosocial assessment since none of the following apply:  Psychosocial stressors documented in mother or baby's chart  Gestation less than 32 weeks  Code at delivery   Infant with anomalies Please contact the Clinical Social Worker if specific needs arise, by MOB's request, or if MOB scores greater than 9/yes to question 10 on Edinburgh Postpartum Depression Screen.  Celso Sickle, LCSW Clinical Social Worker [facility] Cell#: [phone]

## 2024-01-29 NOTE — Lactation Note (Signed)
 This note was copied from a baby's chart.  NICU Lactation Consultation Note  Patient Name: Drinda Belgard ZOXWR'U Date: 01/29/2024 Age:28 hours  Reason for consult: Initial assessment; Late-preterm 34-36.6wks; Other (Comment); NICU baby; Infant < 6lbs; Multiple gestation (cHTN, Pre-E)  SUBJECTIVE Visited with family of 67 68/33 weeks old AGA NICU twin female; baby "[name]" got admitted due to respiratory distress. Ms. Mcveigh is a P3 and has some experienced breastfeeding and pumping, her plan is to do both, direct breastfeeding along with pumping and bottle feedings, if possible with just breastmilk, but she's open to try formula if needed. She has already been set up with a DEBP but hasn't had a chance to start pumping, she needed bigger flanges, they were provided. Assisted with the fitting of a pumping band in size "XL"for hands on pumping, she initiated pumping during [facility] consult, praised her for her efforts. Reviewed pumping schedule, pumping log, lactogenesis II, CDC and anticipatory guidelines.   OBJECTIVE Infant data: Mother's Current Feeding Choice: Breast Milk and Donor Milk  O2 Device: Room Air  Infant feeding assessment IDFTS - Readiness: 2 IDFTS - Quality: 3 (gagging and gulping with purple nipple; switched to dr brown ultra premie)   Maternal data: [id number] C-Section, Low Transverse Has patient been taught Hand Expression?: Yes Hand Expression Comments: colostrum noted Significant Breast History:: moderate breast changes during the pregnancy Current breast feeding challenges:: NICU admission Does the patient have breastfeeding experience prior to this delivery?: Yes How long did the patient breastfeed?: 4-5 months with first baby, 2nd one she pumped for 3 months Pumping frequency: initiated pumping at 15 hours post-partum Pumped volume: 0 mL Flange Size: 27 Hands-free pumping top sizes: X-Large Chilton Si) Risk factor for low/delayed milk supply:: prematurity, < 6 lbs,  Pre-E, Mag, infant separation  Pump: Hands Free, Personal (Medela hands wearable pump at home)  ASSESSMENT Infant: Feeding Status: Scheduled 8-11-2-5 Feeding method: Bottle; Tube/Gavage (Bolus) Nipple Type: (S) Dr. Irving Burton Ultra Preemie  Maternal: Milk volume: Normal  INTERVENTIONS/PLAN Interventions: Interventions: Breast feeding basics reviewed; Coconut oil; DEBP; Education; [employer] brochure; CDC Guidelines for Breast Pump Cleaning; NICU Pumping Log Tools: Pump; Flanges; Coconut oil; Hands-free pumping top Pump Education: Setup, frequency, and cleaning; Milk Storage  Plan: STS whenever possible Massage and hand express both breasts prior/after pumping, coconut oil prior pumping Pump both breasts every 3 hours for 15 minutes, ideally 8 pumping sessions/24 hours  No other support person at this time. All questions and concerns answered, family to contact [facility] services PRN.  Consult Status: NICU follow-up NICU Follow-up type: New admission follow up   Dashana Guizar [name] 01/29/2024, 2:59 PM

## 2024-01-29 NOTE — Progress Notes (Signed)
 POSTPARTUM PROGRESS NOTE  POD #1  Subjective:  Brandy Brock is a 28 y.o. [id number] s/p repeat C-section and bilateral salpingectomy at [redacted]w[redacted]d. Today she notes she is doing well- feeling a little groggy, but otherwise doing well. Tolerating clears.  She has not yet ambulated.  Denies nausea or vomiting. She has not yet passed flatus, no BM.  Pain is well controlled.  Lochia appropriate Denies fever/chills/chest pain/SOB.  no HA, no blurry vision, noRUQ pain  Objective: Blood pressure (!) 151/92, pulse 78, temperature 98.2 F (36.8 C), temperature source Oral, resp. rate 18, height 5\' 7"  (1.702 m), weight (!) 176 kg, last menstrual period 05/14/2023, SpO2 95%, unknown if currently breastfeeding. UOP: 1050cc/8hr  Physical Exam:  General: alert, cooperative and no distress Chest: no respiratory distress Heart: regular rate and rhythm Abdomen: obese, soft, nontender, BS quiet Uterine Fundus: firm, appropriately tender Incision: clean and dry with provena DVT Evaluation: No calf swelling or tenderness Extremities: 1+ edema Skin: warm, dry  Results for orders placed or performed during the hospital encounter of 01/28/24 (from the past 24 hours)  CBC with Differential/Platelet     Status: Abnormal   Collection Time: 01/28/24  5:56 PM  Result Value Ref Range   WBC 12.7 (H) 4.0 - 10.5 K/uL   RBC 3.56 (L) 3.87 - 5.11 MIL/uL   Hemoglobin 10.5 (L) 12.0 - 15.0 g/dL   HCT 02.7 (L) 25.3 - 66.4 %   MCV 88.8 80.0 - 100.0 fL   MCH 29.5 26.0 - 34.0 pg   MCHC 33.2 30.0 - 36.0 g/dL   RDW 40.3 47.4 - 25.9 %   Platelets 185 150 - 400 K/uL   nRBC 0.0 0.0 - 0.2 %   Neutrophils Relative % 75 %   Neutro Abs 9.5 (H) 1.7 - 7.7 K/uL   Lymphocytes Relative 13 %   Lymphs Abs 1.7 0.7 - 4.0 K/uL   Monocytes Relative 11 %   Monocytes Absolute 1.4 (H) 0.1 - 1.0 K/uL   Eosinophils Relative 0 %   Eosinophils Absolute 0.1 0.0 - 0.5 K/uL   Basophils Relative 0 %   Basophils Absolute 0.0 0.0 - 0.1 K/uL    Immature Granulocytes 1 %   Abs Immature Granulocytes 0.09 (H) 0.00 - 0.07 K/uL  Comprehensive metabolic panel     Status: Abnormal   Collection Time: 01/28/24  5:56 PM  Result Value Ref Range   Sodium 133 (L) 135 - 145 mmol/L   Potassium 3.7 3.5 - 5.1 mmol/L   Chloride 104 98 - 111 mmol/L   CO2 17 (L) 22 - 32 mmol/L   Glucose, Bld 79 70 - 99 mg/dL   BUN 6 6 - 20 mg/dL   Creatinine, Ser 5.63 0.44 - 1.00 mg/dL   Calcium 8.9 8.9 - 87.5 mg/dL   Total Protein 6.6 6.5 - 8.1 g/dL   Albumin 2.5 (L) 3.5 - 5.0 g/dL   AST 18 15 - 41 U/L   ALT 14 0 - 44 U/L   Alkaline Phosphatase 68 38 - 126 U/L   Total Bilirubin 0.7 0.0 - 1.2 mg/dL   GFR, Estimated >64 >33 mL/min   Anion gap 12 5 - 15  Type and screen [facility]     Status: None   Collection Time: 01/28/24  5:56 PM  Result Value Ref Range   ABO/RH(D) B POS    Antibody Screen NEG    Sample Expiration      01/31/2024,2359 Performed at [facility]  [facility], [address] [place], [place] [address]   Protein / creatinine ratio, urine     Status: Abnormal   Collection Time: 01/28/24  6:00 PM  Result Value Ref Range   Creatinine, Urine 202 mg/dL   Total Protein, Urine 47 mg/dL   Protein Creatinine Ratio 0.23 (H) 0.00 - 0.15 mg/mg[Cre]  Urinalysis, Routine w reflex microscopic -Urine, Clean Catch     Status: Abnormal   Collection Time: 01/28/24  6:00 PM  Result Value Ref Range   Color, Urine AMBER (A) YELLOW   APPearance CLOUDY (A) CLEAR   Specific Gravity, Urine 1.024 1.005 - 1.030   pH 6.0 5.0 - 8.0   Glucose, UA NEGATIVE NEGATIVE mg/dL   Hgb urine dipstick NEGATIVE NEGATIVE   Bilirubin Urine NEGATIVE NEGATIVE   Ketones, ur NEGATIVE NEGATIVE mg/dL   Protein, ur 30 (A) NEGATIVE mg/dL   Nitrite NEGATIVE NEGATIVE   Leukocytes,Ua MODERATE (A) NEGATIVE   RBC / HPF 6-10 0 - 5 RBC/hpf   WBC, UA >50 0 - 5 WBC/hpf   Bacteria, UA RARE (A) NONE SEEN   Squamous Epithelial / HPF 6-10 0 - 5 /HPF   Mucus PRESENT   Glucose,  capillary     Status: None   Collection Time: 01/28/24 11:16 PM  Result Value Ref Range   Glucose-Capillary 89 70 - 99 mg/dL  CBC     Status: Abnormal   Collection Time: 01/29/24  4:26 AM  Result Value Ref Range   WBC 14.8 (H) 4.0 - 10.5 K/uL   RBC 3.54 (L) 3.87 - 5.11 MIL/uL   Hemoglobin 10.4 (L) 12.0 - 15.0 g/dL   HCT 41.6 (L) 60.6 - 30.1 %   MCV 89.3 80.0 - 100.0 fL   MCH 29.4 26.0 - 34.0 pg   MCHC 32.9 30.0 - 36.0 g/dL   RDW 60.1 09.3 - 23.5 %   Platelets 183 150 - 400 K/uL   nRBC 0.0 0.0 - 0.2 %    Assessment/Plan: Brandy Brock is a 28 y.o. [id number] s/p repeat LTCS and bilateral salpingectomy at [redacted]w[redacted]d POD#1 complicated by: 1) Chronic HTN with superimposed preE -plan for Mag x 24- to be discontinued this evening -currently on Lasix 20mg  daily -Procardia XL 30mg  daily- will titrate as needed -vitals stable, adequate UOP  2) postpartum care -encourage ambulation today -advance diet as tolerated -Lovenox for DVT prophylaxis  Contraception: salpingectomy   Dispo: Continue routine postpartum care   LOS: 1 day   Myna Hidalgo, DO Attending Obstetrician & Gynecologist, [facility] for [employer], [facility]

## 2024-01-29 NOTE — Discharge Summary (Signed)
 Postpartum Discharge Summary      Patient Name: Brandy Brock DOB: Nov 17, 1995 MRN: [id number]  Date of admission: 01/28/2024 Delivery date:   Lilou, Kneip [[id number]]  01/28/2024    Shulamis, Wenberg [[id number]]  01/28/2024 Delivering provider:    Maryfrances, Portugal [[id number]]  Vernee, Baines [[id number]]  Myna Hidalgo Date of discharge: 01/31/2024  Admitting diagnosis: Preeclampsia, severe, third trimester [O14.13] Intrauterine pregnancy: [redacted]w[redacted]d     Secondary diagnosis:  Principal Problem:   Preeclampsia, severe, third trimester Active Problems:   BMI 60.0-69.9, adult (HCC)   History of syphilis   Chronic hypertension during pregnancy   Dichorionic diamniotic twin pregnancy   Supervision of high-risk pregnancy   History of 2 cesarean sections   Maternal morbid obesity, antepartum (HCC)   [redacted] weeks gestation of pregnancy   Status post repeat low transverse cesarean section   S/P bilateral salpingectomy  Additional problems: None    Discharge diagnosis: Preterm Pregnancy Delivered, CHTN with superimposed preeclampsia, and di-di twins, s/p bilateral salpingectomy, morbid obesity                                               Post partum procedures: None Augmentation: N/A Complications: None  Hospital course: 28 y.o. yo [id number] at [redacted]w[redacted]d was admitted to the hospital 01/28/2024 for new diagnosis of cHTN with superimposed severe PEC. Given history of 2 prior C/S and twins proceeded with repeat cesarean delivery, patient also desired sterilization which was done via bilateral salpingectomy.  Delivery details are as follows:  Membrane Rupture Time/Date:    Kyira, Volkert [[id number]]  10:40 PM    Shirely, Toren [[id number]]  10:42 PM,   Karimah, Winquist [[id number]]  01/28/2024    Kierstynn, Babich [[id number]]  01/28/2024  Delivery Method:   Diana Eves [[id number]]  C-Section, Low Transverse     Hazelynn, Mckenny [[id number]]  C-Section, Low Transverse Operative Delivery:N/A Details of operation can be found in separate operative note. Prevena negative wound incision dressing placed.  Patient received intrapartum and postpartum magnesium sulfate for eclampsia prophylaxis as per protocol. BP control was obtained with Procardia XL 60 mg po bid and she was started on Lasix 40 mg po qd x 5 days, there were no further immediate complications.  Otherwise, patient had a routine postpartum course. She is ambulating, tolerating a regular diet, passing flatus, and urinating well. Patient is discharged home in stable condition on 01/31/2024, and will follow up in the office for BP check in one week.       Newborn Data: Birth date:   Yissel, Habermehl [[id number]]  01/28/2024    Latrise, Bowland [[id number]]  01/28/2024 Birth time:   Annisha, Baar [[id number]]  10:41 PM    Celes, Dedic [[id number]]  10:42 PM Gender:   Amberlynn, Tempesta [[id number]]  Female    Jayline, Kilburg [[id number]]  Female Living status:   Markiyah, Gahm [[id number]]  Living    Janifer, Gieselman [[id number]]  Living Apgars:   Meeka, Cartelli [[id number]]  [address] [[id number]]  3 ,   Jakita, Dutkiewicz [[id number]]  8    Georgette, Helmer [[id number]]  7  Weight:   Remie, Mathison [[id number]]  2660 g    Jazzlin, Clements [[id number]]  2550 g   Patient desires circumcision for her female infant.  Circumcision procedure details discussed, risks and benefits of procedure were also discussed.  These include but are not limited to: Benefits of circumcision in men include reduction in the rates of urinary tract infection (UTI), penile cancer, some sexually transmitted infections, penile inflammatory and retractile disorders, as well as easier hygiene.  Risks include bleeding , infection, injury of glans which may lead to penile  deformity or urinary tract issues, unsatisfactory cosmetic appearance and other potential complications related to the procedure.  It was emphasized that this is an elective procedure.  Patient wants to proceed with circumcision; written informed consent obtained.  Patient was told circumcision will be performed prior to infant's discharge pending approval by the pediatrician team, routine circumcision and post circumcision care ordered for the infant.   Magnesium Sulfate received: Yes: Seizure prophylaxis BMZ received: No Rhophylac:No MMR:No T-DaP:Given prenatally Flu: Yes RSV Vaccine received: No Transfusion:No  Immunizations received: Immunization History  Administered Date(s) Administered   Influenza-Unspecified 09/25/2020   Moderna Sars-Covid-2 Vaccination 12/11/2020, 01/08/2021   Tdap 01/29/2021, 09/16/2022, 12/10/2023    Physical exam  Vitals:   01/30/24 1612 01/30/24 1935 01/31/24 0046 01/31/24 0428  BP: (!) 155/72 (!) 146/76 122/65 (!) 147/76  Pulse: 95 (!) 106 (!) 103 (!) 109  Resp: 18 18 17 16   Temp: 97.7 F (36.5 C) 98.3 F (36.8 C) 98 F (36.7 C) 98.1 F (36.7 C)  TempSrc: Oral Oral Oral Oral  SpO2: 98% 96% 95% 94%  Weight:      Height:       General: alert, cooperative, and no distress Lochia: appropriate Uterine Fundus: firm, nontender Incision: Healing well with no significant drainage, No significant erythema, Dressing is clean, dry, and intact. Prevena in place DVT Evaluation: Negative Homan's sign. 2+ BLE edema noted.  Labs: Lab Results  Component Value Date   WBC 12.1 (H) 01/29/2024   HGB 10.0 (L) 01/29/2024   HCT 29.5 (L) 01/29/2024   MCV 87.5 01/29/2024   PLT 172 01/29/2024      Latest Ref Rng & Units 01/29/2024    9:30 AM  CMP  Glucose 70 - 99 mg/dL 75   BUN 6 - 20 mg/dL 5   Creatinine 4.09 - 8.11 mg/dL 9.14   Sodium 782 - 956 mmol/L 133   Potassium 3.5 - 5.1 mmol/L 3.9   Chloride 98 - 111 mmol/L 102   CO2 22 - 32 mmol/L 22   Calcium  8.9 - 10.3 mg/dL 8.4   Total Protein 6.5 - 8.1 g/dL 5.9   Total Bilirubin 0.0 - 1.2 mg/dL 0.6   Alkaline Phos 38 - 126 U/L 59   AST 15 - 41 U/L 20   ALT 0 - 44 U/L 13    Edinburgh Score:    10/28/2022    2:18 PM  Edinburgh Postnatal Depression Scale Screening Tool  I have been able to laugh and see the funny side of things. 0  I have looked forward with enjoyment to things. 0  I have blamed myself unnecessarily when things went wrong. 0  I have been anxious or worried for no good reason. 2  I have felt scared or panicky for no good reason. 1  Things have been getting on top of me. 0  I have been so unhappy that I have had difficulty sleeping. 0  I have felt sad or miserable. 0  I have been so unhappy that I have been crying.  0  The thought of harming myself has occurred to me. 0  Edinburgh Postnatal Depression Scale Total 3   No data recorded  After visit meds:  Allergies as of 01/31/2024       Reactions   Peanut-containing Drug Products Itching, Swelling   Facial itching and swelling.        Medication List     STOP taking these medications    aspirin EC 81 MG tablet   Blood Pressure Kit Devi   labetalol 100 MG tablet Commonly known as: NORMODYNE   Mag-Oxide 200 MG Tabs Generic drug: Magnesium Oxide -Mg Supplement   omeprazole 40 MG capsule Commonly known as: PRILOSEC   ondansetron 4 MG disintegrating tablet Commonly known as: ZOFRAN-ODT   scopolamine 1 MG/3DAYS Commonly known as: TRANSDERM-SCOP       TAKE these medications    ACCRUFeR 30 MG Caps Generic drug: Ferric Maltol Take 1 capsule (30 mg total) by mouth daily.   acetaminophen 500 MG tablet Commonly known as: TYLENOL Take 2 tablets (1,000 mg total) by mouth every 6 (six) hours as needed for mild pain (pain score 1-3) or headache. What changed:  medication strength how much to take reasons to take this   albuterol 108 (90 Base) MCG/ACT inhaler Commonly known as: VENTOLIN HFA Inhale  2 puffs into the lungs every 6 (six) hours as needed for wheezing or shortness of breath.   cyclobenzaprine 10 MG tablet Commonly known as: FLEXERIL Take 1 tablet (10 mg total) by mouth 3 (three) times daily as needed for muscle spasms.   enoxaparin 100 MG/ML injection Commonly known as: LOVENOX Inject 0.9 mLs (90 mg total) into the skin daily.   furosemide 40 MG tablet Commonly known as: LASIX Take 1 tablet (40 mg total) by mouth daily for 5 days.   gabapentin 100 MG capsule Commonly known as: NEURONTIN Take 1 capsule (100 mg total) by mouth 3 (three) times daily.   ibuprofen 800 MG tablet Commonly known as: ADVIL Take 1 tablet (800 mg total) by mouth 3 (three) times daily with meals as needed for fever, moderate pain (pain score 4-6) or cramping.   NIFEdipine 60 MG 24 hr tablet Commonly known as: ADALAT CC Take 1 tablet (60 mg total) by mouth 2 (two) times daily.   oxyCODONE 5 MG immediate release tablet Commonly known as: Oxy IR/ROXICODONE Take 1 tablet (5 mg total) by mouth every 4 (four) hours as needed for severe pain (pain score 7-10) or breakthrough pain.   potassium chloride SA 20 MEQ tablet Commonly known as: KLOR-CON M Take 2 tablets (40 mEq total) by mouth daily for 5 days.   senna-docusate 8.6-50 MG tablet Commonly known as: Senokot-S Take 2 tablets by mouth at bedtime as needed for mild constipation or moderate constipation.               Discharge Care Instructions  (From admission, onward)           Start     Ordered   01/31/24 0000  Discharge wound care:       Comments: As per discharge handout and nursing instructions   01/31/24 0513             Discharge home in stable condition Infant Feeding: Bottle and Breast Infant Disposition:NICU Discharge instruction: per After Visit Summary and Postpartum booklet. Activity: Advance as tolerated. Pelvic rest for 6 weeks.  Diet: routine diet Future Appointments  Date Time Provider  Department Center  02/05/2024 10:20  AM [facility] NURSE [facility]  03/16/2024 10:35 AM Carlynn Herald, CNM [facility]   Follow up Visit:  Message [facility] to [facility] 3/20 Please schedule this patient for a In person postpartum visit in 6 weeks with the following provider: Any provider. Additional Postpartum F/U: Incision check 1 week and BP check 1 week  High risk pregnancy complicated by:  di-di twins, cHTN Delivery mode:     Jonella, Redditt [[id number]]  C-Section, Low Transverse    Azaliah, Carrero [[id number]]  C-Section, Low Transverse Birth Control:   BTS  , already done   01/31/2024 Jaynie Collins, MD

## 2024-01-29 NOTE — Transfer of Care (Signed)
 Immediate Anesthesia Transfer of Care Note  Patient: Brandy Brock  Procedure(s) Performed: CESAREAN SECTION, MULTIPLE FETUSES, WITH FALLOPIAN TUBE INTERRUPTION (Abdomen)  Patient Location: PACU  Anesthesia Type:Spinal  Level of Consciousness: awake, alert , and oriented  Airway & Oxygen Therapy: Patient Spontanous Breathing  Post-op Assessment: Report given to RN and Post -op Vital signs reviewed and stable  Post vital signs: Reviewed and stable  Last Vitals:  Vitals Value Taken Time  BP 152/90 01/29/24 0000  Temp    Pulse 84 01/29/24 0005  Resp 18 01/29/24 0005  SpO2 94 % 01/29/24 0005  Vitals shown include unfiled device data.  Last Pain:  Vitals:   01/28/24 1713  TempSrc:   PainSc: 6          Complications: No notable events documented.

## 2024-01-29 NOTE — Anesthesia Postprocedure Evaluation (Signed)
 Anesthesia Post Note  Patient: Brandy Brock  Procedure(s) Performed: CESAREAN SECTION, MULTIPLE FETUSES, WITH FALLOPIAN TUBE INTERRUPTION (Abdomen)     Patient location during evaluation: PACU Anesthesia Type: Spinal Level of consciousness: awake Pain management: pain level controlled Vital Signs Assessment: post-procedure vital signs reviewed and stable Respiratory status: spontaneous breathing, nonlabored ventilation and respiratory function stable Cardiovascular status: blood pressure returned to baseline and stable Postop Assessment: no apparent nausea or vomiting Anesthetic complications: no   No notable events documented.  Last Vitals:  Vitals:   01/29/24 0300 01/29/24 0400  BP:  (!) 145/88  Pulse:  76  Resp: 16 16  Temp:  (!) 36.4 C  SpO2:  96%    Last Pain:  Vitals:   01/29/24 0400  TempSrc: Oral  PainSc:    Pain Goal:                   Catheryn Bacon Ciena Sampley

## 2024-01-30 ENCOUNTER — Ambulatory Visit (HOSPITAL_BASED_OUTPATIENT_CLINIC_OR_DEPARTMENT_OTHER): Payer: Self-pay | Admitting: Physical Therapy

## 2024-01-30 ENCOUNTER — Encounter (HOSPITAL_COMMUNITY): Payer: Self-pay | Admitting: Obstetrics & Gynecology

## 2024-01-30 LAB — T.PALLIDUM AB, TOTAL: T Pallidum Abs: REACTIVE — AB

## 2024-01-30 LAB — SURGICAL PATHOLOGY

## 2024-01-30 NOTE — Lactation Note (Signed)
 This note was copied from a baby'Brock chart.  NICU Lactation Consultation Note  Patient Name: Brandy Brock [id number] Date: 01/30/2024 Age:28 hours  Reason for consult: Follow-up assessment; Infant < 5lbs; NICU baby; Multiple gestation; Other (Comment); Late-preterm 34-36.6wks; Maternal discharge (cHTN, Pre-E, tubal ligation)  SUBJECTIVE Visited with family of 1 85/27 weeks old AGA NICU twin female " Brandy Brock"; Brandy Brock is a P3 and reported she'Brock been trying to pump every 3 hours but it'Brock been challenging. She'Brock already getting enough colostrum to collect, praised her for her efforts. She'Brock expecting to be discharged from [facility] tomorrow. Reviewed discharge education, pump settings, and the importance of consistent pumping for the onset of lactogenesis II and the prevention of engorgement. Assisted with labeling her colostrum containers, she'Brock trying to distribute her pumping sessions/output "equally" for both babies.  OBJECTIVE Infant data: Mother'Brock Current Feeding Choice: Breast Milk and Donor Milk  O2 Device: Room Air FiO2 (%): 21 %  Infant feeding assessment IDFTS - Readiness: 3 IDFTS - Quality: 3   Maternal data: [id number] C-Section, Low Transverse Has patient been taught Hand Expression?: Yes Hand Expression Comments: colostrum noted Significant Breast History:: moderate breast changes during the pregnancy Current breast feeding challenges:: NICU admission Does the patient have breastfeeding experience prior to this delivery?: Yes How long did the patient breastfeed?: 4-5 months with first baby, 2nd one she pumped for 3 months Pumping frequency: 3 times/24 hours Pumped volume: 10 mL Flange Size: 27 Hands-free pumping top sizes: X-Large Chilton Si) Risk factor for low/delayed milk supply:: prematurity, < 6 lbs, Pre-E, Mag, infant separation  Pump: Hands Free, Personal (Medela hands wearable pump at home)  ASSESSMENT Infant: Feeding Status: Scheduled 9-12-3-6 Feeding method:  Tube/Gavage (Bolus) Nipple Type: Dr. Levert Feinstein Preemie  Maternal: Milk volume: Normal  INTERVENTIONS/PLAN Interventions: Interventions: Breast feeding basics reviewed; Coconut oil; DEBP; Education Discharge Education: Engorgement and breast care Tools: Pump; Flanges; Coconut oil; Hands-free pumping top Pump Education: Setup, frequency, and cleaning; Milk Storage  Plan: STS whenever possible Take all pump pieces to babies' room after her discharge Pump both breasts every 3 hours for 15 minutes, ideally 8 pumping sessions/24 hours; switch to maintain mode once expressing +20 ml of EBM combined    No other support person at this time. All questions and concerns answered, family to contact [facility] services PRN.  Consult Status: NICU follow-up NICU Follow-up type: Verify absence of engorgement; Verify onset of copious milk   Brandy Brock Brandy Brock 01/30/2024, 2:52 PM

## 2024-01-30 NOTE — Progress Notes (Signed)
 POSTPARTUM PROGRESS NOTE  POD #2  Subjective:  Brandy Brock is a 28 y.o. [id number] s/p repeat C-section and bilateral salpingectomy at [redacted]w[redacted]d. Doing better today.  Pt ambulating and voiding without difficulty.  Notes that "hard spot" on her abdomen has improved.  Denies nausea or vomiting. Tolerating regular diet.  She has not yet passed flatus, no BM.  Pain is well controlled.  Lochia appropriate Denies fever/chills/chest pain/SOB.  no HA, no blurry vision, noRUQ pain  Objective: Blood pressure (!) 142/75, pulse 100, temperature 98 F (36.7 C), temperature source Oral, resp. rate 20, height 5\' 7"  (1.702 m), weight (!) 176 kg, last menstrual period 05/14/2023, SpO2 98%, unknown if currently breastfeeding.  Physical Exam:  General: alert, cooperative and no distress Chest: no respiratory distress, CTAB Heart: regular rate and rhythm Abdomen: obese, soft, nontender, BS quiet.  Prior area of marking with induration or erythema- appears normal Uterine Fundus: firm, appropriately tender Incision: clean and dry with provena DVT Evaluation: No calf swelling or tenderness Extremities: 1+ edema Skin: warm, dry  Results for orders placed or performed during the hospital encounter of 01/28/24 (from the past 24 hours)  Comprehensive metabolic panel     Status: Abnormal   Collection Time: 01/29/24  9:30 AM  Result Value Ref Range   Sodium 133 (L) 135 - 145 mmol/L   Potassium 3.9 3.5 - 5.1 mmol/L   Chloride 102 98 - 111 mmol/L   CO2 22 22 - 32 mmol/L   Glucose, Bld 75 70 - 99 mg/dL   BUN 5 (L) 6 - 20 mg/dL   Creatinine, Ser 1.19 0.44 - 1.00 mg/dL   Calcium 8.4 (L) 8.9 - 10.3 mg/dL   Total Protein 5.9 (L) 6.5 - 8.1 g/dL   Albumin 2.2 (L) 3.5 - 5.0 g/dL   AST 20 15 - 41 U/L   ALT 13 0 - 44 U/L   Alkaline Phosphatase 59 38 - 126 U/L   Total Bilirubin 0.6 0.0 - 1.2 mg/dL   GFR, Estimated >14 >78 mL/min   Anion gap 9 5 - 15  CBC     Status: Abnormal   Collection Time: 01/29/24 12:37 PM   Result Value Ref Range   WBC 12.1 (H) 4.0 - 10.5 K/uL   RBC 3.37 (L) 3.87 - 5.11 MIL/uL   Hemoglobin 10.0 (L) 12.0 - 15.0 g/dL   HCT 29.5 (L) 62.1 - 30.8 %   MCV 87.5 80.0 - 100.0 fL   MCH 29.7 26.0 - 34.0 pg   MCHC 33.9 30.0 - 36.0 g/dL   RDW 65.7 84.6 - 96.2 %   Platelets 172 150 - 400 K/uL   nRBC 0.0 0.0 - 0.2 %  Magnesium     Status: Abnormal   Collection Time: 01/29/24 12:37 PM  Result Value Ref Range   Magnesium 3.6 (H) 1.7 - 2.4 mg/dL    Assessment/Plan: Brandy Brock is a 28 y.o. [phone] s/p repeat LTCS and bilateral salpingectomy at [redacted]w[redacted]d POD#2 complicated by: 1) Chronic HTN with superimposed preE S/p Mag x 24hr -Lasix 20mg  daily -Procardia XL 30mg  daily- BP stable -vitals stable, adequate UOP  2) postpartum care -ambulation as tolerated -doing well with regular diet -Lovenox for DVT prophylaxis  Contraception: salpingectomy   Dispo: Continue routine postpartum care, plan for discharge home on POD #3   LOS: 2 days   Myna Hidalgo, DO Attending Obstetrician & Gynecologist, Faculty Practice Center for [employer], [facility]

## 2024-01-30 NOTE — Lactation Note (Signed)
 This note was copied from a baby's chart.  NICU Lactation Consultation Note  Patient Name: Brandy Brock [id number] Date: 01/30/2024 Age:28 hours  Reason for consult: Follow-up assessment; NICU baby; Late-preterm 34-36.6wks; Infant < 6lbs; Multiple gestation; Other (Comment); Maternal discharge (cHTN, Pre-E, tubal ligation)  SUBJECTIVE Visited with family of 110 17/43 weeks old AGA NICU twin female " Brandy Brock"; Brandy Brock is a P3 and reported she's been trying to pump every 3 hours but it's been challenging. She's already getting enough colostrum to collect, praised her for her efforts. She's expecting to be discharged from [facility] tomorrow. Reviewed discharge education, pump settings, and the importance of consistent pumping for the onset of lactogenesis II and the prevention of engorgement. Assisted with labeling her colostrum containers, she's trying to distribute her pumping sessions/output "equally" for both babies.  OBJECTIVE Infant data: Mother's Current Feeding Choice: Breast Milk and Donor Milk  O2 Device: Room Air  Infant feeding assessment IDFTS - Readiness: 3 (alert and sucking on pacifier, fell asleep once out of bed) IDFTS - Quality: 3   Maternal data: [id number] C-Section, Low Transverse Has patient been taught Hand Expression?: Yes Hand Expression Comments: colostrum noted Significant Breast History:: moderate breast changes during the pregnancy Current breast feeding challenges:: NICU admission Does the patient have breastfeeding experience prior to this delivery?: Yes How long did the patient breastfeed?: 4-5 months with first baby, 2nd one she pumped for 3 months Pumping frequency: 3 times/24 hours Pumped volume: 10 mL Flange Size: 27 Hands-free pumping top sizes: X-Large Chilton Si) Risk factor for low/delayed milk supply:: prematurity, < 6 lbs, Pre-E, Mag, infant separation  Pump: Hands Free, Personal (Medela hands wearable pump at home)  ASSESSMENT Infant: Feeding  Status: Scheduled 8-11-2-5 Feeding method: Tube/Gavage (Bolus) Nipple Type: Dr. Levert Feinstein Preemie  Maternal: Milk volume: Normal  INTERVENTIONS/PLAN Interventions: Interventions: Breast feeding basics reviewed; Coconut oil; DEBP; Education Discharge Education: Engorgement and breast care Tools: Pump; Flanges; Coconut oil; Hands-free pumping top Pump Education: Setup, frequency, and cleaning; Milk Storage  Plan: STS whenever possible Take all pump pieces to babies' room after her discharge Pump both breasts every 3 hours for 15 minutes, ideally 8 pumping sessions/24 hours; switch to maintain mode once expressing +20 ml of EBM combined    No other support person at this time. All questions and concerns answered, family to contact [facility] services PRN.  Consult Status: NICU follow-up NICU Follow-up type: Verify absence of engorgement; Verify onset of copious milk   Brandy Brock 01/30/2024, 2:45 PM

## 2024-01-31 ENCOUNTER — Other Ambulatory Visit (HOSPITAL_COMMUNITY): Payer: Self-pay

## 2024-01-31 DIAGNOSIS — Z9851 Tubal ligation status: Secondary | ICD-10-CM

## 2024-01-31 MED ORDER — NIFEDIPINE ER 60 MG PO TB24
60.0000 mg | ORAL_TABLET | Freq: Two times a day (BID) | ORAL | 2 refills | Status: AC
  Filled 2024-01-31: qty 60, 30d supply, fill #0

## 2024-01-31 MED ORDER — SENNOSIDES-DOCUSATE SODIUM 8.6-50 MG PO TABS
2.0000 | ORAL_TABLET | Freq: Every evening | ORAL | 0 refills | Status: DC | PRN
  Filled 2024-01-31: qty 30, 15d supply, fill #0

## 2024-01-31 MED ORDER — OXYCODONE HCL 5 MG PO TABS
5.0000 mg | ORAL_TABLET | ORAL | 0 refills | Status: DC | PRN
  Filled 2024-01-31: qty 30, 5d supply, fill #0

## 2024-01-31 MED ORDER — ENOXAPARIN SODIUM 100 MG/ML IJ SOSY
90.0000 mg | PREFILLED_SYRINGE | INTRAMUSCULAR | 0 refills | Status: DC
  Filled 2024-01-31: qty 34, 34d supply, fill #0

## 2024-01-31 MED ORDER — POTASSIUM CHLORIDE CRYS ER 20 MEQ PO TBCR
40.0000 meq | EXTENDED_RELEASE_TABLET | Freq: Every day | ORAL | 0 refills | Status: DC
  Filled 2024-01-31: qty 10, 5d supply, fill #0

## 2024-01-31 MED ORDER — ACETAMINOPHEN 500 MG PO TABS
1000.0000 mg | ORAL_TABLET | Freq: Four times a day (QID) | ORAL | 0 refills | Status: AC | PRN
  Filled 2024-01-31: qty 60, 8d supply, fill #0

## 2024-01-31 MED ORDER — GABAPENTIN 100 MG PO CAPS
100.0000 mg | ORAL_CAPSULE | Freq: Three times a day (TID) | ORAL | 0 refills | Status: DC
  Filled 2024-01-31: qty 30, 10d supply, fill #0

## 2024-01-31 MED ORDER — IBUPROFEN 800 MG PO TABS
800.0000 mg | ORAL_TABLET | Freq: Three times a day (TID) | ORAL | 0 refills | Status: DC | PRN
  Filled 2024-01-31: qty 60, 20d supply, fill #0

## 2024-01-31 MED ORDER — ENOXAPARIN (LOVENOX) PATIENT EDUCATION KIT
PACK | Freq: Once | Status: DC
  Filled 2024-01-31: qty 1

## 2024-01-31 MED ORDER — FUROSEMIDE 40 MG PO TABS
40.0000 mg | ORAL_TABLET | Freq: Every day | ORAL | 0 refills | Status: DC
  Filled 2024-01-31: qty 5, 5d supply, fill #0

## 2024-01-31 NOTE — Plan of Care (Signed)
  Problem: Education: Goal: Knowledge of disease or condition will improve Outcome: Completed/Met Goal: Knowledge of the prescribed therapeutic regimen will improve Outcome: Completed/Met   Problem: Fluid Volume: Goal: Peripheral tissue perfusion will improve Outcome: Completed/Met   Problem: Clinical Measurements: Goal: Complications related to disease process, condition or treatment will be avoided or minimized Outcome: Completed/Met   Problem: Education: Goal: Knowledge of General Education information will improve Description: Including pain rating scale, medication(s)/side effects and non-pharmacologic comfort measures Outcome: Completed/Met   Problem: Health Behavior/Discharge Planning: Goal: Ability to manage health-related needs will improve Outcome: Completed/Met   Problem: Clinical Measurements: Goal: Ability to maintain clinical measurements within normal limits will improve Outcome: Completed/Met Goal: Will remain free from infection Outcome: Completed/Met Goal: Diagnostic test results will improve Outcome: Completed/Met Goal: Respiratory complications will improve Outcome: Completed/Met Goal: Cardiovascular complication will be avoided Outcome: Completed/Met   Problem: Activity: Goal: Risk for activity intolerance will decrease Outcome: Completed/Met   Problem: Nutrition: Goal: Adequate nutrition will be maintained Outcome: Completed/Met   Problem: Coping: Goal: Level of anxiety will decrease Outcome: Completed/Met   Problem: Elimination: Goal: Will not experience complications related to bowel motility Outcome: Completed/Met Goal: Will not experience complications related to urinary retention Outcome: Completed/Met   Problem: Pain Managment: Goal: General experience of comfort will improve and/or be controlled Outcome: Completed/Met   Problem: Safety: Goal: Ability to remain free from injury will improve Outcome: Completed/Met   Problem: Skin  Integrity: Goal: Risk for impaired skin integrity will decrease Outcome: Completed/Met   Problem: Education: Goal: Knowledge of the prescribed therapeutic regimen will improve Outcome: Completed/Met Goal: Understanding of sexual limitations or changes related to disease process or condition will improve Outcome: Completed/Met Goal: Individualized Educational Video(s) Outcome: Completed/Met   Problem: Self-Concept: Goal: Communication of feelings regarding changes in body function or appearance will improve Outcome: Completed/Met   Problem: Skin Integrity: Goal: Demonstration of wound healing without infection will improve Outcome: Completed/Met   Problem: Education: Goal: Knowledge of condition will improve Outcome: Completed/Met Goal: Individualized Educational Video(s) Outcome: Completed/Met Goal: Individualized Newborn Educational Video(s) Outcome: Completed/Met   Problem: Activity: Goal: Will verbalize the importance of balancing activity with adequate rest periods Outcome: Completed/Met Goal: Ability to tolerate increased activity will improve Outcome: Completed/Met   Problem: Coping: Goal: Ability to identify and utilize available resources and services will improve Outcome: Completed/Met   Problem: Life Cycle: Goal: Chance of risk for complications during the postpartum period will decrease Outcome: Completed/Met   Problem: Role Relationship: Goal: Ability to demonstrate positive interaction with newborn will improve Outcome: Completed/Met   Problem: Skin Integrity: Goal: Demonstration of wound healing without infection will improve Outcome: Completed/Met

## 2024-01-31 NOTE — Progress Notes (Signed)
 Lovenox injection Instructions given to patient. Patients states understanding.

## 2024-02-02 ENCOUNTER — Ambulatory Visit (HOSPITAL_COMMUNITY): Payer: Self-pay

## 2024-02-02 ENCOUNTER — Encounter (HOSPITAL_COMMUNITY)
Admission: RE | Admit: 2024-02-02 | Discharge: 2024-02-02 | Disposition: A | Discharge status: Home / Self Care | Source: Ambulatory Visit | Attending: Obstetrics and Gynecology | Admitting: Obstetrics and Gynecology

## 2024-02-02 NOTE — Lactation Note (Signed)
 This note was copied from a baby's chart.  NICU Lactation Consultation Note  Patient Name: Brandy Brock [id number] Date: 02/02/2024 Age:28 days  Reason for consult: Follow-up assessment; NICU baby; Early term 28-38.6wks; Infant < 6lbs; Multiple gestation  SUBJECTIVE  LC in to visit with P3 Mom of 37wk AGA twins in the NICU.  Baby A "Brandy Brock" being held by [employer].  Noticed that baby was dressed and swaddled.  Offered to assist with STS, but Mom politely declined.  Talked about the benefits of STS with regards to baby's feeding cues and Mom's milk supply.  Mom is exhausted as she roomed-in last night.  Mom has been using a Medela hands free pump at home, but prefers the [employer] in the room.  Mom states she has [facility] and has an appointment tomorrow to receive her pump.  LC offered her a loaner pump, but Mom said she was fine as she will be getting the pump tomorrow.    Talked about whether Mom was interested in breastfeeding.  Mom said she has large nipples that make it difficult to latch babies.  Nipples are large in diameter and invert in the center.  LC talked about using a nipple shield possibly.  Mom also was told about the OP lactation team at [facility] that could assist her as babies grow and can handle a larger nipple.  Mom seemed interested.  OBJECTIVE Infant data: No data recorded O2 Device: Room Air  Infant feeding assessment IDFTS - Readiness: 1 IDFTS - Quality: 2   Maternal data: [id number] C-Section, Low Transverse Pumping frequency: 8 times per 24 hrs Pumped volume: 45 mL Flange Size: 27 Hands-free pumping top sizes: [place] Guys Chilton Si)  WIC Program: Yes WIC Referral Sent?: No (WIC appt tomorrow 3/25) What county?: [place] Pump: Hands Free, Personal (Medela hands wearable pump at home)  ASSESSMENT Infant:  Feeding Status: Scheduled 8-11-2-5 Feeding method: Bottle; Tube/Gavage (Bolus) Nipple Type: Dr. Levert Feinstein Preemie  Maternal: Milk volume: Normal (low  normal)  INTERVENTIONS/PLAN Interventions: Interventions: Breast feeding basics reviewed; Skin to skin; Breast massage; Hand express; DEBP; Education Tools: Pump; Flanges; Bottle; Hands-free pumping top Pump Education: Setup, frequency, and cleaning; Milk Storage  Plan: 1- STS with babies 2- Pump both breasts 20-30 mins adding breast massage and hand expression 3- ask for lactation assistance if decides to try to breastfeed.  Consult Status: NICU follow-up NICU Follow-up type: Verify DEBP issuance; Verify absence of engorgement; Verify onset of copious milk   Brandy Brock 02/02/2024, 4:00 PM

## 2024-02-04 ENCOUNTER — Encounter (HOSPITAL_COMMUNITY): Admission: RE | Payer: Self-pay | Source: Home / Self Care

## 2024-02-04 ENCOUNTER — Encounter: Admitting: Certified Nurse Midwife

## 2024-02-04 ENCOUNTER — Inpatient Hospital Stay (HOSPITAL_COMMUNITY)
Admission: RE | Admit: 2024-02-04 | Payer: Medicaid Other | Source: Home / Self Care | Admitting: Obstetrics and Gynecology

## 2024-02-04 SURGERY — Surgical Case
Anesthesia: Regional

## 2024-02-05 ENCOUNTER — Other Ambulatory Visit: Payer: Self-pay

## 2024-02-05 ENCOUNTER — Ambulatory Visit: Admitting: Family Medicine

## 2024-02-05 VITALS — BP 168/87 | HR 117 | Ht 67.0 in | Wt 366.0 lb

## 2024-02-05 DIAGNOSIS — Z8759 Personal history of other complications of pregnancy, childbirth and the puerperium: Secondary | ICD-10-CM | POA: Diagnosis not present

## 2024-02-05 DIAGNOSIS — Z5189 Encounter for other specified aftercare: Secondary | ICD-10-CM

## 2024-02-05 DIAGNOSIS — L7682 Other postprocedural complications of skin and subcutaneous tissue: Secondary | ICD-10-CM

## 2024-02-05 DIAGNOSIS — Z0130 Encounter for examination of blood pressure without abnormal findings: Secondary | ICD-10-CM

## 2024-02-05 DIAGNOSIS — R600 Localized edema: Secondary | ICD-10-CM

## 2024-02-05 MED ORDER — FUROSEMIDE 40 MG PO TABS: 40.0000 mg | ORAL_TABLET | Freq: Every day | ORAL | 0 refills | Status: DC

## 2024-02-05 MED ORDER — IBUPROFEN 600 MG PO TABS: 600.0000 mg | ORAL_TABLET | Freq: Four times a day (QID) | ORAL | 0 refills | Status: DC | PRN

## 2024-02-05 MED ORDER — POTASSIUM CHLORIDE CRYS ER 20 MEQ PO TBCR: 40.0000 meq | EXTENDED_RELEASE_TABLET | Freq: Every day | ORAL | 0 refills | Status: DC

## 2024-02-05 MED ORDER — OXYCODONE HCL 5 MG PO TABS: 5.0000 mg | ORAL_TABLET | ORAL | 0 refills | Status: DC | PRN

## 2024-02-05 MED ORDER — ENALAPRIL MALEATE 5 MG PO TABS: 5.0000 mg | ORAL_TABLET | Freq: Every day | ORAL | 0 refills | Status: DC

## 2024-02-05 NOTE — Progress Notes (Signed)
 Patient presents to office today for BP and wound check following repeat c-section on 3/19. She reports doing okay since then but has noticed more bloody drainage the past few days into her wound vac that she is concerned about. C-section pain is manageable with meds. She is taking nifedipine 60mg  BID. Has finished 5 day course of lasix. Edema +4 in office today. No headaches, maybe some dizziness but thinks it could be related to all her meds. BP 156/81 & 168/87. Dr Shawnie Pons ordered Vasotec 5mg  daily, lasix 40mg  x 5 days and Kdur x 5 days for blood pressure management. Provena container 40% full of blood. Provena removed and incision cleaned & dried. 1 inch section of the incision on the left side open and draining small amount of blood.   Patient aware of warning signs & when to call. Dr Shawnie Pons will assess/manage. She will return next week for wound & BP check.   Chase Caller RN BSN 02/05/24

## 2024-02-05 NOTE — Progress Notes (Signed)
   Subjective:    Patient ID: Brandy Brock is a 28 y.o. female presenting with Blood Pressure Check and Wound Check  on 02/05/2024  HPI: Here today for wound check and BP check. Her BP is not optimized, she has 3-4+ LE edema and has a prevena with blood in the tubing. She is POD #8.  Review of Systems  Constitutional:  Negative for chills and fever.  Respiratory:  Negative for shortness of breath.   Cardiovascular:  Positive for leg swelling. Negative for chest pain.  Gastrointestinal:  Negative for abdominal pain, nausea and vomiting.  Genitourinary:  Negative for dysuria.  Skin:  Positive for wound. Negative for rash.      Objective:    BP (!) 168/87   Pulse (!) 117   Ht 5\' 7"  (1.702 m)   Wt (!) 366 lb (166 kg)   LMP 05/14/2023 (Approximate)   Breastfeeding Yes   BMI 57.32 kg/m  Physical Exam Vitals reviewed. Exam conducted with a chaperone present.  Constitutional:      General: She is not in acute distress.    Appearance: She is well-developed.  HENT:     Head: Normocephalic and atraumatic.  Eyes:     General: No scleral icterus. Cardiovascular:     Rate and Rhythm: Normal rate.  Pulmonary:     Effort: Pulmonary effort is normal.  Abdominal:     Palpations: Abdomen is soft.     Comments: At the left edge of the incision there is an opening. Opening probed with sterile swabs and quite deep. Incision packed with iodoform.  Musculoskeletal:     Cervical back: Neck supple.  Skin:    General: Skin is warm and dry.  Neurological:     Mental Status: She is alert and oriented to person, place, and time.         Assessment & Plan:  Blood pressure check - BP remains high. Continue Procardia 60 mg BID. Add Vasotec 5 mg.  Visit for wound check  History of severe pre-eclampsia - Plan: enalapril (VASOTEC) 5 MG tablet, furosemide (LASIX) 40 MG tablet, potassium chloride SA (KLOR-CON M) 20 MEQ tablet  Incisional pain - with opening. Warning signs for infxn  reviewed. Mom and pt. shown how to pack. Refilled pain meds for use with packing. - Plan: ibuprofen (ADVIL) 600 MG tablet  Lower extremity edema - another round of lasix + K.   No follow-ups on file.  Reva Bores, MD 02/05/2024 4:33 PM

## 2024-02-09 ENCOUNTER — Ambulatory Visit (HOSPITAL_COMMUNITY): Payer: Self-pay

## 2024-02-09 NOTE — Lactation Note (Signed)
 This note was copied from a baby's chart.  NICU Lactation Consultation Note  Patient Name: Brandy Brock [id number] Date: 02/09/2024 Age:28 days  Reason for consult: Follow-up assessment; Breastfeeding assistance; Multiple gestation; Early term 43-38.6wks; NICU baby; RN request  SUBJECTIVE  LC in to assist with putting baby A "Brandy Brock" to the breast for the first time.  Baby had been trying at other feedings, but unable to open wide enough to latch onto areola.  Nipple tips are slightly abraded.  Mom wanting to try a nipple shield.  Unfortunately a 24 mm NS is not wide enough.  LC tried about 5 times to apply the NS.  LC also tried to help baby latch onto breast without the shield.  Baby can open widely, but unable to sustain the latch.    Mom to look for 28 mm nipple shield.  LC set up the pump and provided extra 27 mm flanges as Mom forgot a set at home.  Encouraged bilateral pumping 6-8 times per 24 hrs.  OBJECTIVE Infant data: No data recorded O2 Device: Room Air  Infant feeding assessment IDFTS - Readiness: 2 IDFTS - Quality: 3   Maternal data: [id number] C-Section, Low Transverse Pumping frequency: 6 times per 24 hrs Pumped volume: 240 mL Flange Size: 27  WIC Program: Yes WIC Referral Sent?: No (WIC appt tomorrow 3/25) What county?: [place] Pump: Hands Free, Personal (Medela hands wearable pump at home)  ASSESSMENT Infant: Latch: Repeated attempts needed to sustain latch, nipple held in mouth throughout feeding, stimulation needed to elicit sucking reflex. Audible Swallowing: None Type of Nipple: Everted at rest and after stimulation (large diameter nipples, 24 mm NS too small) Comfort (Breast/Nipple): Filling, red/small blisters or bruises, mild/mod discomfort (nipple tips sore and slightly abraded) Hold (Positioning): Full assist, staff holds infant at breast LATCH Score: 4  Feeding Status: Scheduled 8-11-2-5 Feeding method: Bottle; Tube/Gavage (Bolus) Nipple  Type: Dr. Levert Feinstein Preemie  Maternal: Milk volume: Normal  INTERVENTIONS/PLAN Interventions: Interventions: Breast feeding basics reviewed; Assisted with latch; Breast massage; Hand express; Skin to skin; Adjust position; Support pillows; Position options; DEBP; Education Tools: Bottle; 42F feeding tube / Syringe Pump Education: Setup, frequency, and cleaning; Milk Storage Nipple shield size: 24 (needs a larger size nipple shield)  Plan: Consult Status: NICU follow-up NICU Follow-up type: Weekly NICU follow up   Brandy Brock 02/09/2024, 3:22 PM

## 2024-02-10 ENCOUNTER — Other Ambulatory Visit: Payer: Self-pay

## 2024-02-10 ENCOUNTER — Ambulatory Visit (INDEPENDENT_AMBULATORY_CARE_PROVIDER_SITE_OTHER): Admitting: Certified Nurse Midwife

## 2024-02-10 ENCOUNTER — Other Ambulatory Visit: Payer: Self-pay | Admitting: Family Medicine

## 2024-02-10 VITALS — BP 143/81 | HR 98 | Ht 67.0 in | Wt 362.0 lb

## 2024-02-10 DIAGNOSIS — I10 Essential (primary) hypertension: Secondary | ICD-10-CM

## 2024-02-10 DIAGNOSIS — Z5189 Encounter for other specified aftercare: Secondary | ICD-10-CM

## 2024-02-10 DIAGNOSIS — T8130XA Disruption of wound, unspecified, initial encounter: Secondary | ICD-10-CM

## 2024-02-10 MED ORDER — HYDROCHLOROTHIAZIDE 12.5 MG PO TABS: 12.5000 mg | ORAL_TABLET | Freq: Every day | ORAL | 0 refills | Status: DC

## 2024-02-10 NOTE — Progress Notes (Signed)
 Blood Pressure Check Visit  Brandy Brock is here for blood pressure recheck following repeat c-section on 01/28/24. BP today is 153/86. Repeat BP is 143/81. Patient endorses bilateral LL edema. Patient had elevated BP during nurse visit on 02/05/24 where she was prescribed another 5 day course of lasix 40 mg and potassium along with Vasotec 5 mg daily. Per patient, completed 5 day course of lasix and potassium and did not start Vasotec prescription. Patient has been compliant on Nifedipine 60 mg BID. Last dose was this morning.   Patient c-section incision was assessed and managed by Pauletta Browns and Fleet Contras, RN. Dressing supplies sent home with patient.     Patient to start taking hydrochlorothiazide 12.5 mg daily and to continue Nifedipine 60 mg BID. Patient next visit is 02/11/24 with provider.    Quintella Reichert, RN 02/10/2024  [phone]

## 2024-02-10 NOTE — Progress Notes (Signed)
Orders for Home health.

## 2024-02-10 NOTE — Progress Notes (Signed)
 Following removal of blood clots by Dr. Bryn Gulling, wound was rinsed with sterile saline. Wound bed appears pink/red with sanguinous discharge. No warmth or redness to surrounding area. Packed with gauze moistened with sterile saline. Small tail left outside of wound. Covered with ABD pad. Patient to cover dressing for shower tonight and replace only ABD pad. Supplies sent with patient for packing at home if necessary. Will return tomorrow morning, 02/11/24 for visit with Edd Arbour, CNM. Will repack at that time. Appt scheduled for 02/12/24 with Crissie Reese MD for wound vac placement. Patient to bring wound vac from home. Wound RN Dawn or wound vac rep to meet at office for assistance. Plan for home health RN to manage wound vac care and dressing changes twice a week.  Fleet Contras RN 02/10/24

## 2024-02-10 NOTE — Care Management (Signed)
 Case Manager received call from Dr. Shawnie Pons for patient to have wound vac and [facility] RN. CM spoke to patient on phone and she in agreement with plan and also verified demographics and phone number and they are correct in system.  Cm called French Ana with [employer] [phone] with referral and orders placed by Dr. Shawnie Pons and French Ana shared that wound vac will planned to be shipped to patient's home tomorrow late afternoon. CM called Velvet Bathe with [facility] # [phone] with RN referral for 2x a week dressing changes starting in the patient's home on Monday 02/16/23 .  Plan per French Ana with [employer] and Dr. Shawnie Pons is for patient to have wound vac delivered to home late tomorrow. Patient will go back to clinic tomorrow for wound dressing packing and then return on Thursday at 2:35 pm for appointment and bring wound vac/supplies and that will be placed in the doctor's office. French Ana or WOC RN [name] will plan to come to office and meet physician to assist placement per French Ana. Patient verbalized understanding.  Phone number of [facility] agency and CM given to patient.  Gretchen Short RNC-MNN, BSN [facility] [phone]

## 2024-02-10 NOTE — Progress Notes (Signed)
 Called to room by RN to assess wound. Wound open, leaking copious bloody fluid. Called Dr. Bryn Gulling to the room who was able to inspect the wound fully and express a large hematoma (6cmx6cm). Advised her to pack with at least 1-2 rolls of sterile guaze, but did not think antibiotics was necessary. Skin is not reddened, irritated or warm.   RN washed wound out with sterile water and repacked. Wound measured to be 7cm at the deepest on the left lateral edge and the wound is open 5cm across.  Call placed to 2nd attending, Dr. Shawnie Pons who is working to help patient get set up with wound care for a wound vac. Can return to the office for packing changes if needed until we can get wound care set up.  BP also still elevated, pt has not taken vasotec. Wary of taking so many medications. Finished lasix today. Will switch to HZTZ to facilitate continued diuresis.  Edd Arbour, CNM, MSN, IBCLC Certified Nurse Midwife, [facility]

## 2024-02-11 ENCOUNTER — Ambulatory Visit: Admitting: Certified Nurse Midwife

## 2024-02-11 VITALS — Wt 362.7 lb

## 2024-02-11 DIAGNOSIS — T8130XA Disruption of wound, unspecified, initial encounter: Secondary | ICD-10-CM

## 2024-02-11 MED ORDER — DOXYCYCLINE HYCLATE 100 MG PO TABS: 100.0000 mg | ORAL_TABLET | Freq: Two times a day (BID) | ORAL | 0 refills | Status: DC

## 2024-02-11 MED ORDER — OXYCODONE HCL 5 MG PO TABS: 5.0000 mg | ORAL_TABLET | ORAL | 0 refills | Status: DC | PRN

## 2024-02-11 NOTE — Progress Notes (Signed)
 Pt reports mother repacked wound this morning because ABD pad was saturated. C-section incision wound packing removed for assessment. Rinsed with normal saline. Wound bed appears to be pink, granulation tissue. Serosanguinous discharge seen on wound bed. Surrounding area is clean and dry with no redness or warmth. Mild odor, patient expresses concern about infection. Edd Arbour, CNM to bedside to visualize wound prior to repacking and to discuss antibiotics. Wound packed with rolled sterile guaze moistened with normal saline. ABD pad applied. Patient has supplies at home if wound dressing needs to be replaced tonight. Will return tomorrow for wound vac placement. This was brought by patient today and will be stored in office overnight.   Fleet Contras RN  02/11/24

## 2024-02-11 NOTE — Progress Notes (Signed)
 Patient was assessed and managed by nursing staff during this encounter. I have reviewed the chart and agree with the documentation and plan.   Pt reported odor reminiscent of other wound infections. Prescribed doxycycline and refilled her oxycodone. Has a wound vac appointment tomorrow.   Edd Arbour, MSN, CNM, [facility] 02/11/24 5:09 PM

## 2024-02-12 ENCOUNTER — Other Ambulatory Visit: Payer: Self-pay

## 2024-02-12 ENCOUNTER — Ambulatory Visit (INDEPENDENT_AMBULATORY_CARE_PROVIDER_SITE_OTHER): Admitting: Family Medicine

## 2024-02-12 VITALS — BP 101/74 | HR 96 | Wt 357.4 lb

## 2024-02-12 DIAGNOSIS — O900 Disruption of cesarean delivery wound: Secondary | ICD-10-CM

## 2024-02-12 DIAGNOSIS — T8131XA Disruption of external operation (surgical) wound, not elsewhere classified, initial encounter: Secondary | ICD-10-CM

## 2024-02-12 NOTE — Progress Notes (Unsigned)
 GYNECOLOGY OFFICE VISIT NOTE  History:   Brandy Brock is a 28 y.o. [phone] here today for wound vac placement.  Patient is s/p RCS+BTL at 36 weeks of di/di twins on 01/28/2024 Course has been complicated by postop incision hematoma with significant tunneling Initially noted on 02/10/2024 when she came for wound check, ultimately seen by Dr. Darra Lis who expressed copious amount of material from hematoma Plan then made for patient to return today for wound vac placement  Patient reports she took pain pill prior to coming to visit, as instructed Still having discharge from the wound Has some packing in place currently  Health Maintenance Due  Topic Date Due   Pneumococcal Vaccine 70-65 Years old (1 of 2 - PCV) Never done   COVID-19 Vaccine (3 - 2024-25 season) 07/13/2023    Past Medical History:  Diagnosis Date   Asthma    childhood   Asthma 10/13/2008   Qualifier: Diagnosis of   By: Quincy Carnes   Have albuterol but has not used in pregnancy           Chlamydia    Chronic hypertension affecting pregnancy    Reports history of HTN but is not on medication at this time  10-02-20 157/89 and 143/89 at [facility] department     [facility] Guidelines for Antenatal Testing and Sonography                                                         Updated  01/29/2021 with Dr. Noralee Space           INDICATION    Growth U/S    BPP weekly    DELIVERY RECOMMENDATION (GA)                            CHTN    Group I   BP < 14   Contraception management 10/28/2022   Elevated liver function tests 12/11/2020   Elevated in MAU on 1/7 - repeat beginning of February    History of postpartum dehiscence of wound 04/22/2022   Hypertension    LGSIL of cervix of undetermined significance    Nausea/vomiting in pregnancy 10/24/2023   Postpartum care following cesarean delivery 10/28/2022   Pregnancy induced hypertension    Status post repeat low transverse cesarean section 04/30/2021   03/2021 LTCS: arrest  of dilation 7cm   Syphilis affecting pregnancy    Vaginal Pap smear, abnormal     Past Surgical History:  Procedure Laterality Date   CESAREAN SECTION N/A 03/17/2021   Procedure: CESAREAN SECTION;  Surgeon: [place] Bing, MD;  Location: [facility];  Service: Obstetrics;  Laterality: N/A;   CESAREAN SECTION N/A 09/15/2022   Procedure: CESAREAN SECTION;  Surgeon: [place] Bing, MD;  Location: [facility];  Service: Obstetrics;  Laterality: N/A;   CESAREAN SECTION MULTI-GESTATIONAL WITH TUBAL N/A 01/28/2024   Procedure: CESAREAN SECTION, MULTIPLE FETUSES, WITH FALLOPIAN TUBE INTERRUPTION;  Surgeon: Myna Hidalgo, DO;  Location: [facility];  Service: Obstetrics;  Laterality: N/A;   MULTIPLE EXTRACTIONS WITH ALVEOLOPLASTY N/A 10/11/2016   Procedure: MULTIPLE EXTRACTION WITH ALVEOLOPLASTY;  Surgeon: Ocie Doyne, DDS;  Location: [facility] OR;  Service: Oral Surgery;  Laterality: N/A;    The following portions of the patient's history  were reviewed and updated as appropriate: allergies, current medications, past family history, past medical history, past social history, past surgical history and problem list.   Health Maintenance:   Last pap: Result Date Procedure Results Follow-ups  04/22/2022 Cytology - PAP( [facility]) Neisseria Gonorrhea: Negative Chlamydia: Negative Adequacy: Satisfactory for evaluation; transformation zone component PRESENT. Diagnosis: - Negative for intraepithelial lesion or malignancy (NILM) Comment: Normal Reference Ranger Chlamydia - Negative Comment: Normal Reference Range Neisseria Gonorrhea - Negative   01/10/2020 Cytology - PAP Pap Smear: LSIL     Last mammogram:  N/a    Review of Systems:  Pertinent items noted in HPI and remainder of comprehensive ROS otherwise negative.  Physical Exam:  BP 101/74   Pulse 96   Wt (!) 357 lb 7 oz (162.1 kg)   LMP 05/14/2023 (Approximate)   BMI 55.98 kg/m  CONSTITUTIONAL: Well-developed, well-nourished female in no acute  distress.  HEENT:  Normocephalic, atraumatic. External right and left ear normal. No scleral icterus.  NECK: Normal range of motion, supple, no masses noted on observation SKIN: No rash noted. Not diaphoretic. No erythema. No pallor. MUSCULOSKELETAL: Normal range of motion. No edema noted. NEUROLOGIC: Alert and oriented to person, place, and time. Normal muscle tone coordination.  PSYCHIATRIC: Normal mood and affect. Normal behavior. Normal judgment and thought content. RESPIRATORY: Effort normal, no problems with respiration noted ABDOMEN: see diagram PELVIC:  not indicated  Physical Exam Abdominal:       Comments: Red: extent of skin dehisence, approximately 5 cm Blue: extent of subcutaneous defect, probed at deepest point to about 7-8 cm      Labs and Imaging No results found for this or any previous visit (from the past week). [place] MFM FETAL BPP WO NON STRESS Result Date: 01/22/2024 ----------------------------------------------------------------------  OBSTETRICS REPORT                       (Signed Final 01/22/2024 10:24 am) ---------------------------------------------------------------------- Patient Info  ID #:       [id number]                          D.O.B.:  [birth year] (27 yrs)(F)  Name:       Brandy Brock                Visit Date: 01/22/2024 09:17 am ---------------------------------------------------------------------- Performed By  Attending:        Noralee Space MD        Ref. Address:     [address]                                                             [place], [place]                                                             [address]  Performed By:     Reinaldo Raddle            [facility]  Referred By:      [facility] ---------------------------------------------------------------------- Orders  #  Description                           Code        Ordered By  1  [place] MFM FETAL BPP WO NON               76819.01    YU FANG     STRESS  2  [place] MFM FETAL BPP WO NST               21308.6     YU FANG     ADDL GESTATION ----------------------------------------------------------------------  #  Order #                     Accession #                Episode #  1  [id number]                   [id number]                 [id number]  2  [id number]                   [id number]                 [id number] ---------------------------------------------------------------------- Indications  Twin pregnancy, di/di, third trimester         O30.043  Obesity complicating pregnancy, third          O99.213  trimester (BMI 59)  Hypertension - Chronic/Pre-existing            O10.019  (labetalol)  Negative AFP  Previous cesarean delivery, antepartum         O34.219  Echogenic intracardiac focus of the heart      O35.8XX0  (EIF)  Pyelectasis of fetus on prenatal ultrasound    O28.3  (twin A) resolved  [redacted] weeks gestation of pregnancy                Z3A.35 ---------------------------------------------------------------------- Fetal Evaluation (Fetus A)  Num Of Fetuses:         2  Fetal Heart Rate(bpm):  167  Cardiac Activity:       Observed  Fetal Lie:  Lower Rt Fetus  Presentation:           Cephalic  Placenta:               Posterior  P. Cord Insertion:      Previously seen  Membrane Desc:      Dividing Membrane seen - Dichorionic.  Amniotic Fluid  AFI FV:      Within normal limits                              Largest Pocket(cm)                              4.22 ---------------------------------------------------------------------- Biophysical Evaluation (Fetus A)  Amniotic F.V:   Within normal limits       F. Tone:        Observed  F. Movement:    Observed                   Score:          8/8  F. Breathing:   Observed  ---------------------------------------------------------------------- Biometry (Fetus A)  LV:          8  mm ---------------------------------------------------------------------- OB History  Gravidity:    3         Term:   2        Prem:   0        SAB:   0  TOP:          0       Ectopic:  0        Living: 2 ---------------------------------------------------------------------- Gestational Age (Fetus A)  LMP:           36w 1d        Date:  05/14/23                   EDD:   02/18/24  Best:          Consuello Closs 3d     Det. By:  Marcella Dubs         EDD:   02/23/24                                      (07/01/23) ---------------------------------------------------------------------- Anatomy (Fetus A)  Ventricles:            Appears normal         Stomach:                Appears normal, left                                                                        sided  Diaphragm:             Appears normal         Bladder:                Appears normal ---------------------------------------------------------------------- Fetal Evaluation (Fetus B)  Num Of Fetuses:         2  Fetal Heart Rate(bpm):  169  Cardiac Activity:       Observed  Fetal Lie:              Upper LT Fetus  Presentation:           Cephalic  Placenta:               Posterior  P. Cord Insertion:      Previously seen  Membrane Desc:      Dividing Membrane seen - Dichorionic.  Amniotic Fluid  AFI FV:      Within normal limits                              Largest Pocket(cm)                              4.32 ---------------------------------------------------------------------- Biophysical Evaluation (Fetus B)  Amniotic F.V:   Within normal limits       F. Tone:        Observed  F. Movement:    Observed                   Score:          8/8  F. Breathing:   Observed ---------------------------------------------------------------------- Gestational Age (Fetus B)  LMP:           36w 1d        Date:  05/14/23                   EDD:   02/18/24  Best:          Consuello Closs 3d      Det. By:  Marcella Dubs         EDD:   02/23/24                                      (07/01/23) ---------------------------------------------------------------------- Anatomy (Fetus B)  Ventricles:            Appears normal         Kidneys:                Appear normal  Diaphragm:             Appears normal         Bladder:                Appears normal  Stomach:               Appears normal, left                         sided ---------------------------------------------------------------------- Impression  Dichorionic-amniotic fluid pregnancy.  Pregravid BMI 59.  Chronic pretension.  Patient reports she takes labetalol 200  mg twice daily.  She had forgotten to take her today's  morning dose.  Blood pressures today at our office were  146/85 and 124/84 mmHg.  She complains of mild headache,  but otherwise, does not have signs and symptoms of severe  features of preeclampsia.  Obstetrical history significant for 2 term cesarean deliveries.  Twin A: Lower fetus, maternal right, posterior placenta,  cephalic presentation, female fetus.  Amniotic fluid is normal  and good fetal activity seen.  Antenatal testing is reassuring.  BPP 8/8.  Twin B: Upper  fetus, maternal left, cephalic presentation,  posterior placenta, female fetus. Amniotic fluid is normal and  good fetal activity seen.  Antenatal testing is reassuring.  BPP  8/8. ---------------------------------------------------------------------- Recommendations  -BPP next week.  -Patient is scheduled to undergo repeat cesarean delivery on  02/04/2024. ----------------------------------------------------------------------                 Noralee Space, MD Electronically Signed Final Report   01/22/2024 10:24 am ----------------------------------------------------------------------   [place] MFM FETAL BPP WO NST ADDL GESTATION Result Date: 01/22/2024 ----------------------------------------------------------------------  OBSTETRICS REPORT                       (Signed  Final 01/22/2024 10:24 am) ---------------------------------------------------------------------- Patient Info  ID #:       [id number]                          D.O.B.:  1995-12-16 (27 yrs)(F)  Name:       Brandy Brock                Visit Date: 01/22/2024 09:17 am ---------------------------------------------------------------------- Performed By  Attending:        Noralee Space MD        Ref. Address:     [address]                                                             [place], [place]                                                             [address]  Performed By:     Reinaldo Raddle            [facility]  Referred By:      [facility] ---------------------------------------------------------------------- Orders  #  Description                           Code        Ordered By  1  [place] MFM FETAL BPP WO NON               76819.01    YU FANG     STRESS  2  [place] MFM FETAL BPP WO NST               40981.1     YU FANG     ADDL GESTATION ----------------------------------------------------------------------  #  Order #                     Accession #                Episode #  1  [id number]                   [id number]                 [id number]  2  [id number]                   [id number]                 [id number] ---------------------------------------------------------------------- Indications  Twin pregnancy, di/di, third trimester         O30.043  Obesity complicating pregnancy, third          O99.213  trimester (BMI 59)  Hypertension - Chronic/Pre-existing            O10.019  (labetalol)  Negative AFP  Previous cesarean delivery, antepartum         O34.219  Echogenic intracardiac focus of the heart      O35.8XX0  (EIF)  Pyelectasis of fetus on prenatal  ultrasound    O28.3  (twin A) resolved  [redacted] weeks gestation of pregnancy                Z3A.35 ---------------------------------------------------------------------- Fetal Evaluation (Fetus A)  Num Of Fetuses:         2  Fetal Heart Rate(bpm):  167  Cardiac Activity:       Observed  Fetal Lie:              Lower Rt Fetus  Presentation:           Cephalic  Placenta:               Posterior  P. Cord Insertion:      Previously seen  Membrane Desc:      Dividing Membrane seen - Dichorionic.  Amniotic Fluid  AFI FV:      Within normal limits                              Largest Pocket(cm)                              4.22 ---------------------------------------------------------------------- Biophysical Evaluation (Fetus A)  Amniotic F.V:   Within normal limits       F. Tone:        Observed  F. Movement:    Observed                   Score:  8/8  F. Breathing:   Observed ---------------------------------------------------------------------- Biometry (Fetus A)  LV:          8  mm ---------------------------------------------------------------------- OB History  Gravidity:    3         Term:   2        Prem:   0        SAB:   0  TOP:          0       Ectopic:  0        Living: 2 ---------------------------------------------------------------------- Gestational Age (Fetus A)  LMP:           36w 1d        Date:  05/14/23                   EDD:   02/18/24  Best:          Consuello Closs 3d     Det. ByMarcella Dubs         EDD:   02/23/24                                      (07/01/23) ---------------------------------------------------------------------- Anatomy (Fetus A)  Ventricles:            Appears normal         Stomach:                Appears normal, left                                                                        sided  Diaphragm:             Appears normal         Bladder:                Appears normal ---------------------------------------------------------------------- Fetal Evaluation (Fetus B)  Num Of  Fetuses:         2  Fetal Heart Rate(bpm):  169  Cardiac Activity:       Observed  Fetal Lie:              Upper LT Fetus  Presentation:           Cephalic  Placenta:               Posterior  P. Cord Insertion:      Previously seen  Membrane Desc:      Dividing Membrane seen - Dichorionic.  Amniotic Fluid  AFI FV:      Within normal limits                              Largest Pocket(cm)                              4.32 ---------------------------------------------------------------------- Biophysical Evaluation (Fetus B)  Amniotic F.V:   Within normal limits       F. Tone:        Observed  F. Movement:    Observed  Score:          8/8  F. Breathing:   Observed ---------------------------------------------------------------------- Gestational Age (Fetus B)  LMP:           36w 1d        Date:  05/14/23                   EDD:   02/18/24  Best:          Consuello Closs 3d     Det. By:  Marcella Dubs         EDD:   02/23/24                                      (07/01/23) ---------------------------------------------------------------------- Anatomy (Fetus B)  Ventricles:            Appears normal         Kidneys:                Appear normal  Diaphragm:             Appears normal         Bladder:                Appears normal  Stomach:               Appears normal, left                         sided ---------------------------------------------------------------------- Impression  Dichorionic-amniotic fluid pregnancy.  Pregravid BMI 59.  Chronic pretension.  Patient reports she takes labetalol 200  mg twice daily.  She had forgotten to take her today's  morning dose.  Blood pressures today at our office were  146/85 and 124/84 mmHg.  She complains of mild headache,  but otherwise, does not have signs and symptoms of severe  features of preeclampsia.  Obstetrical history significant for 2 term cesarean deliveries.  Twin A: Lower fetus, maternal right, posterior placenta,  cephalic presentation, female fetus.  Amniotic  fluid is normal  and good fetal activity seen.  Antenatal testing is reassuring.  BPP 8/8.  Twin B: Upper fetus, maternal left, cephalic presentation,  posterior placenta, female fetus. Amniotic fluid is normal and  good fetal activity seen.  Antenatal testing is reassuring.  BPP  8/8. ---------------------------------------------------------------------- Recommendations  -BPP next week.  -Patient is scheduled to undergo repeat cesarean delivery on  02/04/2024. ----------------------------------------------------------------------                 Noralee Space, MD Electronically Signed Final Report   01/22/2024 10:24 am ----------------------------------------------------------------------   [place] MFM FETAL BPP WO NON STRESS Result Date: 01/15/2024 ----------------------------------------------------------------------  OBSTETRICS REPORT                       (Signed Final 01/15/2024 02:35 pm) ---------------------------------------------------------------------- Patient Info  ID #:       [id number]                          D.O.B.:  [birth year] (27 yrs)(F)  Name:       RAYMA HEGG                Visit Date: 01/15/2024 09:25 am ---------------------------------------------------------------------- Performed By  Attending:        Ma Rings MD  Ref. Address:     [address]                                                             [place], [place]                                                             [address]  Performed By:     Dennis Bast RDMS      Location[facility]  Referred By:      [facility] ---------------------------------------------------------------------- Orders  #  Description                           Code        Ordered By  1  [place]  MFM FETAL BPP WO NON               76819.01    YU FANG     STRESS  2  [place] MFM FETAL BPP WO NST               69629.5     YU FANG     ADDL GESTATION ----------------------------------------------------------------------  #  Order #                     Accession #                Episode #  1  [id number]                   [id number]                 [id number]  2  [id number]                   [id number]  [id number] ---------------------------------------------------------------------- Indications  Twin pregnancy, di/di, third trimester         O30.043  Obesity complicating pregnancy, third          O99.213  trimester (BMI 59)  Hypertension - Chronic/Pre-existing            O10.019  (labetalol)  Negative AFP  Previous cesarean delivery, antepartum         O34.219  Echogenic intracardiac focus of the heart      O35.8XX0  (EIF)  Pyelectasis of fetus on prenatal ultrasound    O28.3  (twin A) resolved  [redacted] weeks gestation of pregnancy                Z3A.34 ---------------------------------------------------------------------- Vital Signs  BP:          138/74 ---------------------------------------------------------------------- Fetal Evaluation (Fetus A)  Num Of Fetuses:         2  Fetal Heart Rate(bpm):  134  Cardiac Activity:       Observed  Presentation:           Breech  Placenta:               Posterior  Membrane Desc:      Dividing Membrane seen - Dichorionic.  Amniotic Fluid  AFI FV:      Within normal limits                              Largest Pocket(cm)                              4.03 ---------------------------------------------------------------------- Biophysical Evaluation (Fetus A)  Amniotic F.V:   Within normal limits       F. Tone:        Observed  F. Movement:    Observed                   Score:          8/8  F. Breathing:   Observed ---------------------------------------------------------------------- OB History  Gravidity:    3         Term:   2        Prem:   0        SAB:   0  TOP:           0       Ectopic:  0        Living: 2 ---------------------------------------------------------------------- Gestational Age (Fetus A)  LMP:           35w 1d        Date:  05/14/23                   EDD:   02/18/24  Best:          34w 3d     Det. ByMarcella Dubs         EDD:   02/23/24                                      (07/01/23) ---------------------------------------------------------------------- Anatomy (Fetus A)  Heart:                 Previously seen        Kidneys:  Appear normal  Stomach:               Appears normal, left   Bladder:                Appears normal                         sided ---------------------------------------------------------------------- Fetal Evaluation (Fetus B)  Num Of Fetuses:         2  Fetal Heart Rate(bpm):  155  Cardiac Activity:       Observed  Presentation:           Cephalic  Placenta:               Posterior  Membrane Desc:      Dividing Membrane seen - Dichorionic.  Amniotic Fluid  AFI FV:      Within normal limits                              Largest Pocket(cm)                              3.38 ---------------------------------------------------------------------- Biophysical Evaluation (Fetus B)  Amniotic F.V:   Within normal limits       F. Tone:        Observed  F. Movement:    Observed                   Score:          8/8  F. Breathing:   Observed ---------------------------------------------------------------------- Biometry (Fetus B)  BPD:       0.3  mm     G. Age:  9w 4d         < 1  % ---------------------------------------------------------------------- Gestational Age (Fetus B)  LMP:           35w 1d        Date:  05/14/23                   EDD:   02/18/24  U/S Today:     9w 4d                                         EDD:   08/15/24  Best:          34w 3d     Det. ByMarcella Dubs         EDD:   02/23/24                                      (07/01/23) ---------------------------------------------------------------------- Cervix Uterus Adnexa   Cervix  Not visualized (advanced GA >24wks)  Uterus  No abnormality visualized.  Right Ovary  Not visualized.  Left Ovary  Not visualized.  Cul De Sac  No free fluid seen.  Adnexa  No abnormality visualized ---------------------------------------------------------------------- Comments  This patient has been followed due to a dichorionic,  diamniotic twin gestation, maternal obesity with a BMI of 59,  and chronic hypertension.  She denies any problems since  her last exam.  A BPP performed today was 8 out of 8 for both twin A and  twin B.  There was normal amniotic fluid noted around both fetuses.  She already has a C-section scheduled on February 04, 2024.  She will return in 1 week for another BPP. ----------------------------------------------------------------------                  Ma Rings, MD Electronically Signed Final Report   01/15/2024 02:35 pm ----------------------------------------------------------------------   [place] MFM FETAL BPP WO NST ADDL GESTATION Result Date: 01/15/2024 ----------------------------------------------------------------------  OBSTETRICS REPORT                       (Signed Final 01/15/2024 02:35 pm) ---------------------------------------------------------------------- Patient Info  ID #:       [id number]                          D.O.B.:  Dec 19, 1995 (27 yrs)(F)  Name:       Brandy Brock                Visit Date: 01/15/2024 09:25 am ---------------------------------------------------------------------- Performed By  Attending:        Ma Rings MD         Ref. Address:     [address]                                                             [place], [place]                                                             [address]  Performed By:     Dennis Bast RDMS      [facility]  Referred By:      [facility] ---------------------------------------------------------------------- Orders  #  Description  Code        Ordered By  1  [place] MFM FETAL BPP WO NON               [id number]    YU FANG     STRESS  2  [place] MFM FETAL BPP WO NST               76819.1     YU FANG     ADDL GESTATION ----------------------------------------------------------------------  #  Order #                     Accession #                Episode #  1  [id number]                   [id number]                 [id number]  2  [id number]                   [id number]                 [id number] ---------------------------------------------------------------------- Indications  Twin pregnancy, di/di, third trimester         O30.043  Obesity complicating pregnancy, third          O99.213  trimester (BMI 59)  Hypertension - Chronic/Pre-existing            O10.019  (labetalol)  Negative AFP  Previous cesarean delivery, antepartum         O34.219  Echogenic intracardiac focus of the heart      O35.8XX0  (EIF)  Pyelectasis of fetus on prenatal ultrasound    O28.3  (twin A) resolved  [redacted] weeks gestation of pregnancy                Z3A.34 ---------------------------------------------------------------------- Vital Signs  BP:          138/74 ---------------------------------------------------------------------- Fetal Evaluation (Fetus A)  Num Of Fetuses:         2  Fetal Heart Rate(bpm):  134  Cardiac Activity:       Observed  Presentation:           Breech  Placenta:               Posterior  Membrane Desc:      Dividing Membrane seen - Dichorionic.  Amniotic Fluid  AFI FV:      Within normal limits                              Largest Pocket(cm)                              4.03 ---------------------------------------------------------------------- Biophysical Evaluation (Fetus A)  Amniotic F.V:   Within normal limits       F. Tone:        Observed  F.  Movement:    Observed                   Score:          8/8  F. Breathing:   Observed ---------------------------------------------------------------------- OB History  Gravidity:    3         Term:   2        Prem:   0  SAB:   0  TOP:          0       Ectopic:  0        Living: 2 ---------------------------------------------------------------------- Gestational Age (Fetus A)  LMP:           35w 1d        Date:  05/14/23                   EDD:   02/18/24  Best:          34w 3d     Det. ByMarcella Dubs         EDD:   02/23/24                                      (07/01/23) ---------------------------------------------------------------------- Anatomy (Fetus A)  Heart:                 Previously seen        Kidneys:                Appear normal  Stomach:               Appears normal, left   Bladder:                Appears normal                         sided ---------------------------------------------------------------------- Fetal Evaluation (Fetus B)  Num Of Fetuses:         2  Fetal Heart Rate(bpm):  155  Cardiac Activity:       Observed  Presentation:           Cephalic  Placenta:               Posterior  Membrane Desc:      Dividing Membrane seen - Dichorionic.  Amniotic Fluid  AFI FV:      Within normal limits                              Largest Pocket(cm)                              3.38 ---------------------------------------------------------------------- Biophysical Evaluation (Fetus B)  Amniotic F.V:   Within normal limits       F. Tone:        Observed  F. Movement:    Observed                   Score:          8/8  F. Breathing:   Observed ---------------------------------------------------------------------- Biometry (Fetus B)  BPD:       0.3  mm     G. Age:  9w 4d         < 1  % ---------------------------------------------------------------------- Gestational Age (Fetus B)  LMP:           35w 1d        Date:  05/14/23                   EDD:   02/18/24  U/S Today:     9w 4d  EDD:   08/15/24  Best:          34w 3d     Det. ByMarcella Dubs         EDD:   02/23/24                                      (07/01/23) ---------------------------------------------------------------------- Cervix Uterus Adnexa  Cervix  Not visualized (advanced GA >24wks)  Uterus  No abnormality visualized.  Right Ovary  Not visualized.  Left Ovary  Not visualized.  Cul De Sac  No free fluid seen.  Adnexa  No abnormality visualized ---------------------------------------------------------------------- Comments  This patient has been followed due to a dichorionic,  diamniotic twin gestation, maternal obesity with a BMI of 59,  and chronic hypertension.  She denies any problems since  her last exam.  A BPP performed today was 8 out of 8 for both twin A and  twin B.  There was normal amniotic fluid noted around both fetuses.  She already has a C-section scheduled on February 04, 2024.  She will return in 1 week for another BPP. ----------------------------------------------------------------------                  Ma Rings, MD Electronically Signed Final Report   01/15/2024 02:35 pm ----------------------------------------------------------------------      Assessment and Plan:   Problem List Items Addressed This Visit       Other   Wound dehiscence, cesarean, postpartum condition - Primary   Patient had already had pain pill prior to arrival. I removed packing, serous discharge present but no signs of infection. Offered to instill lidocaine into defect, patient amenable and approximately 10 cc of 1% lidocaine without epi was instilled and allowed to take effect over several minutes. Then with assistance of wound vac reps the wound vac was placed, device was activated and adequate suction was generated. Patient has home health already set up to assist with wound vac care and exchange.        Routine preventative health maintenance measures emphasized. Please refer to After Visit  Summary for other counseling recommendations.   Return in about 2 weeks (around 02/26/2024) for PP check.    Total face-to-face time with patient:  35  minutes.  Over 50% of encounter was spent on counseling and coordination of care.   Venora Maples, MD/MPH Attending Family Medicine Physician, [facility] for [facility], [facility]

## 2024-02-13 ENCOUNTER — Ambulatory Visit (HOSPITAL_COMMUNITY): Payer: Self-pay

## 2024-02-13 DIAGNOSIS — Z8759 Personal history of other complications of pregnancy, childbirth and the puerperium: Secondary | ICD-10-CM | POA: Insufficient documentation

## 2024-02-13 DIAGNOSIS — O900 Disruption of cesarean delivery wound: Secondary | ICD-10-CM | POA: Insufficient documentation

## 2024-02-13 NOTE — Lactation Note (Signed)
 This note was copied from a baby's chart.  NICU Lactation Consultation Note  Patient Name: Brandy Brock ZOXWR'U Date: 02/13/2024 Age:28 wk.o.  Reason for consult: Weekly NICU follow-up; NICU baby; Multiple gestation; Early term 72-38.6wks; Other (Comment) (cHTN, Pre-E)  SUBJECTIVE Visited with family of 68 63/50 weeks old AGA NICU twin female "Brandy Brock"; [name] is a P3 and reported that she continues pumping consistently day and night, her supply remains slightly BNL for twins. She voiced she tried taking babies to breast but due to large nipple diameter (she hasn't bought the # 28 NS yet) babies' mouths are still too small to try latching at the breast. She was experiencing some discomfort and "peeling" of her nipples. Provided breast shells for daytime only and asked her to use her EBM/nipple butter after pumping and to let her nipples air dry at night. [name] is taking both babies home today. Reviewed discharge education, strategies to increase supply and the importance of consistent pumping after feedings/attempts at the breast to protect her supply while babies learn to breastfeed. She politely declined a referral for LC OP but has their contact info in case they need to reach out; she'll be F/U with her local [facility] office for lactation care. No other support person at this time. All questions and concerns answered, family to contact [facility] services PRN.  OBJECTIVE Infant data: Mother's Current Feeding Choice: Breast Milk and Formula  O2 Device: Room Air  Infant feeding assessment IDFTS - Readiness: 1 IDFTS - Quality: 2   Maternal data: [id number] C-Section, Low Transverse Pumping frequency: 7-8 times/24 hours Pumped volume: 90 mL  WIC Program: Yes WIC Referral Sent?: No (Mom has an appt at [facility] tomorrow 3/25) What county?: [place] Pump: Hands Free, Personal, [facility] Pump (Medela wearable pump)  ASSESSMENT Infant: Feeding Status: Ad lib Feeding method:  Bottle Nipple Type: Nfant Extra Slow Flow (gold)  Maternal: Milk volume: Low (for twins)  INTERVENTIONS/PLAN Interventions: Interventions: Breast feeding basics reviewed; Coconut oil; Shells; DEBP; Education Discharge Education: Outpatient recommendation Tools: Shells  Plan: Consult Status: Complete   Rosevelt Luu Venetia Constable 02/13/2024, 11:17 AM

## 2024-02-13 NOTE — Lactation Note (Signed)
 This note was copied from a baby's chart.  NICU Lactation Consultation Note  Patient Name: Brandy Brock ZOXWR'U Date: 02/13/2024 Age:28 wk.o.  Reason for consult: Weekly NICU follow-up; NICU baby; Multiple gestation; Early term 72-38.6wks; Other (Comment) (cHTN, Pre-E)  SUBJECTIVE Visited with family of 58 34/85 weeks old AGA NICU twin female "Brandy Brock"; Brandy Brock is a P3 and reported that she continues pumping consistently day and night, her supply remains slightly BNL for twins. She voiced she tried taking babies to breast but due to large nipple diameter (she hasn't bought the # 28 NS yet) babies' mouths are still too small to try latching at the breast. She was experiencing some discomfort and "peeling" of her nipples. Provided breast shells for daytime only and asked her to use her EBM/nipple butter after pumping and to let her nipples air dry at night. Brandy Brock is taking both babies home today. Reviewed discharge education, strategies to increase supply and the importance of consistent pumping after feedings/attempts at the breast to protect her supply while babies learn to breastfeed. She politely declined a referral for LC OP but has their contact info in case they need to reach out; she'll be F/U with her local [facility] office for lactation care. No other support person at this time. All questions and concerns answered, family to contact [facility] services PRN.  OBJECTIVE Infant data: Mother's Current Feeding Choice: Breast Milk and Formula  O2 Device: Room Air  Infant feeding assessment IDFTS - Readiness: 1 IDFTS - Quality: 2   Maternal data: [id number] C-Section, Low Transverse Pumping frequency: 7-8 times/24 hours Pumped volume: 90 mL  WIC Program: Yes WIC Referral Sent?: No (WIC appt tomorrow 3/25) What county?: [place] Pump: Hands Free, Personal, WIC Pump (Medela wearable pump)  ASSESSMENT Infant: Feeding Status: Ad lib Feeding method: Bottle Nipple Type: Dr. Irving Burton  Preemie  Maternal: Milk volume: Low (for twins)  INTERVENTIONS/PLAN Interventions: Interventions: Breast feeding basics reviewed; Coconut oil; DEBP; Education; Shells Discharge Education: Outpatient recommendation Tools: Shells  Plan: Consult Status: Complete   Brandy Brock S Brandy Brock 02/13/2024, 11:11 AM

## 2024-02-13 NOTE — Assessment & Plan Note (Signed)
 Patient had already had pain pill prior to arrival. I removed packing, serous discharge present but no signs of infection. Offered to instill lidocaine into defect, patient amenable and approximately 10 cc of 1% lidocaine without epi was instilled and allowed to take effect over several minutes. Then with assistance of wound vac reps the wound vac was placed, device was activated and adequate suction was generated. Patient has home health already set up to assist with wound vac care and exchange.

## 2024-02-17 ENCOUNTER — Other Ambulatory Visit: Payer: Self-pay

## 2024-02-17 ENCOUNTER — Ambulatory Visit: Admitting: *Deleted

## 2024-02-17 VITALS — BP 132/67 | HR 100 | Ht 67.0 in | Wt 360.1 lb

## 2024-02-17 DIAGNOSIS — R319 Hematuria, unspecified: Secondary | ICD-10-CM | POA: Diagnosis not present

## 2024-02-17 DIAGNOSIS — R300 Dysuria: Secondary | ICD-10-CM | POA: Diagnosis not present

## 2024-02-17 DIAGNOSIS — L7682 Other postprocedural complications of skin and subcutaneous tissue: Secondary | ICD-10-CM

## 2024-02-17 DIAGNOSIS — T8130XA Disruption of wound, unspecified, initial encounter: Secondary | ICD-10-CM

## 2024-02-17 LAB — POCT URINALYSIS DIP (DEVICE)
Bilirubin Urine: NEGATIVE
Glucose, UA: NEGATIVE mg/dL
Ketones, ur: NEGATIVE mg/dL
Nitrite: NEGATIVE
Protein, ur: NEGATIVE mg/dL
Specific Gravity, Urine: 1.025 (ref 1.005–1.030)
Urobilinogen, UA: 0.2 mg/dL (ref 0.0–1.0)
pH: 6.5 (ref 5.0–8.0)

## 2024-02-17 MED ORDER — OXYCODONE HCL 5 MG PO TABS: 5.0000 mg | ORAL_TABLET | ORAL | 0 refills | Status: DC | PRN

## 2024-02-17 MED ORDER — IBUPROFEN 600 MG PO TABS
600.0000 mg | ORAL_TABLET | Freq: Four times a day (QID) | ORAL | 0 refills | Status: AC | PRN
Start: 2024-02-17 — End: ?

## 2024-02-17 MED ORDER — NITROFURANTOIN MONOHYD MACRO 100 MG PO CAPS: 100.0000 mg | ORAL_CAPSULE | Freq: Two times a day (BID) | ORAL | 0 refills | Status: AC

## 2024-02-17 NOTE — Addendum Note (Signed)
 Addended by: Edd Arbour on: 02/17/2024 09:05 PM   Modules accepted: Orders

## 2024-02-17 NOTE — Progress Notes (Signed)
 Here for possible UTI c/o chills yesterday and sometime when she urinates has burning type pain. Clean catch urine obtained. Shows hematuria and leukocytes but no nitrites. Will send urine for culture. Due to symptoms and results will start macrobid and explained may change depending of results of urine culture. She asked for refill of ibuprofen and oxycodone. I explained I can refill ibuprofen but will need provider to approve oxycodone. She asked me to send to Edd Arbour, CNM for review.  Nancy Fetter

## 2024-02-19 ENCOUNTER — Telehealth: Payer: Self-pay | Admitting: *Deleted

## 2024-02-20 ENCOUNTER — Encounter: Payer: Self-pay | Admitting: Obstetrics and Gynecology

## 2024-02-20 LAB — URINE CULTURE

## 2024-02-25 ENCOUNTER — Ambulatory Visit: Admitting: Certified Nurse Midwife

## 2024-03-03 ENCOUNTER — Telehealth: Payer: Self-pay

## 2024-03-03 NOTE — Telephone Encounter (Signed)
 Abraham Hoffmann, RN from [employer] Called RN line regarding patient's wound vac. RN stated that wound is healing and wound vac is not sealing anymore, has no more suction to hold down anymore. RN request to have new wound care orders: Cleanse wound with wound cleanser, Pat dry with clean dry gauze, Pack tunnel with 1 inch packing gauze strip,  Cover with gauze dressing,  Tape gauze dressing in place.  Change dressing daily and PRN.    Abraham Hoffmann, RN can be called back at [phone]   [address], RN back regarding patient's wound orders. Abraham Hoffmann, RN reports that tunnel is 3 cm now. Deborha Falls, CNM provided verbal orders to Abraham Hoffmann, [place] for new dressing orders.   Moira Andrews, RN

## 2024-03-09 ENCOUNTER — Other Ambulatory Visit: Payer: Self-pay | Admitting: Certified Nurse Midwife

## 2024-03-09 ENCOUNTER — Telehealth: Payer: Self-pay | Admitting: *Deleted

## 2024-03-09 DIAGNOSIS — I10 Essential (primary) hypertension: Secondary | ICD-10-CM

## 2024-03-09 NOTE — Telephone Encounter (Signed)
 Received a voice message from [facility] re: wound vac and that they are trying to speak with Dr. Adriana Hopping and need pending information to avoid discontinuation of wound vac. State they need to know nutritional status. Reference number [id number]. I called [employer] to clarify what information is needed and they confirmed they need to know if she is on a special diet or any protein supplements or other.  Per chart no special diet or supplement orders noted. I called [facility] and she confirms she is on a regular diet , no supplements and is no longer using wound vac. I called [employer] and informed them of no special diet and patient states no longer using. Alejandra Hurst

## 2024-03-10 ENCOUNTER — Other Ambulatory Visit: Payer: Self-pay

## 2024-03-10 ENCOUNTER — Ambulatory Visit: Admitting: Certified Nurse Midwife

## 2024-03-10 DIAGNOSIS — T8130XD Disruption of wound, unspecified, subsequent encounter: Secondary | ICD-10-CM | POA: Diagnosis not present

## 2024-03-10 DIAGNOSIS — T8130XA Disruption of wound, unspecified, initial encounter: Secondary | ICD-10-CM

## 2024-03-10 NOTE — Progress Notes (Signed)
 Post Partum Visit Note  Brandy Brock is a 28 y.o. [phone] female who presents for a postpartum visit. She is 6 weeks postpartum following a repeat cesarean section.  I have fully reviewed the prenatal and intrapartum course. The delivery was at [redacted]w[redacted]d gestational weeks.  Anesthesia: spinal. Postpartum course has been complicated by a wound dehiscence. Babies are doing well and are home from their NICU stay. Babies are feeding by both breast and bottle - Similac Neosure. Bleeding no bleeding. Bowel function is normal. Bladder function is normal. Patient is sexually active. Contraception method is none. Postpartum depression screening: negative.   Upstream - 03/10/24 1458       Pregnancy Intention Screening   Does the patient want to become pregnant in the next year? No    Does the patient's partner want to become pregnant in the next year? No    Would the patient like to discuss contraceptive options today? N/A      Contraception Wrap Up   Current Method Female Sterilization    End Method Female Sterilization    Contraception Counseling Provided No    How was the end contraceptive method provided? N/A            The pregnancy intention screening data noted above was reviewed. Potential methods of contraception were discussed. The patient elected to proceed with Female Sterilization.   Edinburgh Postnatal Depression Scale - 03/10/24 1203       Edinburgh Postnatal Depression Scale:  In the Past 7 Days   I have been able to laugh and see the funny side of things. 0    I have looked forward with enjoyment to things. 1    I have blamed myself unnecessarily when things went wrong. 2    I have been anxious or worried for no good reason. 2    I have felt scared or panicky for no good reason. 1    Things have been getting on top of me. 2    I have been so unhappy that I have had difficulty sleeping. 1    I have felt sad or miserable. 1    I have been so unhappy that I have been  crying. 1    The thought of harming myself has occurred to me. 0    Edinburgh Postnatal Depression Scale Total 11            Health Maintenance Due  Topic Date Due   Pneumococcal Vaccine 14-74 Years old (1 of 2 - PCV) Never done   COVID-19 Vaccine (3 - 2024-25 season) 07/13/2023   The following portions of the patient's history were reviewed and updated as appropriate: allergies, current medications, past family history, past medical history, past social history, past surgical history, and problem list.  Review of Systems Pertinent items noted in HPI and remainder of comprehensive ROS otherwise negative.  Objective:  BP 124/83   Pulse 85   Wt (!) 356 lb (161.5 kg)   LMP 05/14/2023 (Approximate)   Breastfeeding Yes   BMI 55.76 kg/m    Constitutional: Alert, oriented female in no physical distress.  HEENT: PERRLA Skin: normal color and turgor, no rash Cardiovascular: normal rate & rhythm Respiratory: normal effort, no problems with respiration noted GI: Abd soft, non-tender MS: Extremities nontender, no edema, normal ROM Neurologic: Alert and oriented x 4.  GU: no CVA tenderness Pelvic: exam deferred Incision: well-approximated without exudate, redness or edema.    Assessment:  1. Postpartum care  and examination (Primary) - Recovering well now  2. Wound dehiscence - Wound nurse comes to her house twice weekly, down to only 3cm of wound packing left.  - Pain is now well controlled with OTC meds, was able to stop taking oxycodone .   Plan:   Essential components of care per ACOG recommendations:  1.  Mood and well being: Patient with mild depression (baby blues) screening today. Reviewed local resources for support.  - Patient tobacco use? No.   - hx of drug use? No.    2. Infant care and feeding:  -Patient currently breastmilk feeding? Yes. Reviewed importance of draining breast regularly to support lactation.  -Social determinants of health (SDOH) reviewed in  EPIC. No concerns  3. Sexuality, contraception and birth spacing - Patient does not want a pregnancy in the next year.  Desired family size is 4 children.  - Patient had Female Sterilization at delivery  4. Sleep and fatigue -Encouraged family/partner/community support of 4 hrs of uninterrupted sleep to help with mood and fatigue  5. Physical Recovery  - Discussed patients delivery and complications. She describes her labor as mixed. - Patient had a  repeat Cesarean with wound dehiscence after .  - Patient is safe to resume physical and sexual activity  6.  Health Maintenance - HM due items addressed Yes - Last pap smear  Diagnosis  Date Value Ref Range Status  04/22/2022   Final   - Negative for intraepithelial lesion or malignancy (NILM)   Pap smear not done at today's visit.  -Breast Cancer screening indicated? No.   7. Chronic Disease/Pregnancy Condition follow up: None - PCP follow up as needed  Derick Fleeting, CNM Center for [employer], [facility]

## 2024-03-16 ENCOUNTER — Ambulatory Visit: Payer: Self-pay | Admitting: Certified Nurse Midwife
